# Patient Record
Sex: Female | Born: 1945 | Race: White | Hispanic: Refuse to answer | Marital: Married | Smoking: Never smoker
Health system: Southern US, Community
[De-identification: ages and names within clinical notes are randomized; demographics above are authoritative.]

## PROBLEM LIST (undated history)

## (undated) DIAGNOSIS — G809 Cerebral palsy, unspecified: Secondary | ICD-10-CM

## (undated) DIAGNOSIS — M1990 Unspecified osteoarthritis, unspecified site: Secondary | ICD-10-CM

## (undated) DIAGNOSIS — G43909 Migraine, unspecified, not intractable, without status migrainosus: Secondary | ICD-10-CM

## (undated) DIAGNOSIS — I4891 Unspecified atrial fibrillation: Secondary | ICD-10-CM

## (undated) HISTORY — PX: TEMPOROMANDIBULAR JOINT ARTHROPLASTY: SUR76

## (undated) HISTORY — DX: Migraine, unspecified, not intractable, without status migrainosus: G43.909

## (undated) HISTORY — DX: Cerebral palsy, unspecified: G80.9

## (undated) HISTORY — PX: OTHER SURGICAL HISTORY: SHX169

---

## 1980-03-03 HISTORY — PX: TOTAL ABDOMINAL HYSTERECTOMY W/ BILATERAL SALPINGOOPHORECTOMY: SHX83

## 1987-03-04 HISTORY — PX: CHOLECYSTECTOMY: SHX55

## 2003-03-04 HISTORY — PX: OTHER SURGICAL HISTORY: SHX169

## 2003-06-11 DIAGNOSIS — E7800 Pure hypercholesterolemia, unspecified: Secondary | ICD-10-CM | POA: Insufficient documentation

## 2003-12-12 ENCOUNTER — Ambulatory Visit (HOSPITAL_COMMUNITY): Admission: RE | Admit: 2003-12-12 | Discharge: 2003-12-13 | Payer: Self-pay | Admitting: Surgery

## 2003-12-12 ENCOUNTER — Encounter (INDEPENDENT_AMBULATORY_CARE_PROVIDER_SITE_OTHER): Payer: Self-pay | Admitting: *Deleted

## 2007-03-01 ENCOUNTER — Encounter: Admission: RE | Admit: 2007-03-01 | Discharge: 2007-04-20 | Payer: Self-pay | Admitting: Internal Medicine

## 2010-07-19 NOTE — Op Note (Signed)
NAMESHANTOYA, Holly Mcconnell             ACCOUNT NO.:  [id number]   MEDICAL RECORD NO.:  [id number]          PATIENT TYPE:  OIB   LOCATION:  NA                           FACILITY:  [facility]   PHYSICIAN:  Velora Heckler, M.D.   DATE OF BIRTH:  1946/01/19   DATE OF PROCEDURE:  12/12/2003  DATE OF DISCHARGE:                                 OPERATIVE REPORT   PREOPERATIVE DIAGNOSIS:  Thyroid nodule.   POSTOPERATIVE DIAGNOSIS:  Thyroid nodule.   OPERATION PERFORMED:  Thyroid isthmusectomy.   SURGEON:  Velora Heckler, M.D.   ASSISTANT:  Gita Kudo, M.D.   ANESTHESIA:  General.   ESTIMATED BLOOD LOSS:  Minimal.   PREPARATION:  Betadine.   COMPLICATIONS:  None.   INDICATIONS FOR PROCEDURE:  The patient is a 65 year old white female seen  at the request of Dr. Doran Clay for thyroid nodule.  This has been  present for at least one year.  It has gradually increased in size.  The  patient has been on thyroid hormone replacement.  Thyroid ultrasound  demonstrated a 2.7 cm solid lesion in the thyroid isthmus.  The patient has  developed mild compressive symptoms.  She desires excision.   DESCRIPTION OF PROCEDURE:  The procedure was done in operating room #1 at  the [facility]. [facility].  The patient was brought to the  operating room and placed in supine position on the operating room table.  Following administration of general anesthesia, the patient was positioned  and then prepped and draped in the usual strict aseptic fashion.  After  ascertaining that an adequate level of anesthesia had been obtained, a  Kocher incision was made with a #15 blade.  Dissection was carried down  through subcutaneous tissues and platysma.  Hemostasis was obtained with the  electrocautery.  Skin flaps were developed cephalad and caudad.  A Mahorner  self-retaining retractor was placed for exposure.  Strap muscles were  incised in the midline.  Nodule was located centrally in the middle  portion  of the isthmus.  Inferior venous tributaries to the isthmus were divided  between small Ligaclips.  The inferior edge of the isthmus was defined from  the left lobe to the right lobe.  Dissection was continued behind the  isthmus elevating it off of the underlying trachea.  Superior margin of the  isthmus was also defined by dissecting it out with the electrocautery.  There was no pyramidal lobe.  Dissection was carried behind the isthmus down  on the trachea providing for full mobilization of the nodule and surrounding  tissue.  Hemostats were placed two on the right, two on the left on either  side of the nodule so as to occlude the entire isthmus.  Using a 15 blade,  the isthmus was then excised along the hemostats.  A margin was obtained  around the nodule.  The entire isthmus was submitted with the large nodule  to pathology for frozen section.  Dr. Kieth Brightly confirmed a follicular  lesion which she favors being a hyperplastic nodule.  There is no sign of  malignancy.  Both the left and left and right lobes of the thyroid were  normal without further nodules or abnormality.  Therefore, the hemostats on  each lobe were suture ligated with 3-0 Vicryl suture ligatures.  Good  hemostasis was noted.  Surgicel was placed over the cut surface of the lobes  bilaterally.  Good hemostasis was achieved.  Strap muscles were  reapproximated in the midline with interrupted 3-0 Vicryl sutures.  Platysma  was closed with interrupted 3-0 Vicryl sutures.  Skin edges were  reapproximated with a running 4-0 Vicryl subcuticular suture.  Wound was  washed and dried and benzoin and Steri-Strips were applied.  Sterile  dressings were applied.  The patient was awakened from anesthesia and  brought to the recovery room in stable condition.  The patient tolerated the  procedure well.       TMG/MEDQ  D:  12/12/2003  T:  12/12/2003  Job:  [id number]   cc:   Al Decant. Janey Greaser, MD  [address]  [place]  [place] [address]  Fax: [phone]

## 2010-10-22 ENCOUNTER — Ambulatory Visit: Payer: BC Managed Care – PPO | Attending: Internal Medicine | Admitting: Physical Therapy

## 2010-10-22 DIAGNOSIS — R269 Unspecified abnormalities of gait and mobility: Secondary | ICD-10-CM | POA: Insufficient documentation

## 2010-10-22 DIAGNOSIS — R293 Abnormal posture: Secondary | ICD-10-CM | POA: Insufficient documentation

## 2010-10-22 DIAGNOSIS — IMO0001 Reserved for inherently not codable concepts without codable children: Secondary | ICD-10-CM | POA: Insufficient documentation

## 2010-10-28 ENCOUNTER — Ambulatory Visit: Payer: BC Managed Care – PPO | Admitting: Physical Therapy

## 2010-10-30 ENCOUNTER — Ambulatory Visit: Payer: BC Managed Care – PPO | Admitting: Physical Therapy

## 2010-11-06 ENCOUNTER — Ambulatory Visit
Payer: BC Managed Care – PPO | Attending: Internal Medicine | Admitting: Rehabilitative and Restorative Service Providers"

## 2010-11-06 DIAGNOSIS — R293 Abnormal posture: Secondary | ICD-10-CM | POA: Insufficient documentation

## 2010-11-06 DIAGNOSIS — R269 Unspecified abnormalities of gait and mobility: Secondary | ICD-10-CM | POA: Insufficient documentation

## 2010-11-06 DIAGNOSIS — IMO0001 Reserved for inherently not codable concepts without codable children: Secondary | ICD-10-CM | POA: Insufficient documentation

## 2010-11-08 ENCOUNTER — Ambulatory Visit: Payer: BC Managed Care – PPO | Admitting: Physical Therapy

## 2010-11-11 ENCOUNTER — Ambulatory Visit: Payer: BC Managed Care – PPO | Admitting: Physical Therapy

## 2010-11-13 ENCOUNTER — Ambulatory Visit: Payer: BC Managed Care – PPO | Admitting: Physical Therapy

## 2010-11-18 ENCOUNTER — Ambulatory Visit: Payer: BC Managed Care – PPO | Admitting: Physical Therapy

## 2010-11-20 ENCOUNTER — Ambulatory Visit: Payer: Self-pay | Admitting: Physical Therapy

## 2010-11-20 ENCOUNTER — Ambulatory Visit: Payer: BC Managed Care – PPO | Admitting: Physical Therapy

## 2010-11-25 ENCOUNTER — Ambulatory Visit: Payer: BC Managed Care – PPO | Admitting: Physical Therapy

## 2010-11-27 ENCOUNTER — Ambulatory Visit: Payer: BC Managed Care – PPO | Admitting: Physical Therapy

## 2010-12-04 ENCOUNTER — Ambulatory Visit: Payer: BC Managed Care – PPO | Attending: Internal Medicine | Admitting: Physical Therapy

## 2010-12-04 DIAGNOSIS — IMO0001 Reserved for inherently not codable concepts without codable children: Secondary | ICD-10-CM | POA: Insufficient documentation

## 2010-12-04 DIAGNOSIS — R269 Unspecified abnormalities of gait and mobility: Secondary | ICD-10-CM | POA: Insufficient documentation

## 2010-12-04 DIAGNOSIS — R293 Abnormal posture: Secondary | ICD-10-CM | POA: Insufficient documentation

## 2010-12-06 ENCOUNTER — Ambulatory Visit: Payer: BC Managed Care – PPO | Admitting: Physical Therapy

## 2010-12-09 ENCOUNTER — Ambulatory Visit: Payer: BC Managed Care – PPO | Admitting: Physical Therapy

## 2011-12-02 ENCOUNTER — Other Ambulatory Visit: Payer: Self-pay | Admitting: Dermatology

## 2014-04-28 ENCOUNTER — Other Ambulatory Visit: Payer: Self-pay

## 2014-04-28 DIAGNOSIS — Z1231 Encounter for screening mammogram for malignant neoplasm of breast: Secondary | ICD-10-CM

## 2014-05-03 ENCOUNTER — Ambulatory Visit
Admission: RE | Admit: 2014-05-03 | Discharge: 2014-05-03 | Disposition: A | Discharge status: Home / Self Care | Payer: PPO | Source: Ambulatory Visit

## 2014-05-03 DIAGNOSIS — Z1231 Encounter for screening mammogram for malignant neoplasm of breast: Secondary | ICD-10-CM

## 2015-03-22 DIAGNOSIS — M25561 Pain in right knee: Secondary | ICD-10-CM | POA: Diagnosis not present

## 2015-05-07 DIAGNOSIS — M25561 Pain in right knee: Secondary | ICD-10-CM | POA: Diagnosis not present

## 2015-05-24 DIAGNOSIS — D2262 Melanocytic nevi of left upper limb, including shoulder: Secondary | ICD-10-CM | POA: Diagnosis not present

## 2015-05-24 DIAGNOSIS — D2272 Melanocytic nevi of left lower limb, including hip: Secondary | ICD-10-CM | POA: Diagnosis not present

## 2015-05-24 DIAGNOSIS — D2271 Melanocytic nevi of right lower limb, including hip: Secondary | ICD-10-CM | POA: Diagnosis not present

## 2015-05-24 DIAGNOSIS — D1801 Hemangioma of skin and subcutaneous tissue: Secondary | ICD-10-CM | POA: Diagnosis not present

## 2015-05-24 DIAGNOSIS — D2261 Melanocytic nevi of right upper limb, including shoulder: Secondary | ICD-10-CM | POA: Diagnosis not present

## 2015-05-24 DIAGNOSIS — L309 Dermatitis, unspecified: Secondary | ICD-10-CM | POA: Diagnosis not present

## 2015-05-24 DIAGNOSIS — L818 Other specified disorders of pigmentation: Secondary | ICD-10-CM | POA: Diagnosis not present

## 2015-05-24 DIAGNOSIS — L821 Other seborrheic keratosis: Secondary | ICD-10-CM | POA: Diagnosis not present

## 2015-07-26 DIAGNOSIS — G800 Spastic quadriplegic cerebral palsy: Secondary | ICD-10-CM | POA: Diagnosis not present

## 2015-07-26 DIAGNOSIS — M4726 Other spondylosis with radiculopathy, lumbar region: Secondary | ICD-10-CM | POA: Diagnosis not present

## 2015-07-26 DIAGNOSIS — M4722 Other spondylosis with radiculopathy, cervical region: Secondary | ICD-10-CM | POA: Diagnosis not present

## 2015-08-07 DIAGNOSIS — H25813 Combined forms of age-related cataract, bilateral: Secondary | ICD-10-CM | POA: Diagnosis not present

## 2015-08-07 DIAGNOSIS — G43909 Migraine, unspecified, not intractable, without status migrainosus: Secondary | ICD-10-CM | POA: Diagnosis not present

## 2015-08-07 DIAGNOSIS — H04123 Dry eye syndrome of bilateral lacrimal glands: Secondary | ICD-10-CM | POA: Diagnosis not present

## 2015-08-07 DIAGNOSIS — H52223 Regular astigmatism, bilateral: Secondary | ICD-10-CM | POA: Diagnosis not present

## 2015-08-07 DIAGNOSIS — H5203 Hypermetropia, bilateral: Secondary | ICD-10-CM | POA: Diagnosis not present

## 2015-08-07 DIAGNOSIS — H524 Presbyopia: Secondary | ICD-10-CM | POA: Diagnosis not present

## 2015-08-07 DIAGNOSIS — H31093 Other chorioretinal scars, bilateral: Secondary | ICD-10-CM | POA: Diagnosis not present

## 2015-10-01 ENCOUNTER — Other Ambulatory Visit: Payer: Self-pay | Admitting: Family Medicine

## 2015-10-01 DIAGNOSIS — Z1231 Encounter for screening mammogram for malignant neoplasm of breast: Secondary | ICD-10-CM

## 2015-10-02 ENCOUNTER — Other Ambulatory Visit: Payer: Self-pay | Admitting: Family Medicine

## 2015-10-02 DIAGNOSIS — M8580 Other specified disorders of bone density and structure, unspecified site: Secondary | ICD-10-CM

## 2015-10-02 DIAGNOSIS — T380X5A Adverse effect of glucocorticoids and synthetic analogues, initial encounter: Principal | ICD-10-CM

## 2015-10-10 ENCOUNTER — Ambulatory Visit
Admission: RE | Admit: 2015-10-10 | Discharge: 2015-10-10 | Disposition: A | Discharge status: Home / Self Care | Payer: PPO | Source: Ambulatory Visit | Attending: Family Medicine | Admitting: Family Medicine

## 2015-10-10 DIAGNOSIS — T380X5A Adverse effect of glucocorticoids and synthetic analogues, initial encounter: Principal | ICD-10-CM

## 2015-10-10 DIAGNOSIS — Z780 Asymptomatic menopausal state: Secondary | ICD-10-CM | POA: Diagnosis not present

## 2015-10-10 DIAGNOSIS — Z1231 Encounter for screening mammogram for malignant neoplasm of breast: Secondary | ICD-10-CM | POA: Diagnosis not present

## 2015-10-10 DIAGNOSIS — M8580 Other specified disorders of bone density and structure, unspecified site: Secondary | ICD-10-CM

## 2015-10-10 DIAGNOSIS — M8589 Other specified disorders of bone density and structure, multiple sites: Secondary | ICD-10-CM | POA: Diagnosis not present

## 2015-12-17 DIAGNOSIS — Z1159 Encounter for screening for other viral diseases: Secondary | ICD-10-CM | POA: Diagnosis not present

## 2015-12-17 DIAGNOSIS — E039 Hypothyroidism, unspecified: Secondary | ICD-10-CM | POA: Diagnosis not present

## 2015-12-17 DIAGNOSIS — E7800 Pure hypercholesterolemia, unspecified: Secondary | ICD-10-CM | POA: Diagnosis not present

## 2016-01-02 DIAGNOSIS — M859 Disorder of bone density and structure, unspecified: Secondary | ICD-10-CM | POA: Diagnosis not present

## 2016-01-02 DIAGNOSIS — Z23 Encounter for immunization: Secondary | ICD-10-CM | POA: Diagnosis not present

## 2016-01-02 DIAGNOSIS — E039 Hypothyroidism, unspecified: Secondary | ICD-10-CM | POA: Diagnosis not present

## 2016-01-02 DIAGNOSIS — E7800 Pure hypercholesterolemia, unspecified: Secondary | ICD-10-CM | POA: Diagnosis not present

## 2016-01-02 DIAGNOSIS — G43909 Migraine, unspecified, not intractable, without status migrainosus: Secondary | ICD-10-CM | POA: Diagnosis not present

## 2016-01-02 DIAGNOSIS — Z0000 Encounter for general adult medical examination without abnormal findings: Secondary | ICD-10-CM | POA: Diagnosis not present

## 2016-01-02 DIAGNOSIS — G809 Cerebral palsy, unspecified: Secondary | ICD-10-CM | POA: Diagnosis not present

## 2016-06-30 DIAGNOSIS — D2272 Melanocytic nevi of left lower limb, including hip: Secondary | ICD-10-CM | POA: Diagnosis not present

## 2016-06-30 DIAGNOSIS — D225 Melanocytic nevi of trunk: Secondary | ICD-10-CM | POA: Diagnosis not present

## 2016-06-30 DIAGNOSIS — D1801 Hemangioma of skin and subcutaneous tissue: Secondary | ICD-10-CM | POA: Diagnosis not present

## 2016-06-30 DIAGNOSIS — L821 Other seborrheic keratosis: Secondary | ICD-10-CM | POA: Diagnosis not present

## 2016-06-30 DIAGNOSIS — L814 Other melanin hyperpigmentation: Secondary | ICD-10-CM | POA: Diagnosis not present

## 2016-06-30 DIAGNOSIS — L720 Epidermal cyst: Secondary | ICD-10-CM | POA: Diagnosis not present

## 2016-06-30 DIAGNOSIS — D2261 Melanocytic nevi of right upper limb, including shoulder: Secondary | ICD-10-CM | POA: Diagnosis not present

## 2016-06-30 DIAGNOSIS — D2262 Melanocytic nevi of left upper limb, including shoulder: Secondary | ICD-10-CM | POA: Diagnosis not present

## 2016-06-30 DIAGNOSIS — D2271 Melanocytic nevi of right lower limb, including hip: Secondary | ICD-10-CM | POA: Diagnosis not present

## 2016-09-02 DIAGNOSIS — H524 Presbyopia: Secondary | ICD-10-CM | POA: Diagnosis not present

## 2016-09-02 DIAGNOSIS — H31093 Other chorioretinal scars, bilateral: Secondary | ICD-10-CM | POA: Diagnosis not present

## 2016-09-02 DIAGNOSIS — H25813 Combined forms of age-related cataract, bilateral: Secondary | ICD-10-CM | POA: Diagnosis not present

## 2016-09-02 DIAGNOSIS — H52223 Regular astigmatism, bilateral: Secondary | ICD-10-CM | POA: Diagnosis not present

## 2016-09-02 DIAGNOSIS — H04123 Dry eye syndrome of bilateral lacrimal glands: Secondary | ICD-10-CM | POA: Diagnosis not present

## 2016-09-02 DIAGNOSIS — H5203 Hypermetropia, bilateral: Secondary | ICD-10-CM | POA: Diagnosis not present

## 2016-09-02 DIAGNOSIS — G43909 Migraine, unspecified, not intractable, without status migrainosus: Secondary | ICD-10-CM | POA: Diagnosis not present

## 2016-11-26 ENCOUNTER — Other Ambulatory Visit: Payer: Self-pay | Admitting: Family Medicine

## 2016-11-26 DIAGNOSIS — Z1231 Encounter for screening mammogram for malignant neoplasm of breast: Secondary | ICD-10-CM

## 2016-12-22 ENCOUNTER — Ambulatory Visit
Admission: RE | Admit: 2016-12-22 | Discharge: 2016-12-22 | Disposition: A | Discharge status: Home / Self Care | Payer: Self-pay | Source: Ambulatory Visit | Attending: Family Medicine | Admitting: Family Medicine

## 2016-12-22 DIAGNOSIS — Z1231 Encounter for screening mammogram for malignant neoplasm of breast: Secondary | ICD-10-CM | POA: Diagnosis not present

## 2017-01-05 DIAGNOSIS — G809 Cerebral palsy, unspecified: Secondary | ICD-10-CM | POA: Diagnosis not present

## 2017-01-05 DIAGNOSIS — E039 Hypothyroidism, unspecified: Secondary | ICD-10-CM | POA: Diagnosis not present

## 2017-01-05 DIAGNOSIS — Z0000 Encounter for general adult medical examination without abnormal findings: Secondary | ICD-10-CM | POA: Diagnosis not present

## 2017-01-05 DIAGNOSIS — G43909 Migraine, unspecified, not intractable, without status migrainosus: Secondary | ICD-10-CM | POA: Diagnosis not present

## 2017-01-05 DIAGNOSIS — Z1211 Encounter for screening for malignant neoplasm of colon: Secondary | ICD-10-CM | POA: Diagnosis not present

## 2017-01-05 DIAGNOSIS — Z79899 Other long term (current) drug therapy: Secondary | ICD-10-CM | POA: Diagnosis not present

## 2017-01-05 DIAGNOSIS — E7800 Pure hypercholesterolemia, unspecified: Secondary | ICD-10-CM | POA: Diagnosis not present

## 2017-01-05 DIAGNOSIS — M859 Disorder of bone density and structure, unspecified: Secondary | ICD-10-CM | POA: Diagnosis not present

## 2017-01-05 DIAGNOSIS — Z1389 Encounter for screening for other disorder: Secondary | ICD-10-CM | POA: Diagnosis not present

## 2017-01-19 DIAGNOSIS — Z1211 Encounter for screening for malignant neoplasm of colon: Secondary | ICD-10-CM | POA: Diagnosis not present

## 2017-04-15 DIAGNOSIS — E039 Hypothyroidism, unspecified: Secondary | ICD-10-CM | POA: Diagnosis not present

## 2017-04-15 DIAGNOSIS — Z1211 Encounter for screening for malignant neoplasm of colon: Secondary | ICD-10-CM | POA: Diagnosis not present

## 2017-04-21 ENCOUNTER — Encounter (INDEPENDENT_AMBULATORY_CARE_PROVIDER_SITE_OTHER): Payer: Self-pay | Admitting: Physical Medicine and Rehabilitation

## 2017-04-21 ENCOUNTER — Ambulatory Visit (INDEPENDENT_AMBULATORY_CARE_PROVIDER_SITE_OTHER): Payer: PPO

## 2017-04-21 ENCOUNTER — Ambulatory Visit (INDEPENDENT_AMBULATORY_CARE_PROVIDER_SITE_OTHER): Payer: PPO | Admitting: Physical Medicine and Rehabilitation

## 2017-04-21 VITALS — BP 106/64 | HR 67

## 2017-04-21 DIAGNOSIS — S39012A Strain of muscle, fascia and tendon of lower back, initial encounter: Secondary | ICD-10-CM | POA: Diagnosis not present

## 2017-04-21 DIAGNOSIS — G8929 Other chronic pain: Secondary | ICD-10-CM

## 2017-04-21 DIAGNOSIS — M545 Low back pain: Secondary | ICD-10-CM

## 2017-04-21 DIAGNOSIS — M47816 Spondylosis without myelopathy or radiculopathy, lumbar region: Secondary | ICD-10-CM | POA: Diagnosis not present

## 2017-04-21 NOTE — Progress Notes (Addendum)
Holly Mcconnell - 72 y.o. female MRN [id number]  Date of birth: 1945/06/18  Office Visit Note: Visit Date: 04/21/2017 PCP: Kathyrn Lass, MD Referred by: Kathyrn Lass, MD  Subjective: Chief Complaint  Patient presents with  . Lower Back - Pain  . Right Hip - Pain   HPI: Holly Mcconnell is a 72 year old female with cerebral palsy and history of chronic low back pain and at least in the past some radicular pain on the right.  I have not seen her since 2016.  She was last in this office in 2017 and saw Dr. Paulla Fore.  She comes in today stating she was doing fairly well up until about a month or so ago.  She reported increasing symptoms and she remembered that I told her to take Aleve twice a day for a couple of weeks when it flares up and she did try that and it first it felt better but then the symptoms sort of broke through that.  Reports having a fall about a month ago onto her backside on the right.  Centered pretty much at the right PSIS and lumbosacral junction.  He does not get any referral pattern down the legs.  She says is worse at night and worse with getting up and down from a seated position.  Denies any paresthesias or numbness or tingling.  She denies any left-sided complaints.  She reports some worsening of the symptoms prior to the fall but definitely exacerbation with the fall.  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  Other treatment for her pain or recent physical therapy.  She has not had any imaging recently.  Last MRI of the lumbar spine was in 2013.    Review of Systems  Constitutional: Negative for chills, fever, malaise/fatigue and weight loss.  HENT: Negative for hearing loss and sinus pain.   Eyes: Negative for blurred vision, double vision and photophobia.  Respiratory: Negative for cough and shortness of breath.   Cardiovascular: Negative for chest pain, palpitations and leg swelling.   Gastrointestinal: Negative for abdominal pain, nausea and vomiting.  Genitourinary: Negative for flank pain.  Musculoskeletal: Positive for back pain, falls and joint pain. Negative for myalgias.  Skin: Negative for itching and rash.  Neurological: Negative for tremors, focal weakness and weakness.  Endo/Heme/Allergies: Negative.   Psychiatric/Behavioral: Negative for depression.  All other systems reviewed and are negative.  Otherwise per HPI.  Assessment & Plan: Visit Diagnoses:  1. Chronic right-sided low back pain without sciatica   2. Strain of lumbar region, initial encounter   3. Spondylosis without myelopathy or radiculopathy, lumbar region     Plan: Findings:  Chronic history of low back pain and right radicular complaints now with significant pain in the right PSIS buttock region after fall back side about a month ago.  Will treat this more of a lumbar sprain strain and probably right sacroiliac joint pain.  He does have a positive Patrick sign on that side.  I think the best approach is to continue with the Aleve 2 tablets twice a day for the next 3 weeks as well as regrouping with physical therapy.  She has seen Chief [facility] in the past.  If that just did not seem to help with time and treatment then we would look at probably diagnostic sacroiliac joint injection declared itself to be more radicular  with referral pattern we could look at repeat epidural injection versus repeat MRI.  She does not have any red flag symptoms at this point to warrant an MRI.  X-ray imaging was really unrevealing except for scoliosis and arthritis particularly on the right at L4-5 and L5-S1.    Meds & Orders: No orders of the defined types were placed in this encounter.   Orders Placed This Encounter  Procedures  . XR Lumbar Spine Complete    Follow-up: Return in about 4 weeks (around 05/19/2017) for Recheck spine.   Procedures: No procedures performed  No notes on  file   Clinical History: No specialty comments available.  She reports that  has never smoked. she has never used smokeless tobacco. No results for input(s): HGBA1C, LABURIC in the last 8760 hours.  Objective:  VS:  HT:    WT:   BMI:     BP:106/64  HR:67bpm  TEMP: ( )  RESP:  Physical Exam  Constitutional: She is oriented to person, place, and time. She appears well-developed and well-nourished. No distress.  HENT:  Head: Normocephalic and atraumatic.  Nose: Nose normal.  Mouth/Throat: Oropharynx is clear and moist.  Eyes: Conjunctivae are normal. Pupils are equal, round, and reactive to light.  Neck: Normal range of motion. Neck supple.  Cardiovascular: Regular rhythm and intact distal pulses.  Pulmonary/Chest: Effort normal. No respiratory distress.  Abdominal: She exhibits no distension. There is no guarding.  Musculoskeletal:  Patient ambulates with the help of a rolling walker.  She has somewhat of a scissoring gait.  He does have pain arising from a seated position.  She has a positive Patrick sign on the right and on the left.  Good strength in the lower extremities bilaterally with some spasticity.  Has no pain over the greater trochanters.  She has no hip pain with internal rotation.  Neurological: She is alert and oriented to person, place, and time. She exhibits normal muscle tone. Coordination normal.  Skin: Skin is warm. No rash noted. No erythema.  Psychiatric: She has a normal mood and affect. Her behavior is normal.  Nursing note and vitals reviewed.   Ortho Exam Imaging: Xr Lumbar Spine Complete  Result Date: 04/22/2017 4 view lumbar spine shows right-sided concave scoliosis centered at the L4 vertebral body with some rotational component.  There is facet arthropathy at L4-5 and L5-S1 particularly on the right.  She has a little bit unequal pelvic height with the left little bit lower than the right.  She has mild to moderate degenerative changes of both hips.  She  has no fractures or dislocations.  She has no spondylolysis.  She has no spondylolisthesis.  She has some degenerative disc height loss at L5-S1.   Past Medical/Family/Surgical/Social History: Medications & Allergies reviewed per EMR There are no active problems to display for this patient.  History reviewed. No pertinent past medical history. Family History  Problem Relation Age of Onset  . Breast cancer Maternal Grandmother    History reviewed. No pertinent surgical history. Social History   Occupational History  . Not on file  Tobacco Use  . Smoking status: Never Smoker  . Smokeless tobacco: Never Used  Substance and Sexual Activity  . Alcohol use: Not on file  . Drug use: Not on file  . Sexual activity: Not on file

## 2017-04-21 NOTE — Progress Notes (Deleted)
Lower back pain started coming back about 4 months ago. Was taking Aleve, and that helped. Fell onto back about a month ago, and pain has been worse since then. Worse on the right. Pain does not radiate; no numbness or tingling.

## 2017-04-22 ENCOUNTER — Encounter (INDEPENDENT_AMBULATORY_CARE_PROVIDER_SITE_OTHER): Payer: Self-pay | Admitting: Physical Medicine and Rehabilitation

## 2017-04-27 DIAGNOSIS — M4140 Neuromuscular scoliosis, site unspecified: Secondary | ICD-10-CM | POA: Diagnosis not present

## 2017-04-27 DIAGNOSIS — M545 Low back pain: Secondary | ICD-10-CM | POA: Diagnosis not present

## 2017-04-27 DIAGNOSIS — G809 Cerebral palsy, unspecified: Secondary | ICD-10-CM | POA: Diagnosis not present

## 2017-04-27 DIAGNOSIS — M546 Pain in thoracic spine: Secondary | ICD-10-CM | POA: Diagnosis not present

## 2017-04-28 DIAGNOSIS — R110 Nausea: Secondary | ICD-10-CM | POA: Diagnosis not present

## 2017-04-28 DIAGNOSIS — J069 Acute upper respiratory infection, unspecified: Secondary | ICD-10-CM | POA: Diagnosis not present

## 2017-04-28 DIAGNOSIS — R05 Cough: Secondary | ICD-10-CM | POA: Diagnosis not present

## 2017-04-28 DIAGNOSIS — R42 Dizziness and giddiness: Secondary | ICD-10-CM | POA: Diagnosis not present

## 2017-05-04 DIAGNOSIS — M546 Pain in thoracic spine: Secondary | ICD-10-CM | POA: Diagnosis not present

## 2017-05-04 DIAGNOSIS — M545 Low back pain: Secondary | ICD-10-CM | POA: Diagnosis not present

## 2017-05-04 DIAGNOSIS — M4140 Neuromuscular scoliosis, site unspecified: Secondary | ICD-10-CM | POA: Diagnosis not present

## 2017-05-04 DIAGNOSIS — G809 Cerebral palsy, unspecified: Secondary | ICD-10-CM | POA: Diagnosis not present

## 2017-05-06 DIAGNOSIS — M4140 Neuromuscular scoliosis, site unspecified: Secondary | ICD-10-CM | POA: Diagnosis not present

## 2017-05-06 DIAGNOSIS — M546 Pain in thoracic spine: Secondary | ICD-10-CM | POA: Diagnosis not present

## 2017-05-06 DIAGNOSIS — G809 Cerebral palsy, unspecified: Secondary | ICD-10-CM | POA: Diagnosis not present

## 2017-05-06 DIAGNOSIS — M545 Low back pain: Secondary | ICD-10-CM | POA: Diagnosis not present

## 2017-05-11 DIAGNOSIS — M546 Pain in thoracic spine: Secondary | ICD-10-CM | POA: Diagnosis not present

## 2017-05-11 DIAGNOSIS — G809 Cerebral palsy, unspecified: Secondary | ICD-10-CM | POA: Diagnosis not present

## 2017-05-11 DIAGNOSIS — M545 Low back pain: Secondary | ICD-10-CM | POA: Diagnosis not present

## 2017-05-11 DIAGNOSIS — M4140 Neuromuscular scoliosis, site unspecified: Secondary | ICD-10-CM | POA: Diagnosis not present

## 2017-05-13 DIAGNOSIS — M545 Low back pain: Secondary | ICD-10-CM | POA: Diagnosis not present

## 2017-05-13 DIAGNOSIS — M4140 Neuromuscular scoliosis, site unspecified: Secondary | ICD-10-CM | POA: Diagnosis not present

## 2017-05-13 DIAGNOSIS — M546 Pain in thoracic spine: Secondary | ICD-10-CM | POA: Diagnosis not present

## 2017-05-13 DIAGNOSIS — G809 Cerebral palsy, unspecified: Secondary | ICD-10-CM | POA: Diagnosis not present

## 2017-05-19 DIAGNOSIS — M545 Low back pain: Secondary | ICD-10-CM | POA: Diagnosis not present

## 2017-05-19 DIAGNOSIS — G809 Cerebral palsy, unspecified: Secondary | ICD-10-CM | POA: Diagnosis not present

## 2017-05-19 DIAGNOSIS — M546 Pain in thoracic spine: Secondary | ICD-10-CM | POA: Diagnosis not present

## 2017-05-19 DIAGNOSIS — M4140 Neuromuscular scoliosis, site unspecified: Secondary | ICD-10-CM | POA: Diagnosis not present

## 2017-05-21 DIAGNOSIS — M4140 Neuromuscular scoliosis, site unspecified: Secondary | ICD-10-CM | POA: Diagnosis not present

## 2017-05-21 DIAGNOSIS — G809 Cerebral palsy, unspecified: Secondary | ICD-10-CM | POA: Diagnosis not present

## 2017-05-21 DIAGNOSIS — M545 Low back pain: Secondary | ICD-10-CM | POA: Diagnosis not present

## 2017-05-21 DIAGNOSIS — M546 Pain in thoracic spine: Secondary | ICD-10-CM | POA: Diagnosis not present

## 2017-05-27 ENCOUNTER — Ambulatory Visit (INDEPENDENT_AMBULATORY_CARE_PROVIDER_SITE_OTHER): Payer: Self-pay | Admitting: Physical Medicine and Rehabilitation

## 2017-06-29 DIAGNOSIS — L821 Other seborrheic keratosis: Secondary | ICD-10-CM | POA: Diagnosis not present

## 2017-06-29 DIAGNOSIS — D2272 Melanocytic nevi of left lower limb, including hip: Secondary | ICD-10-CM | POA: Diagnosis not present

## 2017-06-29 DIAGNOSIS — D2261 Melanocytic nevi of right upper limb, including shoulder: Secondary | ICD-10-CM | POA: Diagnosis not present

## 2017-06-29 DIAGNOSIS — D2271 Melanocytic nevi of right lower limb, including hip: Secondary | ICD-10-CM | POA: Diagnosis not present

## 2017-06-29 DIAGNOSIS — D1801 Hemangioma of skin and subcutaneous tissue: Secondary | ICD-10-CM | POA: Diagnosis not present

## 2017-06-29 DIAGNOSIS — D2262 Melanocytic nevi of left upper limb, including shoulder: Secondary | ICD-10-CM | POA: Diagnosis not present

## 2017-07-08 DIAGNOSIS — R05 Cough: Secondary | ICD-10-CM | POA: Diagnosis not present

## 2017-07-08 DIAGNOSIS — E039 Hypothyroidism, unspecified: Secondary | ICD-10-CM | POA: Diagnosis not present

## 2017-09-16 DIAGNOSIS — H52223 Regular astigmatism, bilateral: Secondary | ICD-10-CM | POA: Diagnosis not present

## 2017-09-16 DIAGNOSIS — H524 Presbyopia: Secondary | ICD-10-CM | POA: Diagnosis not present

## 2017-09-16 DIAGNOSIS — H5203 Hypermetropia, bilateral: Secondary | ICD-10-CM | POA: Diagnosis not present

## 2017-09-18 DIAGNOSIS — M7989 Other specified soft tissue disorders: Secondary | ICD-10-CM | POA: Diagnosis not present

## 2017-12-18 ENCOUNTER — Other Ambulatory Visit: Payer: Self-pay | Admitting: Family Medicine

## 2017-12-18 DIAGNOSIS — M8580 Other specified disorders of bone density and structure, unspecified site: Secondary | ICD-10-CM

## 2017-12-18 DIAGNOSIS — R5381 Other malaise: Secondary | ICD-10-CM

## 2017-12-28 ENCOUNTER — Other Ambulatory Visit: Payer: Self-pay | Admitting: Family Medicine

## 2017-12-28 DIAGNOSIS — Z1231 Encounter for screening mammogram for malignant neoplasm of breast: Secondary | ICD-10-CM

## 2017-12-31 ENCOUNTER — Other Ambulatory Visit: Payer: Self-pay | Admitting: Family Medicine

## 2017-12-31 DIAGNOSIS — R5381 Other malaise: Secondary | ICD-10-CM

## 2018-01-01 ENCOUNTER — Other Ambulatory Visit: Payer: Self-pay | Admitting: Family Medicine

## 2018-01-01 DIAGNOSIS — M8580 Other specified disorders of bone density and structure, unspecified site: Secondary | ICD-10-CM

## 2018-01-08 ENCOUNTER — Encounter (INDEPENDENT_AMBULATORY_CARE_PROVIDER_SITE_OTHER): Payer: Self-pay | Admitting: Physical Medicine and Rehabilitation

## 2018-01-08 ENCOUNTER — Ambulatory Visit (INDEPENDENT_AMBULATORY_CARE_PROVIDER_SITE_OTHER): Payer: PPO | Admitting: Physical Medicine and Rehabilitation

## 2018-01-08 VITALS — BP 107/63 | HR 63 | Ht 60.0 in | Wt 115.0 lb

## 2018-01-08 DIAGNOSIS — M5441 Lumbago with sciatica, right side: Secondary | ICD-10-CM

## 2018-01-08 DIAGNOSIS — M7918 Myalgia, other site: Secondary | ICD-10-CM

## 2018-01-08 DIAGNOSIS — G43709 Chronic migraine without aura, not intractable, without status migrainosus: Secondary | ICD-10-CM

## 2018-01-08 DIAGNOSIS — G8929 Other chronic pain: Secondary | ICD-10-CM | POA: Diagnosis not present

## 2018-01-08 DIAGNOSIS — G809 Cerebral palsy, unspecified: Secondary | ICD-10-CM

## 2018-01-08 DIAGNOSIS — M5416 Radiculopathy, lumbar region: Secondary | ICD-10-CM | POA: Diagnosis not present

## 2018-01-08 NOTE — Progress Notes (Signed)
.  Numeric Pain Rating Scale and Functional Assessment Average Pain 7 Pain Right Now 6 My pain is intermittent, dull and aching Pain is worse with: sitting and standing Pain improves with: rest and medication   In the last MONTH (on 0-10 scale) has pain interfered with the following?  1. General activity like being  able to carry out your everyday physical activities such as walking, climbing stairs, carrying groceries, or moving a chair?  Rating(4)  2. Relation with others like being able to carry out your usual social activities and roles such as  activities at home, at work and in your community. Rating(4)  3. Enjoyment of life such that you have  been bothered by emotional problems such as feeling anxious, depressed or irritable?  Rating(3)

## 2018-01-19 ENCOUNTER — Ambulatory Visit
Admission: RE | Admit: 2018-01-19 | Discharge: 2018-01-19 | Disposition: A | Discharge status: Home / Self Care | Payer: PPO | Source: Ambulatory Visit | Attending: Physical Medicine and Rehabilitation | Admitting: Physical Medicine and Rehabilitation

## 2018-01-19 DIAGNOSIS — M5416 Radiculopathy, lumbar region: Secondary | ICD-10-CM

## 2018-01-19 DIAGNOSIS — M48061 Spinal stenosis, lumbar region without neurogenic claudication: Secondary | ICD-10-CM | POA: Diagnosis not present

## 2018-01-20 DIAGNOSIS — Z1211 Encounter for screening for malignant neoplasm of colon: Secondary | ICD-10-CM | POA: Diagnosis not present

## 2018-02-04 ENCOUNTER — Ambulatory Visit (INDEPENDENT_AMBULATORY_CARE_PROVIDER_SITE_OTHER): Payer: Self-pay

## 2018-02-04 ENCOUNTER — Ambulatory Visit (INDEPENDENT_AMBULATORY_CARE_PROVIDER_SITE_OTHER): Payer: PPO | Admitting: Physical Medicine and Rehabilitation

## 2018-02-04 ENCOUNTER — Encounter (INDEPENDENT_AMBULATORY_CARE_PROVIDER_SITE_OTHER): Payer: Self-pay | Admitting: Physical Medicine and Rehabilitation

## 2018-02-04 VITALS — BP 111/65 | HR 61 | Temp 97.4°F

## 2018-02-04 DIAGNOSIS — M5116 Intervertebral disc disorders with radiculopathy, lumbar region: Secondary | ICD-10-CM | POA: Diagnosis not present

## 2018-02-04 DIAGNOSIS — M5416 Radiculopathy, lumbar region: Secondary | ICD-10-CM | POA: Diagnosis not present

## 2018-02-04 DIAGNOSIS — M48062 Spinal stenosis, lumbar region with neurogenic claudication: Secondary | ICD-10-CM

## 2018-02-04 MED ORDER — METHYLPREDNISOLONE ACETATE 80 MG/ML IJ SUSP
80.0000 mg | Freq: Once | INTRAMUSCULAR | Status: AC
  Administered 2018-02-04: 80 mg

## 2018-02-04 NOTE — Patient Instructions (Signed)

## 2018-02-04 NOTE — Progress Notes (Signed)
 .  Numeric Pain Rating Scale and Functional Assessment Average Pain 8   In the last MONTH (on 0-10 scale) has pain interfered with the following?  1. General activity like being  able to carry out your everyday physical activities such as walking, climbing stairs, carrying groceries, or moving a chair?  Rating(5)   +Driver, -BT, -Dye Allergies.  

## 2018-02-10 ENCOUNTER — Ambulatory Visit: Payer: Self-pay

## 2018-02-10 ENCOUNTER — Encounter: Payer: Self-pay | Admitting: Cardiology

## 2018-02-10 DIAGNOSIS — R002 Palpitations: Secondary | ICD-10-CM | POA: Diagnosis not present

## 2018-02-10 DIAGNOSIS — Z0000 Encounter for general adult medical examination without abnormal findings: Secondary | ICD-10-CM | POA: Diagnosis not present

## 2018-02-10 DIAGNOSIS — E7800 Pure hypercholesterolemia, unspecified: Secondary | ICD-10-CM | POA: Diagnosis not present

## 2018-02-10 DIAGNOSIS — E039 Hypothyroidism, unspecified: Secondary | ICD-10-CM | POA: Diagnosis not present

## 2018-02-10 DIAGNOSIS — Z23 Encounter for immunization: Secondary | ICD-10-CM | POA: Diagnosis not present

## 2018-02-10 DIAGNOSIS — G809 Cerebral palsy, unspecified: Secondary | ICD-10-CM | POA: Diagnosis not present

## 2018-02-12 ENCOUNTER — Telehealth: Payer: Self-pay | Admitting: *Deleted

## 2018-02-12 NOTE — Telephone Encounter (Signed)
Referral sent to scheduling and notes on file.

## 2018-02-15 ENCOUNTER — Ambulatory Visit
Admission: RE | Admit: 2018-02-15 | Discharge: 2018-02-15 | Disposition: A | Discharge status: Home / Self Care | Payer: PPO | Source: Ambulatory Visit | Attending: Family Medicine | Admitting: Family Medicine

## 2018-02-15 ENCOUNTER — Other Ambulatory Visit: Payer: Self-pay | Admitting: Family Medicine

## 2018-02-15 DIAGNOSIS — Z1231 Encounter for screening mammogram for malignant neoplasm of breast: Secondary | ICD-10-CM

## 2018-02-22 ENCOUNTER — Telehealth (INDEPENDENT_AMBULATORY_CARE_PROVIDER_SITE_OTHER): Payer: Self-pay | Admitting: *Deleted

## 2018-03-01 ENCOUNTER — Encounter (INDEPENDENT_AMBULATORY_CARE_PROVIDER_SITE_OTHER): Payer: Self-pay | Admitting: Physical Medicine and Rehabilitation

## 2018-03-01 DIAGNOSIS — G43909 Migraine, unspecified, not intractable, without status migrainosus: Secondary | ICD-10-CM | POA: Insufficient documentation

## 2018-03-01 DIAGNOSIS — M5416 Radiculopathy, lumbar region: Secondary | ICD-10-CM | POA: Insufficient documentation

## 2018-03-01 DIAGNOSIS — M48062 Spinal stenosis, lumbar region with neurogenic claudication: Secondary | ICD-10-CM | POA: Insufficient documentation

## 2018-03-01 DIAGNOSIS — G809 Cerebral palsy, unspecified: Secondary | ICD-10-CM | POA: Insufficient documentation

## 2018-03-01 DIAGNOSIS — M5116 Intervertebral disc disorders with radiculopathy, lumbar region: Secondary | ICD-10-CM | POA: Insufficient documentation

## 2018-03-01 NOTE — Progress Notes (Signed)
Holly Mcconnell - 72 y.o. female MRN [id number]  Date of birth: 05/03/1945  Office Visit Note: Visit Date: 02/04/2018 PCP: Cari Caraway, MD Referred by: Dineen Kid, MD  Subjective: Chief Complaint  Patient presents with  . Lower Back - Pain   HPI:  [place] Holly Mcconnell is a 72 y.o. female who comes in today For review of lumbar spine MRI and right L4-5 interlaminar epidural steroid injection.  Patient continues to have right radicular leg pain from prior evaluation.  Nothing really has changed.  Reports pain in the right hip and thigh.  8 out of 10 pain.  New MRI shows worsening L4-5 spondylosis and stenosis now with moderate stenosis at this level with lateral recess narrowing and broad increased disc bulging.  MRI was shown to the patient and gone over with spine models.  ROS Otherwise per HPI.  Assessment & Plan: Visit Diagnoses:  1. Lumbar radiculopathy   2. Radiculopathy due to lumbar intervertebral disc disorder   3. Spinal stenosis of lumbar region with neurogenic claudication     Plan: No additional findings.   Meds & Orders:  Meds ordered this encounter  Medications  . methylPREDNISolone acetate (DEPO-MEDROL) injection 80 mg    Orders Placed This Encounter  Procedures  . XR C-ARM NO REPORT  . Epidural Steroid injection    Follow-up: Return if symptoms worsen or fail to improve.   Procedures: No procedures performed  Lumbar Epidural Steroid Injection - Interlaminar Approach with Fluoroscopic Guidance  Patient: Holly Mcconnell      Date of Birth: [birth year] MRN: [id number] PCP: Cari Caraway, MD      Visit Date: 02/04/2018   Universal Protocol:     Consent Given By: the patient  Position: PRONE  Additional Comments: Vital signs were monitored before and after the procedure. Patient was prepped and draped in the usual sterile fashion. The correct patient, procedure, and site was verified.   Injection Procedure Details:  Procedure  Site One Meds Administered:  Meds ordered this encounter  Medications  . methylPREDNISolone acetate (DEPO-MEDROL) injection 80 mg     Laterality: Right  Location/Site:  L4-L5  Needle size: 20 G  Needle type: Tuohy  Needle Placement: Paramedian epidural  Findings:   -Comments: Excellent flow of contrast into the epidural space.  Procedure Details: Using a paramedian approach from the side mentioned above, the region overlying the inferior lamina was localized under fluoroscopic visualization and the soft tissues overlying this structure were infiltrated with 4 ml. of 1% Lidocaine without Epinephrine. The Tuohy needle was inserted into the epidural space using a paramedian approach.   The epidural space was localized using loss of resistance along with lateral and bi-planar fluoroscopic views.  After negative aspirate for air, blood, and CSF, a 2 ml. volume of Isovue-250 was injected into the epidural space and the flow of contrast was observed. Radiographs were obtained for documentation purposes.    The injectate was administered into the level noted above.   Additional Comments:  The patient tolerated the procedure well Dressing: Band-Aid    Post-procedure details: Patient was observed during the procedure. Post-procedure instructions were reviewed.  Patient left the clinic in stable condition.   Clinical History: MRI LUMBAR SPINE WITHOUT CONTRAST  TECHNIQUE: Multiplanar, multisequence MR imaging of the lumbar spine was performed. No intravenous contrast was administered.  COMPARISON:  12/15/2011  FINDINGS: Segmentation:  Standard.  Alignment:  Mild thoracolumbar dextroscoliosis.  No listhesis.  Vertebrae: No fracture or suspicious osseous lesion.  Small T11 superior endplate Schmorl's node. Decreased right-sided posterior element edema at L3. Minimal degenerative endplate edema on the right at L4-5.  Conus medullaris and cauda equina: Conus extends to  the L2 level. Conus and cauda equina appear normal.  Paraspinal and other soft tissues: Unremarkable.  Disc levels:  Disc desiccation throughout the lumbar spine. Progressive moderate disc space narrowing at L4-5 and L5-S1 and mild narrowing at L3-4.  T12-L1: Negative.  L1-2: Minimal ligamentum flavum hypertrophy without disc herniation or stenosis.  L2-3: New minimal disc bulging and ligamentum flavum hypertrophy without stenosis.  L3-4: Mild disc bulging slightly greater to the left and mild facet and ligamentum flavum hypertrophy result in mild spinal stenosis and mild right lateral recess stenosis, slightly progressed from prior. No significant neural foraminal stenosis.  L4-5: Disc bulging greater to the right and moderate facet and ligamentum flavum hypertrophy result in moderate spinal stenosis, moderate right and mild left lateral recess stenosis, and mild right neural foraminal stenosis, mildly progressed from prior.  L5-S1: Disc bulging, small superimposed central disc extrusion, and moderate facet and ligamentum flavum hypertrophy result in mild spinal stenosis, moderate bilateral lateral recess stenosis, and mild bilateral neural foraminal stenosis. Stenosis has progressed, particularly in the lateral recesses with potential impingement of both S1 nerve roots.  IMPRESSION: 1. Progressive lower lumbar disc and facet degeneration with moderate spinal and right lateral stenosis at L4-5. 2. Moderate bilateral lateral recess stenosis at L5-S1. 3. Mild spinal stenosis at L3-4.   Electronically Signed   By: Logan Bores M.D.   On: 01/19/2018 09:33     Objective:  VS:  HT:    WT:   BMI:     BP:111/65  HR:61bpm  TEMP:(!) 97.4 F (36.3 C)(Oral)  RESP:  Physical Exam  Ortho Exam Imaging: No results found.

## 2018-03-01 NOTE — Telephone Encounter (Signed)
Pt scheduled 03/15/18 with driver.

## 2018-03-01 NOTE — Progress Notes (Signed)
Holly Mcconnell - 72 y.o. female MRN [id number]  Date of birth: 01-26-1946  Office Visit Note: Visit Date: 01/08/2018 PCP: Holly Caraway, MD Referred by: Holly Lass, MD  Subjective: Chief Complaint  Patient presents with  . Lower Back - Pain  . Right Leg - Pain   HPI: Holly Mcconnell is a 72 y.o. female who comes in today For evaluation management of worsening low back pain and right radicular leg pain.  Patient is someone that I am seen on rare occasions but over the last several years and know her story quite well.  She has cerebral palsy and history of chronic low back pain and at least in the past some radicular pain on the right.  The last time I saw her was in February of this past year where it was more buttock pain we did send her to physical therapy with [facility] physical therapy.  Prior to that she saw Dr. Teresa Mcconnell for evaluation and management.  She comes in today stating she was doing fairly well up until about a month or so ago.   She reports onset of right hip and leg pain without any specific injury.  She does have falls from time to time to balance difficulties with her cerebral palsy.  She has had no major trauma.  Pain is centered pretty much at the right PSIS and lumbosacral junction.  He does not get any referral pattern down the legs.  She says is worse at night and worse with getting up and down from a seated position.  She reports it can also be worse with prolonged sitting.  Her average pain is 7 out of 10.  This is intermittent dull and aching pain.  Denies any paresthesias or numbness or tingling.  She denies any left-sided complaints.  She does continue to try to work out and has had a trainer working with her as well as regrouping with a physical therapist in the past.  She has not had any imaging recently.  Last MRI of the lumbar spine was in 2013.  Appropriate Use Addendum Patient was evaluated for low back pain and according to appropriate  use criteria (did not) receive imaging tests.   Review of Systems  Constitutional: Negative for chills, fever, malaise/fatigue and weight loss.  HENT: Negative for hearing loss and sinus pain.   Eyes: Negative for blurred vision, double vision and photophobia.  Respiratory: Negative for cough and shortness of breath.   Cardiovascular: Negative for chest pain, palpitations and leg swelling.  Gastrointestinal: Negative for abdominal pain, nausea and vomiting.  Genitourinary: Negative for flank pain.  Musculoskeletal: Positive for back pain and joint pain. Negative for myalgias.  Skin: Negative for itching and rash.  Neurological: Negative for tremors, focal weakness and weakness.  Endo/Heme/Allergies: Negative.   Psychiatric/Behavioral: Negative for depression.  All other systems reviewed and are negative.  Otherwise per HPI.  Assessment & Plan: Visit Diagnoses:  1. Lumbar radiculopathy   2. Chronic bilateral low back pain with right-sided sciatica   3. Myofascial pain syndrome   4. Cerebral palsy, unspecified type ([place])   5. Chronic migraine without aura without status migrainosus, not intractable     Plan: Findings:  Chronic worsening severe at times right radicular leg pain which is clearly radicular at this point.  In February it was more centered over the PSIS and there was some question about whether this was piriformis syndrome versus sacroiliac joint versus nerve root irritation.  I think  the best approach at this point is to repeat MRI of the lumbar spine as this had not been done since 2013.  She appears to have may be clinical symptoms that could be consistent with a disc herniation at this point.  She will continue with the Aleve twice a day temporarily.  We will see her back after the MRI.    Meds & Orders: No orders of the defined types were placed in this encounter.   Orders Placed This Encounter  Procedures  . MR LUMBAR SPINE WO CONTRAST    Follow-up: Return for  MRI review after completion.   Procedures: No procedures performed  No notes on file   Clinical History: MRI LUMBAR SPINE WITHOUT CONTRAST  TECHNIQUE: Multiplanar, multisequence MR imaging of the lumbar spine was performed. No intravenous contrast was administered.  COMPARISON:  12/15/2011  FINDINGS: Segmentation:  Standard.  Alignment:  Mild thoracolumbar dextroscoliosis.  No listhesis.  Vertebrae: No fracture or suspicious osseous lesion. Small T11 superior endplate Schmorl's node. Decreased right-sided posterior element edema at L3. Minimal degenerative endplate edema on the right at L4-5.  Conus medullaris and cauda equina: Conus extends to the L2 level. Conus and cauda equina appear normal.  Paraspinal and other soft tissues: Unremarkable.  Disc levels:  Disc desiccation throughout the lumbar spine. Progressive moderate disc space narrowing at L4-5 and L5-S1 and mild narrowing at L3-4.  T12-L1: Negative.  L1-2: Minimal ligamentum flavum hypertrophy without disc herniation or stenosis.  L2-3: New minimal disc bulging and ligamentum flavum hypertrophy without stenosis.  L3-4: Mild disc bulging slightly greater to the left and mild facet and ligamentum flavum hypertrophy result in mild spinal stenosis and mild right lateral recess stenosis, slightly progressed from prior. No significant neural foraminal stenosis.  L4-5: Disc bulging greater to the right and moderate facet and ligamentum flavum hypertrophy result in moderate spinal stenosis, moderate right and mild left lateral recess stenosis, and mild right neural foraminal stenosis, mildly progressed from prior.  L5-S1: Disc bulging, small superimposed central disc extrusion, and moderate facet and ligamentum flavum hypertrophy result in mild spinal stenosis, moderate bilateral lateral recess stenosis, and mild bilateral neural foraminal stenosis. Stenosis has progressed, particularly in the  lateral recesses with potential impingement of both S1 nerve roots.  IMPRESSION: 1. Progressive lower lumbar disc and facet degeneration with moderate spinal and right lateral stenosis at L4-5. 2. Moderate bilateral lateral recess stenosis at L5-S1. 3. Mild spinal stenosis at L3-4.   Electronically Signed   By: Logan Bores M.D.   On: 01/19/2018 09:33   She reports that she has never smoked. She has never used smokeless tobacco. No results for input(s): HGBA1C, LABURIC in the last 8760 hours.  Objective:  VS:  HT:5' (152.4 cm)   WT:115 lb (52.2 kg)  BMI:22.46    BP:107/63  HR:63bpm  TEMP: ( )  RESP:100 % Physical Exam Vitals signs and nursing note reviewed.  Constitutional:      General: She is not in acute distress.    Appearance: Normal appearance. She is well-developed and normal weight. She is not ill-appearing.  HENT:     Head: Normocephalic and atraumatic.     Nose: Nose normal.     Mouth/Throat:     Mouth: Mucous membranes are moist.     Pharynx: Oropharynx is clear.  Eyes:     Conjunctiva/sclera: Conjunctivae normal.     Pupils: Pupils are equal, round, and reactive to light.  Neck:     Musculoskeletal: Normal  range of motion and neck supple.  Cardiovascular:     Rate and Rhythm: Regular rhythm.  Pulmonary:     Effort: Pulmonary effort is normal. No respiratory distress.  Abdominal:     General: There is no distension.     Palpations: Abdomen is soft.     Tenderness: There is no guarding.  Musculoskeletal:     Right lower leg: No edema.     Left lower leg: No edema.     Comments: Patient is slow to rise from a seated position and does have pain of the low back with extension and facet loading.  She does have pain of the right PSIS.  No pain over the right greater trochanter no pain with hip rotation.  She does have a positive slump test on the right.  She has good distal strength.  Skin:    General: Skin is warm and dry.     Findings: No erythema or  rash.  Neurological:     General: No focal deficit present.     Mental Status: She is alert and oriented to person, place, and time.     Motor: No abnormal muscle tone.     Coordination: Coordination abnormal.     Gait: Gait abnormal.     Comments: Scissoring gait Duda cerebral palsy  Psychiatric:        Mood and Affect: Mood normal.        Behavior: Behavior normal.        Thought Content: Thought content normal.     Ortho Exam Imaging: No results found.  Past Medical/Family/Surgical/Social History: Medications & Allergies reviewed per EMR, new medications updated. Patient Active Problem List   Diagnosis Date Noted  . Cerebral palsy ([place]) 03/01/2018  . Lumbar radiculopathy 03/01/2018  . Migraine headache    Past Medical History:  Diagnosis Date  . Cerebral palsy ([place])   . Migraine headache    Family History  Problem Relation Age of Onset  . Breast cancer Maternal Grandmother    History reviewed. No pertinent surgical history. Social History   Occupational History  . Not on file  Tobacco Use  . Smoking status: Never Smoker  . Smokeless tobacco: Never Used  Substance and Sexual Activity  . Alcohol use: Not on file  . Drug use: Not on file  . Sexual activity: Not on file

## 2018-03-01 NOTE — Telephone Encounter (Signed)
I would try a transforaminal approach either at L4 or L5 on the right if her symptoms are still mainly right-sided and unchanged in location other than just worsening

## 2018-03-01 NOTE — Procedures (Signed)
Lumbar Epidural Steroid Injection - Interlaminar Approach with Fluoroscopic Guidance  Patient: Holly Mcconnell      Date of Birth: 1945/04/27 MRN: [id number] PCP: Cari Caraway, MD      Visit Date: 02/04/2018   Universal Protocol:     Consent Given By: the patient  Position: PRONE  Additional Comments: Vital signs were monitored before and after the procedure. Patient was prepped and draped in the usual sterile fashion. The correct patient, procedure, and site was verified.   Injection Procedure Details:  Procedure Site One Meds Administered:  Meds ordered this encounter  Medications  . methylPREDNISolone acetate (DEPO-MEDROL) injection 80 mg     Laterality: Right  Location/Site:  L4-L5  Needle size: 20 G  Needle type: Tuohy  Needle Placement: Paramedian epidural  Findings:   -Comments: Excellent flow of contrast into the epidural space.  Procedure Details: Using a paramedian approach from the side mentioned above, the region overlying the inferior lamina was localized under fluoroscopic visualization and the soft tissues overlying this structure were infiltrated with 4 ml. of 1% Lidocaine without Epinephrine. The Tuohy needle was inserted into the epidural space using a paramedian approach.   The epidural space was localized using loss of resistance along with lateral and bi-planar fluoroscopic views.  After negative aspirate for air, blood, and CSF, a 2 ml. volume of Isovue-250 was injected into the epidural space and the flow of contrast was observed. Radiographs were obtained for documentation purposes.    The injectate was administered into the level noted above.   Additional Comments:  The patient tolerated the procedure well Dressing: Band-Aid    Post-procedure details: Patient was observed during the procedure. Post-procedure instructions were reviewed.  Patient left the clinic in stable condition.

## 2018-03-15 ENCOUNTER — Ambulatory Visit (INDEPENDENT_AMBULATORY_CARE_PROVIDER_SITE_OTHER): Payer: PPO | Admitting: Physical Medicine and Rehabilitation

## 2018-03-15 ENCOUNTER — Encounter (INDEPENDENT_AMBULATORY_CARE_PROVIDER_SITE_OTHER): Payer: Self-pay | Admitting: Physical Medicine and Rehabilitation

## 2018-03-15 ENCOUNTER — Ambulatory Visit (INDEPENDENT_AMBULATORY_CARE_PROVIDER_SITE_OTHER): Payer: Self-pay

## 2018-03-15 VITALS — BP 107/61 | HR 65 | Temp 97.5°F

## 2018-03-15 DIAGNOSIS — M48062 Spinal stenosis, lumbar region with neurogenic claudication: Secondary | ICD-10-CM

## 2018-03-15 DIAGNOSIS — M5416 Radiculopathy, lumbar region: Secondary | ICD-10-CM | POA: Diagnosis not present

## 2018-03-15 DIAGNOSIS — M5116 Intervertebral disc disorders with radiculopathy, lumbar region: Secondary | ICD-10-CM | POA: Diagnosis not present

## 2018-03-15 MED ORDER — BETAMETHASONE SOD PHOS & ACET 6 (3-3) MG/ML IJ SUSP
12.0000 mg | Freq: Once | INTRAMUSCULAR | Status: AC
  Administered 2018-03-15: 12 mg

## 2018-03-15 NOTE — Progress Notes (Signed)
Numeric Pain Rating Scale and Functional Assessment Average Pain 9   In the last MONTH (on 0-10 scale) has pain interfered with the following?  1. General activity like being  able to carry out your everyday physical activities such as walking, climbing stairs, carrying groceries, or moving a chair?  Rating(7)   +Driver, -BT, -Dye Allergies. 

## 2018-03-15 NOTE — Patient Instructions (Signed)

## 2018-03-15 NOTE — Progress Notes (Signed)
Holly Mcconnell - 73 y.o. female MRN [id number]  Date of birth: 23-Jun-1945  Office Visit Note: Visit Date: 03/15/2018 PCP: Cari Caraway, MD Referred by: Cari Caraway, MD  Subjective: Chief Complaint  Patient presents with  . Lower Back - Pain  . Right Thigh - Pain   HPI:  Holly Mcconnell is a 73 y.o. female who comes in today For likely reinjection for moderate multifactorial stenosis at L4-5 with right foraminal stenosis and arthropathy at L4-5.  She continues to have right hip and leg pain despite L4-5 interlaminar epidural steroid injection few weeks ago.  That injection seem to help some pain she was having in the knee but did not really help her back and hip and leg.  She reports about 48 hours of relief of some low back pain but then it started right back immediately.  She reports worsening going from sit to stand and with bending and walking.  She does report Aleve does help some.  This does radiate into the right side.  Pain is 9 out of 10.  Her history is complicated by adult cerebral palsy.  I think the best approach is to try right L4 transforaminal injection into this area where it is a tight spot do to some scoliosis and arthropathy.  Depending on relief would look at repeat transforaminal injection versus facet block.  Medication changes can be looked at as well.  ROS Otherwise per HPI.  Assessment & Plan: Visit Diagnoses:  1. 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 Radiculopathy due to lumbar intervertebral disc disorder     Plan: No additional findings.   Meds & Orders:  Meds ordered this encounter  Medications  . betamethasone acetate-betamethasone sodium phosphate (CELESTONE) injection 12 mg    Orders Placed This Encounter  Procedures  . XR C-ARM NO REPORT  . Epidural Steroid injection    Follow-up: Return if symptoms worsen or fail to improve.   Procedures: No procedures performed    Lumbosacral Transforaminal Epidural Steroid Injection - Sub-Pedicular Approach with Fluoroscopic Guidance  Patient: Holly Mcconnell      Date of Birth: 1945/09/30 MRN: [id number] PCP: Cari Caraway, MD      Visit Date: 03/15/2018   Universal Protocol:    Date/Time: 03/15/2018  Consent Given By: the patient  Position: PRONE  Additional Comments: Vital signs were monitored before and after the procedure. Patient was prepped and draped in the usual sterile fashion. The correct patient, procedure, and site was verified.   Injection Procedure Details:  Procedure Site One Meds Administered:  Meds ordered this encounter  Medications  . betamethasone acetate-betamethasone sodium phosphate (CELESTONE) injection 12 mg    Laterality: Right  Location/Site:  L4-L5  Needle size: 22 G  Needle type: Spinal  Needle Placement: Transforaminal  Findings:    -Comments: Excellent flow of contrast along the nerve and into the epidural space.  Procedure Details: After squaring off the end-plates to get a true AP view, the C-arm was positioned so that an oblique view of the foramen as noted above was visualized. The target area is just inferior to the "nose of the scotty dog" or sub pedicular. The soft tissues overlying this structure were infiltrated with 2-3 ml. of 1% Lidocaine without Epinephrine.  The spinal needle was inserted toward the target using a "trajectory" view along the fluoroscope beam.  Under AP and lateral visualization, the needle was advanced so it did not  puncture dura and was located close the 6 O'Clock position of the pedical in AP tracterory. Biplanar projections were used to confirm position. Aspiration was confirmed to be negative for CSF and/or blood. A 1-2 ml. volume of Isovue-250 was injected and flow of contrast was noted at each level. Radiographs were obtained for documentation purposes.   After attaining the desired flow of contrast documented above,  a 0.5 to 1.0 ml test dose of 0.25% Marcaine was injected into each respective transforaminal space.  The patient was observed for 90 seconds post injection.  After no sensory deficits were reported, and normal lower extremity motor function was noted,   the above injectate was administered so that equal amounts of the injectate were placed at each foramen (level) into the transforaminal epidural space.   Additional Comments:  The patient tolerated the procedure well Dressing: Band-Aid    Post-procedure details: Patient was observed during the procedure. Post-procedure instructions were reviewed.  Patient left the clinic in stable condition.     Clinical History: MRI LUMBAR SPINE WITHOUT CONTRAST  TECHNIQUE: Multiplanar, multisequence MR imaging of the lumbar spine was performed. No intravenous contrast was administered.  COMPARISON:  12/15/2011  FINDINGS: Segmentation:  Standard.  Alignment:  Mild thoracolumbar dextroscoliosis.  No listhesis.  Vertebrae: No fracture or suspicious osseous lesion. Small T11 superior endplate Schmorl's node. Decreased right-sided posterior element edema at L3. Minimal degenerative endplate edema on the right at L4-5.  Conus medullaris and cauda equina: Conus extends to the L2 level. Conus and cauda equina appear normal.  Paraspinal and other soft tissues: Unremarkable.  Disc levels:  Disc desiccation throughout the lumbar spine. Progressive moderate disc space narrowing at L4-5 and L5-S1 and mild narrowing at L3-4.  T12-L1: Negative.  L1-2: Minimal ligamentum flavum hypertrophy without disc herniation or stenosis.  L2-3: New minimal disc bulging and ligamentum flavum hypertrophy without stenosis.  L3-4: Mild disc bulging slightly greater to the left and mild facet and ligamentum flavum hypertrophy result in mild spinal stenosis and mild right lateral recess stenosis, slightly progressed from prior. No significant  neural foraminal stenosis.  L4-5: Disc bulging greater to the right and moderate facet and ligamentum flavum hypertrophy result in moderate spinal stenosis, moderate right and mild left lateral recess stenosis, and mild right neural foraminal stenosis, mildly progressed from prior.  L5-S1: Disc bulging, small superimposed central disc extrusion, and moderate facet and ligamentum flavum hypertrophy result in mild spinal stenosis, moderate bilateral lateral recess stenosis, and mild bilateral neural foraminal stenosis. Stenosis has progressed, particularly in the lateral recesses with potential impingement of both S1 nerve roots.  IMPRESSION: 1. Progressive lower lumbar disc and facet degeneration with moderate spinal and right lateral stenosis at L4-5. 2. Moderate bilateral lateral recess stenosis at L5-S1. 3. Mild spinal stenosis at L3-4.   Electronically Signed   By: Logan Bores M.D.   On: 01/19/2018 09:33     Objective:  VS:  HT:    WT:   BMI:     BP:107/61  HR:65bpm  TEMP:(!) 97.5 F (36.4 C)(Oral)  RESP:  Physical Exam  Ortho Exam Imaging: Xr C-arm No Report  Result Date: 03/15/2018 Please see Notes tab for imaging impression.

## 2018-03-15 NOTE — Procedures (Signed)
Lumbosacral Transforaminal Epidural Steroid Injection - Sub-Pedicular Approach with Fluoroscopic Guidance  Patient: Holly Mcconnell      Date of Birth: 23-Jul-1945 MRN: [id number] PCP: Cari Caraway, MD      Visit Date: 03/15/2018   Universal Protocol:    Date/Time: 03/15/2018  Consent Given By: the patient  Position: PRONE  Additional Comments: Vital signs were monitored before and after the procedure. Patient was prepped and draped in the usual sterile fashion. The correct patient, procedure, and site was verified.   Injection Procedure Details:  Procedure Site One Meds Administered:  Meds ordered this encounter  Medications  . betamethasone acetate-betamethasone sodium phosphate (CELESTONE) injection 12 mg    Laterality: Right  Location/Site:  L4-L5  Needle size: 22 G  Needle type: Spinal  Needle Placement: Transforaminal  Findings:    -Comments: Excellent flow of contrast along the nerve and into the epidural space.  Procedure Details: After squaring off the end-plates to get a true AP view, the C-arm was positioned so that an oblique view of the foramen as noted above was visualized. The target area is just inferior to the "nose of the scotty dog" or sub pedicular. The soft tissues overlying this structure were infiltrated with 2-3 ml. of 1% Lidocaine without Epinephrine.  The spinal needle was inserted toward the target using a "trajectory" view along the fluoroscope beam.  Under AP and lateral visualization, the needle was advanced so it did not puncture dura and was located close the 6 O'Clock position of the pedical in AP tracterory. Biplanar projections were used to confirm position. Aspiration was confirmed to be negative for CSF and/or blood. A 1-2 ml. volume of Isovue-250 was injected and flow of contrast was noted at each level. Radiographs were obtained for documentation purposes.   After attaining the desired flow of contrast documented above, a  0.5 to 1.0 ml test dose of 0.25% Marcaine was injected into each respective transforaminal space.  The patient was observed for 90 seconds post injection.  After no sensory deficits were reported, and normal lower extremity motor function was noted,   the above injectate was administered so that equal amounts of the injectate were placed at each foramen (level) into the transforaminal epidural space.   Additional Comments:  The patient tolerated the procedure well Dressing: Band-Aid    Post-procedure details: Patient was observed during the procedure. Post-procedure instructions were reviewed.  Patient left the clinic in stable condition.

## 2018-03-17 ENCOUNTER — Ambulatory Visit (INDEPENDENT_AMBULATORY_CARE_PROVIDER_SITE_OTHER): Payer: PPO | Admitting: Cardiology

## 2018-03-17 ENCOUNTER — Encounter: Payer: Self-pay | Admitting: Cardiology

## 2018-03-17 VITALS — BP 110/62 | HR 87 | Ht 59.5 in | Wt 119.4 lb

## 2018-03-17 DIAGNOSIS — R002 Palpitations: Secondary | ICD-10-CM | POA: Diagnosis not present

## 2018-03-17 DIAGNOSIS — I479 Paroxysmal tachycardia, unspecified: Secondary | ICD-10-CM | POA: Insufficient documentation

## 2018-03-17 NOTE — Progress Notes (Signed)
PCP: Cari Caraway, MD  Clinic Note: Chief Complaint  Patient presents with  . New Patient (Initial Visit)  . Palpitations    Since January 2019, several episodes lasting 4-8 hours.    HPI: Holly Mcconnell is a 73 y.o. female with past medical history notable for hyperlipidemia and hypothyroidism as well as mild cerebral palsy.  She is being seen today for the evaluation of prolonged episodes of palpitations at the request of Via, Lennette Bihari, MD.  [name] was seen by Dr. Lynelle Doctor on February 10, 2018 noted having symptoms of recurrent palpitations that can last up to 6-8 hours maybe once or twice a month.  No associated shortness of breath.  Concerned because her mother had atrial fib.Marland Kitchen  Recent Hospitalizations: None  Studies Personally Reviewed - (if available, images/films reviewed: From Epic Chart or Care Everywhere)  None  Interval History: Holly Mcconnell presents here today with a timetable of dates that she has had episodes lasting anywhere from 4 to 8 hours of somewhat fast irregular heart beats.  These date back to January 2019 and happened just about once or twice a month.  (January 9 and 24, February 15, March 4 and 28, April 28.  Relatively stable during the summer and then July 7 and 28, September 9, October 8, November 17 and 29 as well as February 12.  Most recent episode was January 2 of this year) She says that these episodes feel like an irregular sensation and pressure in her chest.  She can feel that the usually fast and regular more so than irregular that is unusual.  They come on all of a sudden without any clear cut reason, but happen more when she is "busy during the day.  Symptoms are not ever noticed during her exercise routine or with routine walking around.  Usually when she is busy doing something.  Only with these episodes that she noticed chest tightness and shortness of breath.  Otherwise no exertional chest tightness or pressure.  No exertional  dyspnea.  She has not necessarily felt near syncopal or and has not had a syncopal episode.  However she does feel somewhat dizzy and lightheaded about halfway into these episodes.  Usually she is able to sit down and rest and they go away on their own after several hours. She is not had any TIA or amaurosis fugax symptoms. No PND, orthopnea or edema.  No claudication.  A comprehensive was performed. Review of Systems  Constitutional: Negative for chills and fever.  HENT: Negative for congestion and nosebleeds.   Respiratory: Negative for cough, shortness of breath and wheezing.   Gastrointestinal: Negative for blood in stool, heartburn and melena.  Musculoskeletal: Positive for back pain (A little bit).       Uses a walker  Neurological: Positive for dizziness (Only with palpitation episodes).       She does not seem to be at all troubled by her limitations from cerebral palsy.  Endo/Heme/Allergies: Negative for environmental allergies.  Psychiatric/Behavioral: Negative for depression and memory loss. The patient is not nervous/anxious and does not have insomnia.   All other systems reviewed and are negative.  I have reviewed and (if needed) personally updated the patient's problem list, medications, allergies, past medical and surgical history, social and family history.   Past Medical History:  Diagnosis Date  . Cerebral palsy (HCC)    Mild symptoms.  . Migraine headache     Past Surgical History:  Procedure Laterality Date  .  CHOLECYSTECTOMY  1989  . TEMPOROMANDIBULAR JOINT ARTHROPLASTY    . Thyroid nodule resection  2005  . TOTAL ABDOMINAL HYSTERECTOMY W/ BILATERAL SALPINGOOPHORECTOMY  1982    Current Meds  Medication Sig  . calcium-vitamin D (OSCAL WITH D) 500-200 MG-UNIT tablet Take 1 tablet by mouth.  Javier Docker Oil 500 MG CAPS Take by mouth.  . levothyroxine (SYNTHROID, LEVOTHROID) 75 MCG tablet TK 1 T PO QD IN THE MORNING OES  . lovastatin (MEVACOR) 20 MG tablet TK 1  T PO QD WAC  . Multiple Vitamins-Minerals (MULTIVITAMIN ADULT PO) Take by mouth.  . SUMAtriptan (IMITREX) 100 MG tablet Take 100 mg by mouth every 2 (two) hours as needed for migraine. May repeat in 2 hours if headache persists or recurs.  . triamcinolone (KENALOG) 0.025 % cream Apply 1 application topically 2 (two) times daily.  . Turmeric 1053 MG TABS Take by mouth.    Allergies  Allergen Reactions  . Amoxicillin Diarrhea  . Codeine Diarrhea    Social History   Tobacco Use  . Smoking status: Never Smoker  . Smokeless tobacco: Never Used  Substance Use Topics  . Alcohol use: Never    Frequency: Never  . Drug use: Never   Social History   Social History Narrative   She is retired.  Formerly worked in Insurance underwriter.  She is married (husband's name is Abbe Amsterdam -married in January 03, 1967).   They have 1 child and 8 biologic grandkids and one adopted grandkid.  She lives with her husband and is very active.     She does cardio-strength exercises with a trainer roughly 5 days a week for 30+ minutes at a time.      She walks with a walker and has some mild speech deficits and musculoskeletal deficits from history of cerebral palsy syndrome. -- >  Does have a handicap placard       family history includes Atrial fibrillation in her maternal grandfather and maternal uncle; Atrial fibrillation (age of onset: 82) in her mother; Breast cancer in her maternal grandmother; CAD in her mother; COPD in her mother; Colon cancer in her maternal grandmother; Hodgkin's lymphoma in her father; Hypercholesterolemia in her mother; Hyperlipidemia in her sister; Hypertension in her mother and sister; Hypothyroidism in her mother; Lung cancer in her father; Pancreatic cancer in her sister; Stroke in her mother.  Wt Readings from Last 3 Encounters:  03/17/18 119 lb 6.4 oz (54.2 kg)  01/08/18 115 lb (52.2 kg)    PHYSICAL EXAM BP 110/62 (BP Location: Right Arm, Patient Position: Sitting, Cuff Size: Normal)    Pulse 87   Ht 4' 11.5" (1.511 m)   Wt 119 lb 6.4 oz (54.2 kg)   BMI 23.71 kg/m  Physical Exam  Constitutional: She is oriented to person, place, and time. She appears well-developed and well-nourished. No distress.  Healthy-appearing  HENT:  Head: Normocephalic and atraumatic.  Eyes: Pupils are equal, round, and reactive to light. Conjunctivae and EOM are normal.  Neck: Normal range of motion. Neck supple. No hepatojugular reflux and no JVD present. Carotid bruit is not present.  Cardiovascular: Normal rate, regular rhythm, normal heart sounds and intact distal pulses. Exam reveals no gallop and no friction rub.  No murmur heard. Pulmonary/Chest: Effort normal and breath sounds normal. No respiratory distress. She has no wheezes. She has no rales.  Abdominal: Soft. Bowel sounds are normal. She exhibits no distension. There is no abdominal tenderness. There is no rebound.  Musculoskeletal: Normal range  of motion.        General: No edema.     Comments: Mild kyphoscoliosis.  Mild palsies of the hands, but seems quite functional.  Neurological: She is alert and oriented to person, place, and time.  Skin: Skin is warm and dry.  Psychiatric: She has a normal mood and affect. Her behavior is normal. Judgment and thought content normal.  Vitals reviewed.   Adult ECG Report  Rate: 87 ;  Rhythm: normal sinus rhythm and Normal axis, intervals and durations.;   Narrative Interpretation: Normal EKG   Other studies Reviewed: Additional studies/ records that were reviewed today include:  Recent Labs:  No results found for: CREATININE, BUN, NA, K, CL, CO2 No results found for: CHOL, HDL, LDLCALC, LDLDIRECT, TRIG, CHOLHDL Labs from [facility] 02/10/2018: TC 243, TG 99, HDL 123, LDL 101.  Creatinine 0.72.  Potassium 4.4.  Hemoglobin 12.9.  TSH 2.0.   ASSESSMENT / PLAN: Problem List Items Addressed This Visit    Heart palpitations - Primary    She has episodes may happen only once a month.  But they  do seem to be prolonged. We will need to know what these episodes are.  It sounds like it may be potentially atrial fibrillation or SVT.  Would be very long for SVT and not having symptoms though.  Plan: 30-day event monitor.  If this is not successful, we need to discuss other portable home monitors.      Relevant Orders   EKG 12-Lead (Completed)   CARDIAC EVENT MONITOR       I spent a total of 25 minutes with the patient and chart review. >  50% of the time was spent in direct patient consultation.   Current medicines are reviewed at length with the patient today.  (+/- concerns) none The following changes have been made:  none  Patient Instructions  Medication Instructions:  NOT NEEDED If you need a refill on your cardiac medications before your next appointment, please call your pharmacy.   Lab work: NOT NEEDED If you have labs (blood work) drawn today and your tests are completely normal, you will receive your results only by: Marland Kitchen MyChart Message (if you have MyChart) OR . A paper copy in the mail If you have any lab test that is abnormal or we need to change your treatment, we will call you to review the results.  Testing/Procedures: SCHEDULE AT [facility] has recommended that you wear an event monitor 30 DAY. Event monitors are medical devices that record the heart's electrical activity. Doctors most often [place] these monitors to diagnose arrhythmias. Arrhythmias are problems with the speed or rhythm of the heartbeat. The monitor is a small, portable device. You can wear one while you do your normal daily activities. This is usually used to diagnose what is causing palpitations/syncope (passing out).   Follow-Up: At [facility], you and your health needs are our priority.  As part of our continuing mission to provide you with exceptional heart care, we have created designated Provider Care Teams.  These Care Teams include your primary  Cardiologist (physician) and Advanced Practice Providers (APPs -  Physician Assistants and Nurse Practitioners) who all work together to provide you with the care you need, when you need it. . Your physician recommends that you schedule a follow-up appointment in 2 [place]. .   Any Other Special Instructions Will Be Listed Below (If Applicable).  Studies Ordered:   Orders Placed This Encounter  Procedures  . CARDIAC EVENT MONITOR  . EKG 12-Lead      Glenetta Hew, M.D., M.S. Interventional Cardiologist   Pager # [phone] Phone # [phone] [address] [place], [place] [address]   Thank you for choosing Heartcare at [facility]!!

## 2018-03-17 NOTE — Patient Instructions (Signed)
Medication Instructions:  NOT NEEDED If you need a refill on your cardiac medications before your next appointment, please call your pharmacy.   Lab work: NOT NEEDED If you have labs (blood work) drawn today and your tests are completely normal, you will receive your results only by: Marland Kitchen MyChart Message (if you have MyChart) OR . A paper copy in the mail If you have any lab test that is abnormal or we need to change your treatment, we will call you to review the results.  Testing/Procedures: SCHEDULE AT [facility] has recommended that you wear an event monitor 30 DAY. Event monitors are medical devices that record the heart's electrical activity. Doctors most often [place] these monitors to diagnose arrhythmias. Arrhythmias are problems with the speed or rhythm of the heartbeat. The monitor is a small, portable device. You can wear one while you do your normal daily activities. This is usually used to diagnose what is causing palpitations/syncope (passing out).   Follow-Up: At [facility], you and your health needs are our priority.  As part of our continuing mission to provide you with exceptional heart care, we have created designated Provider Care Teams.  These Care Teams include your primary Cardiologist (physician) and Advanced Practice Providers (APPs -  Physician Assistants and Nurse Practitioners) who all work together to provide you with the care you need, when you need it. . Your physician recommends that you schedule a follow-up appointment in 2 [place]. .   Any Other Special Instructions Will Be Listed Below (If Applicable).

## 2018-03-20 ENCOUNTER — Encounter: Payer: Self-pay | Admitting: Cardiology

## 2018-03-21 ENCOUNTER — Encounter: Payer: Self-pay | Admitting: Cardiology

## 2018-03-21 NOTE — Assessment & Plan Note (Signed)
She has episodes may happen only once a month.  But they do seem to be prolonged. We will need to know what these episodes are.  It sounds like it may be potentially atrial fibrillation or SVT.  Would be very long for SVT and not having symptoms though.  Plan: 30-day event monitor.  If this is not successful, we need to discuss other portable home monitors.

## 2018-03-31 ENCOUNTER — Ambulatory Visit (INDEPENDENT_AMBULATORY_CARE_PROVIDER_SITE_OTHER): Payer: PPO

## 2018-03-31 DIAGNOSIS — R002 Palpitations: Secondary | ICD-10-CM | POA: Diagnosis not present

## 2018-04-06 DIAGNOSIS — R002 Palpitations: Secondary | ICD-10-CM | POA: Diagnosis not present

## 2018-04-30 DIAGNOSIS — E042 Nontoxic multinodular goiter: Secondary | ICD-10-CM | POA: Diagnosis not present

## 2018-04-30 DIAGNOSIS — Z9889 Other specified postprocedural states: Secondary | ICD-10-CM | POA: Diagnosis not present

## 2018-04-30 DIAGNOSIS — Z6822 Body mass index (BMI) 22.0-22.9, adult: Secondary | ICD-10-CM | POA: Diagnosis not present

## 2018-04-30 DIAGNOSIS — E039 Hypothyroidism, unspecified: Secondary | ICD-10-CM | POA: Diagnosis not present

## 2018-05-20 ENCOUNTER — Telehealth: Payer: Self-pay | Admitting: *Deleted

## 2018-05-20 NOTE — Telephone Encounter (Signed)
   Primary Cardiologist:  No primary care provider on file.   Patient contacted.  History reviewed.  No symptoms to suggest any unstable cardiac conditions.  Based on discussion, with current pandemic situation, we will be postponing this appointment for @PATIENTNAME @.  If symptoms change, she has been instructed to contact our office.   Routing to C19 CANCEL pool for tracking (P CV DIV CV19 CANCEL) and assigning priority (1 = 4-6 wks, 2 = 6-12 wks, 3 = >12 wks).  Raiford Simmonds, RN  05/20/2018 4:12 PM         .

## 2018-05-26 ENCOUNTER — Ambulatory Visit: Payer: PPO | Admitting: Cardiology

## 2018-06-24 ENCOUNTER — Telehealth: Payer: Self-pay | Admitting: Cardiology

## 2018-06-24 NOTE — Telephone Encounter (Signed)
Mychart, smartphone, pre reg complete 06/24/18 AF

## 2018-06-25 ENCOUNTER — Encounter: Payer: Self-pay | Admitting: Cardiology

## 2018-06-25 ENCOUNTER — Telehealth: Payer: Self-pay | Admitting: *Deleted

## 2018-06-25 ENCOUNTER — Telehealth (INDEPENDENT_AMBULATORY_CARE_PROVIDER_SITE_OTHER): Payer: PPO | Admitting: Cardiology

## 2018-06-25 DIAGNOSIS — I479 Paroxysmal tachycardia, unspecified: Secondary | ICD-10-CM | POA: Diagnosis not present

## 2018-06-25 MED ORDER — ACEBUTOLOL HCL 200 MG PO CAPS: 200.0000 mg | ORAL_CAPSULE | Freq: Two times a day (BID) | ORAL | 3 refills | Status: DC

## 2018-06-25 NOTE — Telephone Encounter (Signed)
CALLED PATIENT TO DISCUSS INSTRUCTIONS FROM TELE-VISIT - NOT EXCEPTING CALLS -  SUMMARY WILL BE SENT VIA [place].

## 2018-06-25 NOTE — Progress Notes (Signed)
Virtual Visit via Video Note   This visit type was conducted due to national recommendations for restrictions regarding the COVID-19 Pandemic (e.g. social distancing) in an effort to limit this patient's exposure and mitigate transmission in our community.  Due to her co-morbid illnesses, this patient is at least at moderate risk for complications without adequate follow up.  This format is felt to be most appropriate for this patient at this time.  All issues noted in this document were discussed and addressed.  A limited physical exam was performed with this format.  Please refer to the patient's chart for her consent to telehealth for [facility]   Patient has given verbal permission to conduct this visit via virtual appointment and to bill insurance 06/24/2018 9:52 AM     Evaluation Performed:  Follow-up visit  Date:  06/25/2018   ID:  Holly Mcconnell, DOB 1945/10/07, MRN [id number]  Patient Location: Home Provider Location: Home  PCP:  Cari Caraway, MD  Cardiologist:  Glenetta Hew, MD  Electrophysiologist:  None   Chief Complaint: Follow-up evaluation for palpitations  History of Present Illness:    Holly Mcconnell is a 73 y.o. female with PMH notable for hypertension and hypothyroidism who presents via audio/video conferencing for a telehealth visit today after wearing a 30-day event monitor to evaluate palpitations.  Holly Mcconnell was seen in January 2020 in consultation at the request of Dr. Via for evaluation of fast irregular heartbeats.  Episodes are happening with increased frequency, but usually is once twice a month.  Symptoms associated with some discomfort in the chest.  Interval History:  Holly Mcconnell is doing relatively well.  She has been having some musculoskeletal type chest discomfort over the last couple days more related to some of the exercises she has been doing.  It hurts with certain movements, but fine when she sits still or lies  down.  Not necessarily exacerbated by just walking.  She is maybe had 2 episodes of fast heart rate spells since I saw her and since starting the monitor.  Lasting maybe 45 minutes to an hour.  When she checks it on her monitor, the rates are in the 120s.  She did not notice the episode on the monitor where the heart rate was in the 160s and 170s.  Of course that this did happen when she was sleeping.  Otherwise relatively stable from a cardiac standpoint.  Cardiovascular ROS: positive for - chest pain, palpitations, rapid heart rate and this is worse with certain movements - more MSK pain negative for - dyspnea on exertion, edema, loss of consciousness, orthopnea, paroxysmal nocturnal dyspnea, shortness of breath or TIA/ AMAUROSIS FUGAX    ROS:  Please see the history of present illness. The patient does not have symptoms concerning for COVID-19 infection (fever, chills, cough, or new shortness of breath).   The patient is practicing social distancing. On-line food delivery.   Past Medical History:  Diagnosis Date  . Cerebral palsy (HCC)    Mild symptoms.  . Migraine headache    Past Surgical History:  Procedure Laterality Date  . CHOLECYSTECTOMY  1989  . TEMPOROMANDIBULAR JOINT ARTHROPLASTY    . Thyroid nodule resection  2005  . TOTAL ABDOMINAL HYSTERECTOMY W/ BILATERAL SALPINGOOPHORECTOMY  1982     Current Meds  Medication Sig  . calcium-vitamin D (OSCAL WITH D) 500-200 MG-UNIT tablet Take 1 tablet by mouth.  Javier Docker Oil 500 MG CAPS Take by mouth.  . levothyroxine (SYNTHROID) 50  MCG tablet TAKE 1 TABLET BY MOUTH 3 DAYS A WEEK ALTERNATING WITH 75 MCG  . levothyroxine (SYNTHROID, LEVOTHROID) 75 MCG tablet TK 1 T PO QD IN THE MORNING OES  . lovastatin (MEVACOR) 20 MG tablet TK 1 T PO QD WAC  . MAGNESIUM PO Take by mouth daily.  . Multiple Vitamins-Minerals (MULTIVITAMIN ADULT PO) Take by mouth.  . naproxen sodium (ALEVE) 220 MG tablet Take 220 mg by mouth 2 (two) times daily as  needed (for back pain).  . SUMAtriptan (IMITREX) 100 MG tablet Take 100 mg by mouth every 2 (two) hours as needed for migraine. May repeat in 2 hours if headache persists or recurs.  . Turmeric 1053 MG TABS Take by mouth.  . [DISCONTINUED] triamcinolone (KENALOG) 0.025 % cream Apply 1 application topically 2 (two) times daily.     Allergies:   Amoxicillin and Codeine   Social History   Tobacco Use  . Smoking status: Never Smoker  . Smokeless tobacco: Never Used  Substance Use Topics  . Alcohol use: Never    Frequency: Never  . Drug use: Never     Family Hx: The patient's family history includes Atrial fibrillation in her maternal grandfather and maternal uncle; Atrial fibrillation (age of onset: 41) in her mother; Breast cancer in her maternal grandmother; CAD in her mother; COPD in her mother; Colon cancer in her maternal grandmother; Hodgkin's lymphoma in her father; Hypercholesterolemia in her mother; Hyperlipidemia in her sister; Hypertension in her mother and sister; Hypothyroidism in her mother; Lung cancer in her father; Pancreatic cancer in her sister; Stroke in her mother.   Prior CV studies:   The following studies were reviewed today: . 30 D Event Monitor Feb 202:    Mostly Sinus Rhythm: rates from 57-162.  Relatively rare PACs & minimal PVCs  1 spell lasting ~10 min on 2/27 (~1 AM) that looks like Sinus Tachycardia with PACs, but the computer reads as Atrial Flutter with variable block - rate ~160 (this is not a usual rate for A Flutter) -- suspect that this is just Sinus Tachycardia   Reviewed with the EP -he also believes that this is probably not atrial flutter, could be a very short run of A. Fib; the second episode was more regular, and could potentially be flutter. Diary symptoms mostly noted with sinus rhythm and sinus tachycardia.  Labs/Other Tests and Data Reviewed:    EKG:  No ECG reviewed.  Recent Labs: No results found for requested labs within last  8760 hours.   Recent Lipid Panel No results found for: CHOL, TRIG, HDL, CHOLHDL, LDLCALC, LDLDIRECT  Wt Readings from Last 3 Encounters:  06/25/18 115 lb (52.2 kg)  03/17/18 119 lb 6.4 oz (54.2 kg)  01/08/18 115 lb (52.2 kg)     Objective:    Vital Signs:  BP 135/84   Pulse 88   Ht 4\' 11"  (1.499 m)   Wt 115 lb (52.2 kg)   BMI 23.23 kg/m   VITAL SIGNS:  reviewed Well nourished, well developed female in noacute distress. A&O x 3.  Normal Mood & Affect Non-labored respirations Appears comfortable.  Baseline speech impediment noted.  ASSESSMENT & PLAN:    Problem List Items Addressed This Visit    Paroxysmal tachycardia, unspecified (HCC) (Chronic)    Interestingly, she did not have symptoms when the monitor showed heart rates in the 160s and 170s, but when she feels them she notes on her watch the heart rates being in the 120s.  Reviewing her event monitor, cannot be certain that this is not A. fib, but not sure that it is.  Given the concern for there being a possibility of being A. fib, I think is not unreasonable to actually have her restart the aspirin.  Not yet convinced that full anticoagulation is warranted.  We will also start low-dose acebutolol 200 mg twice daily to prevent the rapid bursts.      Relevant Medications   acebutolol (SECTRAL) 200 MG capsule      COVID-19 Education: The signs and symptoms of COVID-19 were discussed with the patient and how to seek care for testing (follow up with PCP or arrange E-visit).   The importance of social distancing was discussed today.  Time:   Today, I have spent 25 minutes with the patient with telehealth technology discussing the above problems.     Medication Adjustments/Labs and Tests Ordered: Current medicines are reviewed at length with the patient today.  Concerns regarding medicines are outlined above.  Medication Instructions:   Restart taking baby aspirin 81 mg daily.  New prescription: Acebutolol 200 mg  twice daily  Tests Ordered: No orders of the defined types were placed in this encounter.   Medication Changes: Meds ordered this encounter  Medications  . acebutolol (SECTRAL) 200 MG capsule    Sig: Take 1 capsule (200 mg total) by mouth 2 (two) times daily.    Dispense:  180 capsule    Refill:  3  See above  Disposition:  Follow up In November    Signed, Scheryl Sanborn, MD  06/25/2018 5:18 PM    [facility]

## 2018-06-25 NOTE — Patient Instructions (Addendum)
Medication Instructions:   Restart taking baby aspirin 81 mg daily.  New prescription: Acebutolol 200 mg twice daily-- PRESCRIPTION WAS SENT TO PHARMACY  If you need a refill on your cardiac medications before your next appointment, please call your pharmacy.   Lab work: NOT NEEDED   Testing/Procedures: NOT NEEDED  Follow-Up: At [facility], you and your health needs are our priority.  As part of our continuing mission to provide you with exceptional heart care, we have created designated Provider Care Teams.  These Care Teams include your primary Cardiologist (physician) and Advanced Practice Providers (APPs -  Physician Assistants and Nurse Practitioners) who all work together to provide you with the care you need, when you need it. You will need a follow up appointment in 7 months NOV 2020.  Please call our office 2 months in advance to schedule this appointment.  You may see Glenetta Hew, MD  or one of the following Advanced Practice Providers on your designated Care Team:   Rosaria Ferries, PA-C . Jory Sims, DNP, ANP  Any Other Special Instructions Will Be Listed Below (If Applicable).

## 2018-06-25 NOTE — Assessment & Plan Note (Addendum)
Interestingly, she did not have symptoms when the monitor showed heart rates in the 160s and 170s, but when she feels them she notes on her watch the heart rates being in the 120s. Reviewing her event monitor, cannot be certain that this is not A. fib, but not sure that it is.  Given the concern for there being a possibility of being A. fib, I think is not unreasonable to actually have her restart the aspirin.  Not yet convinced that full anticoagulation is warranted.  We will also start low-dose acebutolol 200 mg twice daily to prevent the rapid bursts.

## 2018-06-30 DIAGNOSIS — E039 Hypothyroidism, unspecified: Secondary | ICD-10-CM | POA: Diagnosis not present

## 2018-07-06 DIAGNOSIS — E039 Hypothyroidism, unspecified: Secondary | ICD-10-CM | POA: Diagnosis not present

## 2018-07-06 DIAGNOSIS — Z7189 Other specified counseling: Secondary | ICD-10-CM | POA: Diagnosis not present

## 2018-07-06 DIAGNOSIS — Z6822 Body mass index (BMI) 22.0-22.9, adult: Secondary | ICD-10-CM | POA: Diagnosis not present

## 2018-07-06 DIAGNOSIS — Z9889 Other specified postprocedural states: Secondary | ICD-10-CM | POA: Diagnosis not present

## 2018-07-06 DIAGNOSIS — E042 Nontoxic multinodular goiter: Secondary | ICD-10-CM | POA: Diagnosis not present

## 2018-07-13 NOTE — Telephone Encounter (Signed)
Pt email ? :  I think you are probably OK for short term run of Aleve -- not for long term.   Will review with our clinical pharmacists to see if they know something I am not aware of.  Will let you know what I hear back.  Glenetta Hew, MD   ===View-only below this line===   ----- Message -----    From: [name]    Sent: 07/13/2018 12:52 PM EDT      To: Glenetta Hew, MD Subject: Visit Follow-Up Question  When we had my last video visit I forgot to ask a question. I've been taking 2 Aleve twice a day up to 10 days for "occasional" back pain flare ups, prescribed by Dr Laurence Spates. The info insert with my Acebutolol warns not to take nsaids with this new drug. Is this your advice, too and if so is there another OTC that I could safely try? Thanks!   Glenetta Hew, MD

## 2018-08-16 ENCOUNTER — Telehealth: Payer: Self-pay | Admitting: *Deleted

## 2018-08-16 NOTE — Telephone Encounter (Signed)
Ok

## 2018-08-17 NOTE — Telephone Encounter (Signed)
Pt is scheduled 09/02/2018 with driver.

## 2018-08-29 ENCOUNTER — Encounter: Payer: Self-pay | Admitting: Physical Medicine and Rehabilitation

## 2018-09-01 DIAGNOSIS — E039 Hypothyroidism, unspecified: Secondary | ICD-10-CM | POA: Diagnosis not present

## 2018-09-02 ENCOUNTER — Encounter: Payer: Self-pay | Admitting: Physical Medicine and Rehabilitation

## 2018-09-02 ENCOUNTER — Other Ambulatory Visit: Payer: Self-pay

## 2018-09-02 ENCOUNTER — Ambulatory Visit (INDEPENDENT_AMBULATORY_CARE_PROVIDER_SITE_OTHER): Payer: PPO | Admitting: Physical Medicine and Rehabilitation

## 2018-09-02 ENCOUNTER — Ambulatory Visit: Payer: Self-pay

## 2018-09-02 VITALS — BP 124/66 | HR 60

## 2018-09-02 DIAGNOSIS — M5416 Radiculopathy, lumbar region: Secondary | ICD-10-CM | POA: Diagnosis not present

## 2018-09-02 DIAGNOSIS — M48062 Spinal stenosis, lumbar region with neurogenic claudication: Secondary | ICD-10-CM | POA: Diagnosis not present

## 2018-09-02 DIAGNOSIS — M5116 Intervertebral disc disorders with radiculopathy, lumbar region: Secondary | ICD-10-CM

## 2018-09-02 MED ORDER — BETAMETHASONE SOD PHOS & ACET 6 (3-3) MG/ML IJ SUSP
12.0000 mg | Freq: Once | INTRAMUSCULAR | Status: AC
  Administered 2018-09-02: 12 mg

## 2018-09-02 NOTE — Progress Notes (Signed)
 .  Numeric Pain Rating Scale and Functional Assessment Average Pain 8   In the last MONTH (on 0-10 scale) has pain interfered with the following?  1. General activity like being  able to carry out your everyday physical activities such as walking, climbing stairs, carrying groceries, or moving a chair?  Rating(6)   +Driver, -BT, -Dye Allergies.  

## 2018-09-07 DIAGNOSIS — Z9889 Other specified postprocedural states: Secondary | ICD-10-CM | POA: Diagnosis not present

## 2018-09-07 DIAGNOSIS — E039 Hypothyroidism, unspecified: Secondary | ICD-10-CM | POA: Diagnosis not present

## 2018-09-07 DIAGNOSIS — Z6822 Body mass index (BMI) 22.0-22.9, adult: Secondary | ICD-10-CM | POA: Diagnosis not present

## 2018-09-07 DIAGNOSIS — Z7189 Other specified counseling: Secondary | ICD-10-CM | POA: Diagnosis not present

## 2018-09-07 DIAGNOSIS — E042 Nontoxic multinodular goiter: Secondary | ICD-10-CM | POA: Diagnosis not present

## 2018-09-08 ENCOUNTER — Encounter: Payer: Self-pay | Admitting: Physical Medicine and Rehabilitation

## 2018-09-15 ENCOUNTER — Encounter: Payer: Self-pay | Admitting: Physical Medicine and Rehabilitation

## 2018-09-22 ENCOUNTER — Encounter: Payer: Self-pay | Admitting: Physical Medicine and Rehabilitation

## 2018-09-30 ENCOUNTER — Other Ambulatory Visit: Payer: Self-pay

## 2018-09-30 ENCOUNTER — Ambulatory Visit: Payer: Self-pay

## 2018-09-30 ENCOUNTER — Ambulatory Visit (INDEPENDENT_AMBULATORY_CARE_PROVIDER_SITE_OTHER): Payer: PPO | Admitting: Physical Medicine and Rehabilitation

## 2018-09-30 ENCOUNTER — Encounter: Payer: Self-pay | Admitting: Physical Medicine and Rehabilitation

## 2018-09-30 VITALS — BP 115/65 | HR 65

## 2018-09-30 DIAGNOSIS — M5416 Radiculopathy, lumbar region: Secondary | ICD-10-CM

## 2018-09-30 MED ORDER — BETAMETHASONE SOD PHOS & ACET 6 (3-3) MG/ML IJ SUSP
12.0000 mg | Freq: Once | INTRAMUSCULAR | Status: AC
  Administered 2018-09-30: 12 mg

## 2018-09-30 NOTE — Progress Notes (Signed)
Numeric Pain Rating Scale and Functional Assessment Average Pain 9   In the last MONTH (on 0-10 scale) has pain interfered with the following?  1. General activity like being  able to carry out your everyday physical activities such as walking, climbing stairs, carrying groceries, or moving a chair?  Rating(7)   +Driver, -BT, -Dye Allergies. 

## 2018-10-01 NOTE — Progress Notes (Signed)
   To whom it may concern:  Mrs. Holly Mcconnell has been a patient of mine for many years.  Despite her neuromuscular condition as well as ongoing progressive spine problems, that I treat, she has done extremely well from a pain control, spasticity management and physical functioning  standpoint with fairly aggressive exercise including almost daily strengthening and flexibility with in particular skilled personal training.  She has had physical therapy and some chiropractic care but really does well with a combination of daily exercise as it helps with her spasticity as a result of her neuromuscular condition.  We see her for intermittent spine procedures and she does well with those but it really seems to be a combination of what we do along with the exercise.  My medical opinion is that if there is some way that she could be allowed to have a personal trainer and access to her gym facility, obviously with appropriate sanitation and social distancing, it would be of great benefit to her medically and her quality of life.  She reports to me today that the current gym facility she uses is the best that she has had thus far and that she feels it has been a significant detriment to her condition not being able to exercise fully at this point.   Sincerely,  Laurence Spates, MD [place]

## 2018-10-04 ENCOUNTER — Encounter: Payer: Self-pay | Admitting: Physical Medicine and Rehabilitation

## 2018-10-05 DIAGNOSIS — E039 Hypothyroidism, unspecified: Secondary | ICD-10-CM | POA: Diagnosis not present

## 2018-10-05 DIAGNOSIS — G809 Cerebral palsy, unspecified: Secondary | ICD-10-CM | POA: Diagnosis not present

## 2018-10-05 DIAGNOSIS — E7800 Pure hypercholesterolemia, unspecified: Secondary | ICD-10-CM | POA: Diagnosis not present

## 2018-10-05 DIAGNOSIS — M47816 Spondylosis without myelopathy or radiculopathy, lumbar region: Secondary | ICD-10-CM | POA: Diagnosis not present

## 2018-10-05 DIAGNOSIS — M859 Disorder of bone density and structure, unspecified: Secondary | ICD-10-CM | POA: Diagnosis not present

## 2018-10-11 ENCOUNTER — Encounter: Payer: Self-pay | Admitting: Physical Medicine and Rehabilitation

## 2018-10-14 MED ORDER — NADOLOL 20 MG PO TABS: 20.0000 mg | ORAL_TABLET | Freq: Every day | ORAL | 3 refills | Status: DC

## 2018-10-14 NOTE — Telephone Encounter (Signed)
My Chart Note f/u:  Patient: Dr Ellyn Hack, as you suggested I weaned myself off Acebutolol & took the last pill on 7/30 morning. My nausea has mostly gone, the diarrhea is less often, my vision is some better & my leg pain is no better. My HR has been bouncing around from the 50's to 110 daily with no apparent cause. I've only noticed the fluttering twice. You said you'd reduce the dosage or change to another medication when I let you know if the symptoms improved. What do I need to do? Thanks!  My response. Lets give about 1 full week off of Acebutalol.  We can then start low dose Nadolol 20 mg.   Will call in Rx for Nadolol 20 mg PO Daily (at bedtime).  Disp # 90 tab.  3 refills. Start 10/21/2018  Glenetta Hew, MD

## 2018-10-14 NOTE — Procedures (Signed)
S1 Lumbosacral Transforaminal Epidural Steroid Injection - Sub-Pedicular Approach with Fluoroscopic Guidance   Patient: Holly Mcconnell      Date of Birth: 02-Jan-1946 MRN: [id number] PCP: Cari Caraway, MD      Visit Date: 09/02/2018   Universal Protocol:    Date/Time: 08/13/201:10 PM  Consent Given By: the patient  Position:  PRONE  Additional Comments: Vital signs were monitored before and after the procedure. Patient was prepped and draped in the usual sterile fashion. The correct patient, procedure, and site was verified.   Injection Procedure Details:  Procedure Site One Meds Administered:  Meds ordered this encounter  Medications  . betamethasone acetate-betamethasone sodium phosphate (CELESTONE) injection 12 mg    Laterality: Left  Location/Site:  S1 Foramen   Needle size: 22 ga.  Needle type: Spinal  Needle Placement: Transforaminal  Findings:   -Comments: Excellent flow of contrast along the nerve and into the epidural space.  Procedure Details: After squaring off the sacral end-plate to get a true AP view, the C-arm was positioned so that the best possible view of the S1 foramen was visualized. The soft tissues overlying this structure were infiltrated with 2-3 ml. of 1% Lidocaine without Epinephrine.    The spinal needle was inserted toward the target using a "trajectory" view along the fluoroscope beam.  Under AP and lateral visualization, the needle was advanced so it did not puncture dura. Biplanar projections were used to confirm position. Aspiration was confirmed to be negative for CSF and/or blood. A 1-2 ml. volume of Isovue-250 was injected and flow of contrast was noted at each level. Radiographs were obtained for documentation purposes.   After attaining the desired flow of contrast documented above, a 0.5 to 1.0 ml test dose of 0.25% Marcaine was injected into each respective transforaminal space.  The patient was observed for 90 seconds  post injection.  After no sensory deficits were reported, and normal lower extremity motor function was noted,   the above injectate was administered so that equal amounts of the injectate were placed at each foramen (level) into the transforaminal epidural space.   Additional Comments:  The patient tolerated the procedure well Dressing: Band-Aid with 2 x 2 sterile gauze    Post-procedure details: Patient was observed during the procedure. Post-procedure instructions were reviewed.  Patient left the clinic in stable condition.

## 2018-10-14 NOTE — Progress Notes (Signed)
Holly Mcconnell - 73 y.o. female MRN [id number]  Date of birth: 02-11-1946  Office Visit Note: Visit Date: 09/02/2018 PCP: Cari Caraway, MD Referred by: Cari Caraway, MD  Subjective: Chief Complaint  Patient presents with  . Lower Back - Pain  . Left Thigh - Pain   HPI:  Holly Mcconnell is a 73 y.o. female who comes in today For reevaluation management of now left lower back pain and left thigh pain.  Typically she has had more left-sided symptoms last time we saw her we completed a right L4 transforaminal injection in January with good relief for at least a couple of months of relief but the symptoms returned.  She thinks a lot of her problems are the fact that she cannot work out now in the gym do to the coronavirus epidemic.  She is someone who has worked out every day almost 6 days a week with a trainer at times and this does seem to help her.  Her case is very complicated by lumbar scoliosis and lumbar foraminal and lateral recess stenosis without high-grade central stenosis to go along with adult cerebral palsy and spasticity.  She reports a lot of pain getting up and down and does use Aleve for some relief.  Symptoms seem to be posterior leg on the left.  We have been to complete a diagnostic left S1 transforaminal epidural steroid injection.  Depending on relief of these not lasting very long we may update MRI of the lumbar spine.  She has no focal weakness or new issues neurologically.  ROS Otherwise per HPI.  Assessment & Plan: Visit Diagnoses:  1. 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 Radiculopathy due to lumbar intervertebral disc disorder     Plan: No additional findings.   Meds & Orders:  Meds ordered this encounter  Medications  . betamethasone acetate-betamethasone sodium phosphate (CELESTONE) injection 12 mg    Orders Placed This Encounter  Procedures  . XR C-ARM NO REPORT  . Epidural  Steroid injection    Follow-up: No follow-ups on file.   Procedures: No procedures performed  S1 Lumbosacral Transforaminal Epidural Steroid Injection - Sub-Pedicular Approach with Fluoroscopic Guidance   Patient: Holly Mcconnell      Date of Birth: [birth year] MRN: [id number] PCP: Cari Caraway, MD      Visit Date: 09/02/2018   Universal Protocol:    Date/Time: 08/13/201:10 PM  Consent Given By: the patient  Position:  PRONE  Additional Comments: Vital signs were monitored before and after the procedure. Patient was prepped and draped in the usual sterile fashion. The correct patient, procedure, and site was verified.   Injection Procedure Details:  Procedure Site One Meds Administered:  Meds ordered this encounter  Medications  . betamethasone acetate-betamethasone sodium phosphate (CELESTONE) injection 12 mg    Laterality: Left  Location/Site:  S1 Foramen   Needle size: 22 ga.  Needle type: Spinal  Needle Placement: Transforaminal  Findings:   -Comments: Excellent flow of contrast along the nerve and into the epidural space.  Procedure Details: After squaring off the sacral end-plate to get a true AP view, the C-arm was positioned so that the best possible view of the S1 foramen was visualized. The soft tissues overlying this structure were infiltrated with 2-3 ml. of 1% Lidocaine without Epinephrine.    The spinal needle was inserted toward the target using a "trajectory" view along the fluoroscope beam.  Under AP  and lateral visualization, the needle was advanced so it did not puncture dura. Biplanar projections were used to confirm position. Aspiration was confirmed to be negative for CSF and/or blood. A 1-2 ml. volume of Isovue-250 was injected and flow of contrast was noted at each level. Radiographs were obtained for documentation purposes.   After attaining the desired flow of contrast documented above, a 0.5 to 1.0 ml test dose of 0.25%  Marcaine was injected into each respective transforaminal space.  The patient was observed for 90 seconds post injection.  After no sensory deficits were reported, and normal lower extremity motor function was noted,   the above injectate was administered so that equal amounts of the injectate were placed at each foramen (level) into the transforaminal epidural space.   Additional Comments:  The patient tolerated the procedure well Dressing: Band-Aid with 2 x 2 sterile gauze    Post-procedure details: Patient was observed during the procedure. Post-procedure instructions were reviewed.  Patient left the clinic in stable condition.    Clinical History: MRI LUMBAR SPINE WITHOUT CONTRAST  TECHNIQUE: Multiplanar, multisequence MR imaging of the lumbar spine was performed. No intravenous contrast was administered.  COMPARISON:  12/15/2011  FINDINGS: Segmentation:  Standard.  Alignment:  Mild thoracolumbar dextroscoliosis.  No listhesis.  Vertebrae: No fracture or suspicious osseous lesion. Small T11 superior endplate Schmorl's node. Decreased right-sided posterior element edema at L3. Minimal degenerative endplate edema on the right at L4-5.  Conus medullaris and cauda equina: Conus extends to the L2 level. Conus and cauda equina appear normal.  Paraspinal and other soft tissues: Unremarkable.  Disc levels:  Disc desiccation throughout the lumbar spine. Progressive moderate disc space narrowing at L4-5 and L5-S1 and mild narrowing at L3-4.  T12-L1: Negative.  L1-2: Minimal ligamentum flavum hypertrophy without disc herniation or stenosis.  L2-3: New minimal disc bulging and ligamentum flavum hypertrophy without stenosis.  L3-4: Mild disc bulging slightly greater to the left and mild facet and ligamentum flavum hypertrophy result in mild spinal stenosis and mild right lateral recess stenosis, slightly progressed from prior. No significant neural foraminal  stenosis.  L4-5: Disc bulging greater to the right and moderate facet and ligamentum flavum hypertrophy result in moderate spinal stenosis, moderate right and mild left lateral recess stenosis, and mild right neural foraminal stenosis, mildly progressed from prior.  L5-S1: Disc bulging, small superimposed central disc extrusion, and moderate facet and ligamentum flavum hypertrophy result in mild spinal stenosis, moderate bilateral lateral recess stenosis, and mild bilateral neural foraminal stenosis. Stenosis has progressed, particularly in the lateral recesses with potential impingement of both S1 nerve roots.  IMPRESSION: 1. Progressive lower lumbar disc and facet degeneration with moderate spinal and right lateral stenosis at L4-5. 2. Moderate bilateral lateral recess stenosis at L5-S1. 3. Mild spinal stenosis at L3-4.   Electronically Signed   By: Logan Bores M.D.   On: 01/19/2018 09:33     Objective:  VS:  HT:    WT:   BMI:     BP:124/66  HR:60bpm  TEMP: ( )  RESP:  Physical Exam  Ortho Exam Imaging: No results found.

## 2018-10-14 NOTE — Progress Notes (Signed)
Holly Mcconnell - 73 y.o. female MRN [id number]  Date of birth: January 17, 1946  Office Visit Note: Visit Date: 09/30/2018 PCP: Cari Caraway, MD Referred by: Cari Caraway, MD  Subjective: Chief Complaint  Patient presents with  . Lower Back - Pain  . Right Leg - Pain  . Left Leg - Pain   HPI:  Holly Mcconnell is a 73 y.o. female who comes in today For reevaluation management of low back pain and now bilateral hip and thigh pain.  Her case is complicated by adult cerebral palsy.  She has moderate multifactorial stenosis at L4-5 with lateral recess stenosis at L5-S1.  Mild stenosis at L3-4.  She does have some level of scoliosis with convex curvature.  Her case is further complicated by the fact that she is not been able to exercise and strength training at the gym.  She has been going to the gym now for many years and check she has a new gym that she is attending and she has had personal training.  This seems to help her a great deal is lately just not been able to get her much relief with injection.  S1 transforaminal injection did not help at all.  Prior L4 injection had helped but it was short-lived.  MRI is in fact fairly new this was completed last year.  I still think the best approach is epidural injection we will do bilateral L4 transforaminal injections and see how this does.  We have written a letter to her gym to see if there is any way they could do some personal training with appropriate distance measuring and masking do to the coronavirus.  She has had no focal deficits that are new no other red flag complaints.  ROS Otherwise per HPI.  Assessment & Plan: Visit Diagnoses:  1. Lumbar radiculopathy     Plan: No additional findings.   Meds & Orders:  Meds ordered this encounter  Medications  . betamethasone acetate-betamethasone sodium phosphate (CELESTONE) injection 12 mg    Orders Placed This Encounter  Procedures  . XR C-ARM NO REPORT  . Epidural Steroid  injection    Follow-up: No follow-ups on file.   Procedures: No procedures performed  Lumbosacral Transforaminal Epidural Steroid Injection - Sub-Pedicular Approach with Fluoroscopic Guidance  Patient: Holly Mcconnell      Date of Birth: 07/07/1945 MRN: [id number] PCP: Cari Caraway, MD      Visit Date: 09/30/2018   Universal Protocol:    Date/Time: 09/30/2018  Consent Given By: the patient  Position: PRONE  Additional Comments: Vital signs were monitored before and after the procedure. Patient was prepped and draped in the usual sterile fashion. The correct patient, procedure, and site was verified.   Injection Procedure Details:  Procedure Site One Meds Administered:  Meds ordered this encounter  Medications  . betamethasone acetate-betamethasone sodium phosphate (CELESTONE) injection 12 mg    Laterality: Bilateral  Location/Site:  L5-S1  Needle size: 22 G  Needle type: Spinal  Needle Placement: Transforaminal  Findings:    -Comments: Excellent flow of contrast along the nerve and into the epidural space.  Procedure Details: After squaring off the end-plates to get a true AP view, the C-arm was positioned so that an oblique view of the foramen as noted above was visualized. The target area is just inferior to the "nose of the scotty dog" or sub pedicular. The soft tissues overlying this structure were infiltrated with 2-3 ml. of 1% Lidocaine without Epinephrine.  The spinal needle was inserted toward the target using a "trajectory" view along the fluoroscope beam.  Under AP and lateral visualization, the needle was advanced so it did not puncture dura and was located close the 6 O'Clock position of the pedical in AP tracterory. Biplanar projections were used to confirm position. Aspiration was confirmed to be negative for CSF and/or blood. A 1-2 ml. volume of Isovue-250 was injected and flow of contrast was noted at each level. Radiographs were obtained  for documentation purposes.   After attaining the desired flow of contrast documented above, a 0.5 to 1.0 ml test dose of 0.25% Marcaine was injected into each respective transforaminal space.  The patient was observed for 90 seconds post injection.  After no sensory deficits were reported, and normal lower extremity motor function was noted,   the above injectate was administered so that equal amounts of the injectate were placed at each foramen (level) into the transforaminal epidural space.   Additional Comments:  The patient tolerated the procedure well Dressing: 2 x 2 sterile gauze and Band-Aid    Post-procedure details: Patient was observed during the procedure. Post-procedure instructions were reviewed.  Patient left the clinic in stable condition.    Clinical History: MRI LUMBAR SPINE WITHOUT CONTRAST  TECHNIQUE: Multiplanar, multisequence MR imaging of the lumbar spine was performed. No intravenous contrast was administered.  COMPARISON:  12/15/2011  FINDINGS: Segmentation:  Standard.  Alignment:  Mild thoracolumbar dextroscoliosis.  No listhesis.  Vertebrae: No fracture or suspicious osseous lesion. Small T11 superior endplate Schmorl's node. Decreased right-sided posterior element edema at L3. Minimal degenerative endplate edema on the right at L4-5.  Conus medullaris and cauda equina: Conus extends to the L2 level. Conus and cauda equina appear normal.  Paraspinal and other soft tissues: Unremarkable.  Disc levels:  Disc desiccation throughout the lumbar spine. Progressive moderate disc space narrowing at L4-5 and L5-S1 and mild narrowing at L3-4.  T12-L1: Negative.  L1-2: Minimal ligamentum flavum hypertrophy without disc herniation or stenosis.  L2-3: New minimal disc bulging and ligamentum flavum hypertrophy without stenosis.  L3-4: Mild disc bulging slightly greater to the left and mild facet and ligamentum flavum hypertrophy result  in mild spinal stenosis and mild right lateral recess stenosis, slightly progressed from prior. No significant neural foraminal stenosis.  L4-5: Disc bulging greater to the right and moderate facet and ligamentum flavum hypertrophy result in moderate spinal stenosis, moderate right and mild left lateral recess stenosis, and mild right neural foraminal stenosis, mildly progressed from prior.  L5-S1: Disc bulging, small superimposed central disc extrusion, and moderate facet and ligamentum flavum hypertrophy result in mild spinal stenosis, moderate bilateral lateral recess stenosis, and mild bilateral neural foraminal stenosis. Stenosis has progressed, particularly in the lateral recesses with potential impingement of both S1 nerve roots.  IMPRESSION: 1. Progressive lower lumbar disc and facet degeneration with moderate spinal and right lateral stenosis at L4-5. 2. Moderate bilateral lateral recess stenosis at L5-S1. 3. Mild spinal stenosis at L3-4.   Electronically Signed   By: Logan Bores M.D.   On: 01/19/2018 09:33     Objective:  VS:  HT:    WT:   BMI:     BP:115/65  HR:65bpm  TEMP: ( )  RESP:  Physical Exam  Ortho Exam Imaging: No results found.

## 2018-10-14 NOTE — Procedures (Signed)
Lumbosacral Transforaminal Epidural Steroid Injection - Sub-Pedicular Approach with Fluoroscopic Guidance  Patient: Holly Mcconnell      Date of Birth: November 24, 1945 MRN: [id number] PCP: Cari Caraway, MD      Visit Date: 09/30/2018   Universal Protocol:    Date/Time: 09/30/2018  Consent Given By: the patient  Position: PRONE  Additional Comments: Vital signs were monitored before and after the procedure. Patient was prepped and draped in the usual sterile fashion. The correct patient, procedure, and site was verified.   Injection Procedure Details:  Procedure Site One Meds Administered:  Meds ordered this encounter  Medications  . betamethasone acetate-betamethasone sodium phosphate (CELESTONE) injection 12 mg    Laterality: Bilateral  Location/Site:  L5-S1  Needle size: 22 G  Needle type: Spinal  Needle Placement: Transforaminal  Findings:    -Comments: Excellent flow of contrast along the nerve and into the epidural space.  Procedure Details: After squaring off the end-plates to get a true AP view, the C-arm was positioned so that an oblique view of the foramen as noted above was visualized. The target area is just inferior to the "nose of the scotty dog" or sub pedicular. The soft tissues overlying this structure were infiltrated with 2-3 ml. of 1% Lidocaine without Epinephrine.  The spinal needle was inserted toward the target using a "trajectory" view along the fluoroscope beam.  Under AP and lateral visualization, the needle was advanced so it did not puncture dura and was located close the 6 O'Clock position of the pedical in AP tracterory. Biplanar projections were used to confirm position. Aspiration was confirmed to be negative for CSF and/or blood. A 1-2 ml. volume of Isovue-250 was injected and flow of contrast was noted at each level. Radiographs were obtained for documentation purposes.   After attaining the desired flow of contrast documented  above, a 0.5 to 1.0 ml test dose of 0.25% Marcaine was injected into each respective transforaminal space.  The patient was observed for 90 seconds post injection.  After no sensory deficits were reported, and normal lower extremity motor function was noted,   the above injectate was administered so that equal amounts of the injectate were placed at each foramen (level) into the transforaminal epidural space.   Additional Comments:  The patient tolerated the procedure well Dressing: 2 x 2 sterile gauze and Band-Aid    Post-procedure details: Patient was observed during the procedure. Post-procedure instructions were reviewed.  Patient left the clinic in stable condition.

## 2018-10-20 DIAGNOSIS — D2272 Melanocytic nevi of left lower limb, including hip: Secondary | ICD-10-CM | POA: Diagnosis not present

## 2018-10-20 DIAGNOSIS — D2261 Melanocytic nevi of right upper limb, including shoulder: Secondary | ICD-10-CM | POA: Diagnosis not present

## 2018-10-20 DIAGNOSIS — L814 Other melanin hyperpigmentation: Secondary | ICD-10-CM | POA: Diagnosis not present

## 2018-10-20 DIAGNOSIS — L821 Other seborrheic keratosis: Secondary | ICD-10-CM | POA: Diagnosis not present

## 2018-10-20 DIAGNOSIS — L718 Other rosacea: Secondary | ICD-10-CM | POA: Diagnosis not present

## 2018-10-20 DIAGNOSIS — D2271 Melanocytic nevi of right lower limb, including hip: Secondary | ICD-10-CM | POA: Diagnosis not present

## 2018-10-20 DIAGNOSIS — D2262 Melanocytic nevi of left upper limb, including shoulder: Secondary | ICD-10-CM | POA: Diagnosis not present

## 2018-10-21 ENCOUNTER — Ambulatory Visit (INDEPENDENT_AMBULATORY_CARE_PROVIDER_SITE_OTHER): Payer: PPO | Admitting: Physical Medicine and Rehabilitation

## 2018-10-21 ENCOUNTER — Encounter: Payer: Self-pay | Admitting: Physical Medicine and Rehabilitation

## 2018-10-21 ENCOUNTER — Ambulatory Visit: Payer: Self-pay

## 2018-10-21 ENCOUNTER — Other Ambulatory Visit: Payer: Self-pay

## 2018-10-21 VITALS — BP 123/70 | HR 86

## 2018-10-21 DIAGNOSIS — M4125 Other idiopathic scoliosis, thoracolumbar region: Secondary | ICD-10-CM

## 2018-10-21 DIAGNOSIS — M48062 Spinal stenosis, lumbar region with neurogenic claudication: Secondary | ICD-10-CM | POA: Diagnosis not present

## 2018-10-21 DIAGNOSIS — M5416 Radiculopathy, lumbar region: Secondary | ICD-10-CM | POA: Diagnosis not present

## 2018-10-21 MED ORDER — BETAMETHASONE SOD PHOS & ACET 6 (3-3) MG/ML IJ SUSP
12.0000 mg | Freq: Once | INTRAMUSCULAR | Status: AC
  Administered 2018-10-21: 12 mg

## 2018-10-21 NOTE — Progress Notes (Signed)
Holly Mcconnell - 73 y.o. female MRN [id number]  Date of birth: 1945-05-22  Office Visit Note: Visit Date: 10/21/2018 PCP: Cari Caraway, MD Referred by: Cari Caraway, MD  Subjective: Chief Complaint  Patient presents with  . Lower Back - Pain  . Right Leg - Pain  . Left Leg - Pain   HPI:  Holly Mcconnell is a 73 y.o. female who comes in today For planned bilateral L4 transforaminal epidural steroid injection.  Patient's history well known and documented in the chart.  She has been having low back and bilateral radicular type leg pain now for several months and has really not gotten much relief with S1 or L5 transforaminal injections.  She has pretty significant scoliosis with degenerative changes more right than left with moderate multifactorial stenosis at L4-5.  MRI from 2019.  No focal weakness.  She has been able to start working out at the gym.  We did write a letter for her to have a personal trainer and be able to do this even with the coronavirus restrictions and she has been happy to get back in the gym and get stronger.  If she does not get relief with this injection I think we are at a point where injections just are not going to be very beneficial and we would look at medication type management with things like gabapentin or Lyrica etc.  Would also look at TENS unit and regrouping with physical therapy.  ROS Otherwise per HPI.  Assessment & Plan: Visit Diagnoses:  1. 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 Other idiopathic scoliosis, thoracolumbar region     Plan: No additional findings.   Meds & Orders:  Meds ordered this encounter  Medications  . betamethasone acetate-betamethasone sodium phosphate (CELESTONE) injection 12 mg    Orders Placed This Encounter  Procedures  . XR C-ARM NO REPORT  . Epidural Steroid injection    Follow-up: Return if symptoms worsen or fail to improve.    Procedures: No procedures performed  Lumbosacral Transforaminal Epidural Steroid Injection - Sub-Pedicular Approach with Fluoroscopic Guidance  Patient: Holly Mcconnell      Date of Birth: [birth year] MRN: [id number] PCP: Cari Caraway, MD      Visit Date: 10/21/2018   Universal Protocol:    Date/Time: 10/21/2018  Consent Given By: the patient  Position: PRONE  Additional Comments: Vital signs were monitored before and after the procedure. Patient was prepped and draped in the usual sterile fashion. The correct patient, procedure, and site was verified.   Injection Procedure Details:  Procedure Site One Meds Administered:  Meds ordered this encounter  Medications  . betamethasone acetate-betamethasone sodium phosphate (CELESTONE) injection 12 mg    Laterality: Bilateral  Location/Site:  L4-L5  Needle size: 22 G  Needle type: Spinal  Needle Placement: Transforaminal  Findings:    -Comments: Excellent flow of contrast along the nerve and into the epidural space.  Procedure Details: After squaring off the end-plates to get a true AP view, the C-arm was positioned so that an oblique view of the foramen as noted above was visualized. The target area is just inferior to the "nose of the scotty dog" or sub pedicular. The soft tissues overlying this structure were infiltrated with 2-3 ml. of 1% Lidocaine without Epinephrine.  The spinal needle was inserted toward the target using a "trajectory" view along the fluoroscope beam.  Under AP and lateral visualization, the needle was  advanced so it did not puncture dura and was located close the 6 O'Clock position of the pedical in AP tracterory. Biplanar projections were used to confirm position. Aspiration was confirmed to be negative for CSF and/or blood. A 1-2 ml. volume of Isovue-250 was injected and flow of contrast was noted at each level. Radiographs were obtained for documentation purposes.   After attaining the  desired flow of contrast documented above, a 0.5 to 1.0 ml test dose of 0.25% Marcaine was injected into each respective transforaminal space.  The patient was observed for 90 seconds post injection.  After no sensory deficits were reported, and normal lower extremity motor function was noted,   the above injectate was administered so that equal amounts of the injectate were placed at each foramen (level) into the transforaminal epidural space.   Additional Comments:  The patient tolerated the procedure well Dressing: 2 x 2 sterile gauze and Band-Aid    Post-procedure details: Patient was observed during the procedure. Post-procedure instructions were reviewed.  Patient left the clinic in stable condition.    Clinical History: MRI LUMBAR SPINE WITHOUT CONTRAST  TECHNIQUE: Multiplanar, multisequence MR imaging of the lumbar spine was performed. No intravenous contrast was administered.  COMPARISON:  12/15/2011  FINDINGS: Segmentation:  Standard.  Alignment:  Mild thoracolumbar dextroscoliosis.  No listhesis.  Vertebrae: No fracture or suspicious osseous lesion. Small T11 superior endplate Schmorl's node. Decreased right-sided posterior element edema at L3. Minimal degenerative endplate edema on the right at L4-5.  Conus medullaris and cauda equina: Conus extends to the L2 level. Conus and cauda equina appear normal.  Paraspinal and other soft tissues: Unremarkable.  Disc levels:  Disc desiccation throughout the lumbar spine. Progressive moderate disc space narrowing at L4-5 and L5-S1 and mild narrowing at L3-4.  T12-L1: Negative.  L1-2: Minimal ligamentum flavum hypertrophy without disc herniation or stenosis.  L2-3: New minimal disc bulging and ligamentum flavum hypertrophy without stenosis.  L3-4: Mild disc bulging slightly greater to the left and mild facet and ligamentum flavum hypertrophy result in mild spinal stenosis and mild right lateral  recess stenosis, slightly progressed from prior. No significant neural foraminal stenosis.  L4-5: Disc bulging greater to the right and moderate facet and ligamentum flavum hypertrophy result in moderate spinal stenosis, moderate right and mild left lateral recess stenosis, and mild right neural foraminal stenosis, mildly progressed from prior.  L5-S1: Disc bulging, small superimposed central disc extrusion, and moderate facet and ligamentum flavum hypertrophy result in mild spinal stenosis, moderate bilateral lateral recess stenosis, and mild bilateral neural foraminal stenosis. Stenosis has progressed, particularly in the lateral recesses with potential impingement of both S1 nerve roots.  IMPRESSION: 1. Progressive lower lumbar disc and facet degeneration with moderate spinal and right lateral stenosis at L4-5. 2. Moderate bilateral lateral recess stenosis at L5-S1. 3. Mild spinal stenosis at L3-4.   Electronically Signed   By: Logan Bores M.D.   On: 01/19/2018 09:33     Objective:  VS:  HT:    WT:   BMI:     BP:123/70  HR:86bpm  TEMP: ( )  RESP:  Physical Exam  Ortho Exam Imaging: Xr C-arm No Report  Result Date: 10/21/2018 Please see Notes tab for imaging impression.

## 2018-10-21 NOTE — Procedures (Signed)
Lumbosacral Transforaminal Epidural Steroid Injection - Sub-Pedicular Approach with Fluoroscopic Guidance  Patient: Holly Mcconnell      Date of Birth: 1945-06-06 MRN: [id number] PCP: Cari Caraway, MD      Visit Date: 10/21/2018   Universal Protocol:    Date/Time: 10/21/2018  Consent Given By: the patient  Position: PRONE  Additional Comments: Vital signs were monitored before and after the procedure. Patient was prepped and draped in the usual sterile fashion. The correct patient, procedure, and site was verified.   Injection Procedure Details:  Procedure Site One Meds Administered:  Meds ordered this encounter  Medications  . betamethasone acetate-betamethasone sodium phosphate (CELESTONE) injection 12 mg    Laterality: Bilateral  Location/Site:  L4-L5  Needle size: 22 G  Needle type: Spinal  Needle Placement: Transforaminal  Findings:    -Comments: Excellent flow of contrast along the nerve and into the epidural space.  Procedure Details: After squaring off the end-plates to get a true AP view, the C-arm was positioned so that an oblique view of the foramen as noted above was visualized. The target area is just inferior to the "nose of the scotty dog" or sub pedicular. The soft tissues overlying this structure were infiltrated with 2-3 ml. of 1% Lidocaine without Epinephrine.  The spinal needle was inserted toward the target using a "trajectory" view along the fluoroscope beam.  Under AP and lateral visualization, the needle was advanced so it did not puncture dura and was located close the 6 O'Clock position of the pedical in AP tracterory. Biplanar projections were used to confirm position. Aspiration was confirmed to be negative for CSF and/or blood. A 1-2 ml. volume of Isovue-250 was injected and flow of contrast was noted at each level. Radiographs were obtained for documentation purposes.   After attaining the desired flow of contrast documented  above, a 0.5 to 1.0 ml test dose of 0.25% Marcaine was injected into each respective transforaminal space.  The patient was observed for 90 seconds post injection.  After no sensory deficits were reported, and normal lower extremity motor function was noted,   the above injectate was administered so that equal amounts of the injectate were placed at each foramen (level) into the transforaminal epidural space.   Additional Comments:  The patient tolerated the procedure well Dressing: 2 x 2 sterile gauze and Band-Aid    Post-procedure details: Patient was observed during the procedure. Post-procedure instructions were reviewed.  Patient left the clinic in stable condition.

## 2018-10-21 NOTE — Progress Notes (Signed)
P .Numeric Pain Rating Scale and Functional Assessment Average Pain 9   In the last MONTH (on 0-10 scale) has pain interfered with the following?  1. General activity like being  able to carry out your everyday physical activities such as walking, climbing stairs, carrying groceries, or moving a chair?  Rating(9)   +Driver, -BT, -Dye Allergies.

## 2018-10-31 ENCOUNTER — Encounter: Payer: Self-pay | Admitting: Physical Medicine and Rehabilitation

## 2018-11-03 ENCOUNTER — Other Ambulatory Visit: Payer: Self-pay | Admitting: Physical Medicine and Rehabilitation

## 2018-11-03 MED ORDER — DULOXETINE HCL 20 MG PO CPEP: ORAL_CAPSULE | ORAL | 1 refills | Status: DC

## 2018-11-04 ENCOUNTER — Other Ambulatory Visit: Payer: Self-pay | Admitting: Physical Medicine and Rehabilitation

## 2018-11-04 DIAGNOSIS — M48062 Spinal stenosis, lumbar region with neurogenic claudication: Secondary | ICD-10-CM

## 2018-11-04 DIAGNOSIS — M4125 Other idiopathic scoliosis, thoracolumbar region: Secondary | ICD-10-CM

## 2018-11-04 DIAGNOSIS — M5416 Radiculopathy, lumbar region: Secondary | ICD-10-CM

## 2018-11-11 ENCOUNTER — Other Ambulatory Visit: Payer: Self-pay | Admitting: Family Medicine

## 2018-11-11 DIAGNOSIS — M8580 Other specified disorders of bone density and structure, unspecified site: Secondary | ICD-10-CM

## 2018-11-12 DIAGNOSIS — M545 Low back pain: Secondary | ICD-10-CM | POA: Diagnosis not present

## 2018-11-12 DIAGNOSIS — Z6825 Body mass index (BMI) 25.0-25.9, adult: Secondary | ICD-10-CM | POA: Diagnosis not present

## 2018-11-16 ENCOUNTER — Encounter: Payer: Self-pay | Admitting: Physical Medicine and Rehabilitation

## 2018-11-17 ENCOUNTER — Encounter: Payer: Self-pay | Admitting: Physical Medicine and Rehabilitation

## 2018-11-18 ENCOUNTER — Other Ambulatory Visit: Payer: Self-pay | Admitting: Family Medicine

## 2018-11-18 DIAGNOSIS — Z1231 Encounter for screening mammogram for malignant neoplasm of breast: Secondary | ICD-10-CM

## 2018-11-23 DIAGNOSIS — M48061 Spinal stenosis, lumbar region without neurogenic claudication: Secondary | ICD-10-CM | POA: Diagnosis not present

## 2018-11-23 DIAGNOSIS — M47816 Spondylosis without myelopathy or radiculopathy, lumbar region: Secondary | ICD-10-CM | POA: Diagnosis not present

## 2018-11-23 DIAGNOSIS — M48062 Spinal stenosis, lumbar region with neurogenic claudication: Secondary | ICD-10-CM | POA: Diagnosis not present

## 2018-11-23 DIAGNOSIS — M5127 Other intervertebral disc displacement, lumbosacral region: Secondary | ICD-10-CM | POA: Diagnosis not present

## 2018-12-13 DIAGNOSIS — E039 Hypothyroidism, unspecified: Secondary | ICD-10-CM | POA: Diagnosis not present

## 2018-12-14 DIAGNOSIS — M545 Low back pain: Secondary | ICD-10-CM | POA: Diagnosis not present

## 2018-12-14 DIAGNOSIS — Z6825 Body mass index (BMI) 25.0-25.9, adult: Secondary | ICD-10-CM | POA: Diagnosis not present

## 2018-12-14 DIAGNOSIS — M4180 Other forms of scoliosis, site unspecified: Secondary | ICD-10-CM | POA: Diagnosis not present

## 2018-12-15 ENCOUNTER — Encounter: Payer: Self-pay | Admitting: Physical Medicine and Rehabilitation

## 2018-12-16 ENCOUNTER — Other Ambulatory Visit: Payer: Self-pay | Admitting: Physical Medicine and Rehabilitation

## 2018-12-16 DIAGNOSIS — Z8639 Personal history of other endocrine, nutritional and metabolic disease: Secondary | ICD-10-CM | POA: Diagnosis not present

## 2018-12-16 DIAGNOSIS — E039 Hypothyroidism, unspecified: Secondary | ICD-10-CM | POA: Diagnosis not present

## 2018-12-16 DIAGNOSIS — Z9889 Other specified postprocedural states: Secondary | ICD-10-CM | POA: Diagnosis not present

## 2018-12-16 DIAGNOSIS — M5416 Radiculopathy, lumbar region: Secondary | ICD-10-CM

## 2018-12-16 DIAGNOSIS — Z7189 Other specified counseling: Secondary | ICD-10-CM | POA: Diagnosis not present

## 2018-12-16 DIAGNOSIS — M4125 Other idiopathic scoliosis, thoracolumbar region: Secondary | ICD-10-CM

## 2018-12-16 DIAGNOSIS — G809 Cerebral palsy, unspecified: Secondary | ICD-10-CM

## 2018-12-24 ENCOUNTER — Encounter: Payer: Self-pay | Admitting: Physical Medicine and Rehabilitation

## 2018-12-24 DIAGNOSIS — M47897 Other spondylosis, lumbosacral region: Secondary | ICD-10-CM | POA: Diagnosis not present

## 2018-12-24 DIAGNOSIS — R197 Diarrhea, unspecified: Secondary | ICD-10-CM | POA: Diagnosis not present

## 2018-12-27 ENCOUNTER — Encounter: Payer: Self-pay | Admitting: Physical Medicine and Rehabilitation

## 2018-12-27 DIAGNOSIS — M47816 Spondylosis without myelopathy or radiculopathy, lumbar region: Secondary | ICD-10-CM | POA: Diagnosis not present

## 2018-12-27 DIAGNOSIS — M48062 Spinal stenosis, lumbar region with neurogenic claudication: Secondary | ICD-10-CM | POA: Diagnosis not present

## 2018-12-31 DIAGNOSIS — E7800 Pure hypercholesterolemia, unspecified: Secondary | ICD-10-CM | POA: Diagnosis not present

## 2018-12-31 DIAGNOSIS — E039 Hypothyroidism, unspecified: Secondary | ICD-10-CM | POA: Diagnosis not present

## 2018-12-31 DIAGNOSIS — M47897 Other spondylosis, lumbosacral region: Secondary | ICD-10-CM | POA: Diagnosis not present

## 2018-12-31 DIAGNOSIS — M47816 Spondylosis without myelopathy or radiculopathy, lumbar region: Secondary | ICD-10-CM | POA: Diagnosis not present

## 2019-01-03 ENCOUNTER — Other Ambulatory Visit: Payer: Self-pay | Admitting: Cardiology

## 2019-01-03 MED ORDER — NADOLOL 20 MG PO TABS: 20.0000 mg | ORAL_TABLET | Freq: Every day | ORAL | 0 refills | Status: DC

## 2019-01-03 NOTE — Telephone Encounter (Signed)
° ° ° °*  STAT* If patient is at the pharmacy, call can be transferred to refill team.   1. Which medications need to be refilled? (please list name of each medication and dose if known) NADOLOL  2. Which pharmacy/location (including street and city if local pharmacy) is medication to be sent to? [facility]  3. Do they need a 30 day or 90 day supply? [place]

## 2019-01-05 ENCOUNTER — Other Ambulatory Visit: Payer: Self-pay

## 2019-01-05 ENCOUNTER — Encounter: Payer: Self-pay | Admitting: Physical Medicine and Rehabilitation

## 2019-01-05 ENCOUNTER — Ambulatory Visit (INDEPENDENT_AMBULATORY_CARE_PROVIDER_SITE_OTHER): Payer: PPO | Admitting: Physical Medicine and Rehabilitation

## 2019-01-05 VITALS — BP 111/60 | HR 60 | Ht 59.0 in | Wt 120.0 lb

## 2019-01-05 DIAGNOSIS — M48062 Spinal stenosis, lumbar region with neurogenic claudication: Secondary | ICD-10-CM

## 2019-01-05 DIAGNOSIS — G809 Cerebral palsy, unspecified: Secondary | ICD-10-CM

## 2019-01-05 DIAGNOSIS — M5416 Radiculopathy, lumbar region: Secondary | ICD-10-CM | POA: Diagnosis not present

## 2019-01-05 MED ORDER — PREGABALIN 75 MG PO CAPS: 75.0000 mg | ORAL_CAPSULE | Freq: Two times a day (BID) | ORAL | 1 refills | Status: DC

## 2019-01-05 NOTE — Progress Notes (Signed)
.  Numeric Pain Rating Scale and Functional Assessment Average Pain 10 Pain Right Now 10 My pain is constant, sharp and dull Pain is worse with: bending and standing Pain improves with: nothing   In the last MONTH (on 0-10 scale) has pain interfered with the following?  1. General activity like being  able to carry out your everyday physical activities such as walking, climbing stairs, carrying groceries, or moving a chair?  Rating(9)  2. Relation with others like being able to carry out your usual social activities and roles such as  activities at home, at work and in your community. Rating(9)  3. Enjoyment of life such that you have  been bothered by emotional problems such as feeling anxious, depressed or irritable?  Rating(5)

## 2019-01-10 ENCOUNTER — Ambulatory Visit: Payer: PPO | Admitting: Cardiology

## 2019-01-10 ENCOUNTER — Other Ambulatory Visit: Payer: Self-pay

## 2019-01-10 ENCOUNTER — Encounter: Payer: Self-pay | Admitting: Cardiology

## 2019-01-10 VITALS — BP 112/67 | HR 50 | Ht 59.0 in | Wt 131.2 lb

## 2019-01-10 DIAGNOSIS — M9905 Segmental and somatic dysfunction of pelvic region: Secondary | ICD-10-CM | POA: Diagnosis not present

## 2019-01-10 DIAGNOSIS — I479 Paroxysmal tachycardia, unspecified: Secondary | ICD-10-CM | POA: Diagnosis not present

## 2019-01-10 DIAGNOSIS — M5116 Intervertebral disc disorders with radiculopathy, lumbar region: Secondary | ICD-10-CM

## 2019-01-10 DIAGNOSIS — M25552 Pain in left hip: Secondary | ICD-10-CM | POA: Diagnosis not present

## 2019-01-10 DIAGNOSIS — M5137 Other intervertebral disc degeneration, lumbosacral region: Secondary | ICD-10-CM | POA: Diagnosis not present

## 2019-01-10 DIAGNOSIS — M9903 Segmental and somatic dysfunction of lumbar region: Secondary | ICD-10-CM | POA: Diagnosis not present

## 2019-01-10 NOTE — Progress Notes (Signed)
Primary Care Provider: Cari Caraway, MD Cardiologist: Glenetta Hew, MD Electrophysiologist:   Clinic Note: Chief Complaint  Patient presents with  . Follow-up    In person visit follow-up  . Palpitations    PAT well-controlled  . Hypertension    HPI:    Holly Mcconnell is a 73 y.o. female with a PMH notalble for HTN, Hypothyroidism who presents today for f/u evaluation of Tachypalpitations / short runs of PAT   [place] was last seen via Telemedicine in April to f/u 30d Event Monitor - not sure if Afib v/s PAT - suggested ASA. Added Acebutalol 200 mg BID.--> converted to Nadolol 20 mg  Recent Hospitalizations: none  Reviewed  CV studies:    The following studies were reviewed today: (if available, images/films reviewed: From Epic Chart or Care Everywhere) . none:   Interval History:   Holly Mcconnell returns today overall feeling well.  She says that the palpitations have really improved.  Maybe about 10 or 11 episodes lasting less than a minute since last visit.  I think now that she knows what they are, she is less concerned about them.  But they definitely have slowed in rate and reduced in frequency dramatically since getting on stable dose of nadolol.  She denies any signs or symptoms of chronotropic incompetence because she is still doing her routine exercise as best as possible.  Unfortunately she is not able to go to the gym though.  She says that she may need to go through back surgery and had some questions about if there are any concerns regarding standpoint.  She has had intermittent spells of off-and-on chest discomfort mostly in the upper chest that none occur occur at all with her exercise.  They are just there occasionally at rest described as a sharp pinching pain not associated with palpitations.   CV Review of Symptoms (Summary)  positive for - chest pain and Sharp pain as noted above, palpitations/rapid heartbeats (not  irregular) as described above. negative for - edema, orthopnea, paroxysmal nocturnal dyspnea, shortness of breath or Syncope/near syncope, TIA/amaurosis fugax, claudication  The patient does not have symptoms concerning for COVID-19 infection (fever, chills, cough, or new shortness of breath).  The patient is practicing social distancing. ++ Masking.  Rarely goes out groceries/shopping.    REVIEWED OF SYSTEMS   A comprehensive ROS was performed. Review of Systems  Constitutional: Negative for malaise/fatigue and weight loss.  HENT: Negative for congestion.   Respiratory: Negative for shortness of breath.   Gastrointestinal: Negative for blood in stool, heartburn and melena.  Musculoskeletal: Positive for back pain. Negative for falls.       As long as she is careful with her walker  Neurological: Negative for dizziness, focal weakness and headaches.  Psychiatric/Behavioral: Negative for memory loss. The patient is not nervous/anxious and does not have insomnia.    I have reviewed and (if needed) personally updated the patient's problem list, medications, allergies, past medical and surgical history, social and family history.   PAST MEDICAL HISTORY   Past Medical History:  Diagnosis Date  . Cerebral palsy (HCC)    Mild symptoms.  . Migraine headache      PAST SURGICAL HISTORY   Past Surgical History:  Procedure Laterality Date  . CHOLECYSTECTOMY  1989  . TEMPOROMANDIBULAR JOINT ARTHROPLASTY    . Thyroid nodule resection  2005  . TOTAL ABDOMINAL HYSTERECTOMY W/ BILATERAL SALPINGOOPHORECTOMY  1982    30 D Event Monitor Feb  202:    Mostly Sinus Rhythm: rates from 57-162.  Relatively rare PACs & minimal PVCs  1 spell lasting ~10 min on 2/27 (~1 AM) that looks like Sinus Tachycardia with PACs, but the computer reads as Atrial Flutter with variable block - rate ~160 (this is not a usual rate for A Flutter) -- suspect that this is just Sinus Tachycardia  ? Reviewed with the  EP -he also believes that this is probably not atrial flutter, could be a very short run of A. Fib; the second episode was more regular, and could potentially be flutter.  MEDICATIONS/ALLERGIES   Current Meds  Medication Sig  . aspirin 81 MG chewable tablet Chew by mouth daily.  . calcium-vitamin D (OSCAL WITH D) 500-200 MG-UNIT tablet Take 1 tablet by mouth.  Javier Docker Oil 500 MG CAPS Take by mouth.  . levothyroxine (SYNTHROID) 50 MCG tablet TAKE 1 TABLET BY MOUTH 3 DAYS A WEEK ALTERNATING WITH 75 MCG  . levothyroxine (SYNTHROID) 50 MCG tablet Take 50 mcg by mouth once a week.  . levothyroxine (SYNTHROID, LEVOTHROID) 75 MCG tablet TK 1 T PO QD IN THE MORNING OES  . lovastatin (MEVACOR) 20 MG tablet TK 1 T PO QD WAC  . MAGNESIUM PO Take by mouth daily.  . Multiple Vitamins-Minerals (MULTIVITAMIN ADULT PO) Take by mouth.  . nadolol (CORGARD) 20 MG tablet Take 1 tablet (20 mg total) by mouth at bedtime. Must keep appt for future refills  . pregabalin (LYRICA) 75 MG capsule Take 1 capsule (75 mg total) by mouth 2 (two) times daily. Start with 1 cap at night for 7 nights then 2 x per day.  . SUMAtriptan (IMITREX) 100 MG tablet Take 100 mg by mouth every 2 (two) hours as needed for migraine. May repeat in 2 hours if headache persists or recurs.    Allergies  Allergen Reactions  . Amoxicillin Diarrhea  . Nabumetone Diarrhea and Nausea And Vomiting  . Sulfa Antibiotics Diarrhea and Nausea And Vomiting  . Codeine Diarrhea     SOCIAL HISTORY/FAMILY HISTORY   Social History   Tobacco Use  . Smoking status: Never Smoker  . Smokeless tobacco: Never Used  Substance Use Topics  . Alcohol use: Never    Frequency: Never  . Drug use: Never   Social History   Social History Narrative   She is retired.  Formerly worked in Insurance underwriter.  She is married (husband's name is Abbe Amsterdam -married in January 03, 1967).   They have 1 child and 8 biologic grandkids and one adopted grandkid.  She lives with her  husband and is very active.     She does cardio-strength exercises with a trainer roughly 5 days a week for 30+ minutes at a time.      She walks with a walker and has some mild speech deficits and musculoskeletal deficits from history of cerebral palsy syndrome. -- >  Does have a handicap placard       Family History family history includes Atrial fibrillation in her maternal grandfather and maternal uncle; Atrial fibrillation (age of onset: 92) in her mother; Breast cancer in her maternal grandmother; CAD in her mother; COPD in her mother; Colon cancer in her maternal grandmother; Hodgkin's lymphoma in her father; Hypercholesterolemia in her mother; Hyperlipidemia in her sister; Hypertension in her mother and sister; Hypothyroidism in her mother; Lung cancer in her father; Pancreatic cancer in her sister; Stroke in her mother.   OBJCTIVE -PE, EKG, labs   Wt  Readings from Last 3 Encounters:  01/10/19 131 lb 3.2 oz (59.5 kg)  01/05/19 120 lb (54.4 kg)  06/25/18 115 lb (52.2 kg)    Physical Exam: BP 112/67   Pulse (!) 50   Ht 4\' 11"  (1.499 m)   Wt 131 lb 3.2 oz (59.5 kg)   SpO2 97%   BMI 26.50 kg/m  Physical Exam  Constitutional: She is oriented to person, place, and time. She appears well-developed and well-nourished. No distress.  Healthy-appearing.  Well-groomed  HENT:  Head: Normocephalic and atraumatic.  Neck: Normal range of motion. Neck supple. No hepatojugular reflux and no JVD present. Carotid bruit is not present.  Cardiovascular: Normal rate, regular rhythm, normal heart sounds and intact distal pulses.  Occasional extrasystoles are present. PMI is not displaced. Exam reveals no gallop and no friction rub.  No murmur heard. Pulmonary/Chest: Effort normal and breath sounds normal. No respiratory distress. She has no wheezes. She has no rales.  Abdominal: Soft. Bowel sounds are normal. She exhibits no distension. There is no abdominal tenderness. There is no rebound.   Musculoskeletal:        General: Deformity (Significant kyphoscoliosis) present. No edema.  Neurological: She is alert and oriented to person, place, and time.  Somewhat slowed speech but very intelligible.  Psychiatric: She has a normal mood and affect. Her behavior is normal. Judgment and thought content normal.  Vitals reviewed.   Adult ECG Report  Rate: 52 ;  Rhythm: sinus bradycardia and Normal axis, intervals and durations.;   Narrative Interpretation: Stable EKG  Recent Labs: No new labs since December 2019.  Reviewed previously  No results found for: CREATININE, BUN, NA, K, CL, CO2  ASSESSMENT/PLAN    Problem List Items Addressed This Visit    Paroxysmal tachycardia, unspecified (HCC) - Primary (Chronic)    Much less frequent episodes that are still somewhat short-lived of tachycardia.  She is on stable dose of nadolol.  1 caveat for any potential surgeries would be that I would not stop nadolol.  No prolonged arrhythmia symptoms.  Nothing to suspect that this is truly A. fib or flutter.  Episodes are too short for atrial flutter or fib  Discussed how to avoid triggers such as caffeine, sugar, chocolate or dehydration.  Also discussed vagal maneuvers.      Relevant Orders   EKG 12-Lead (Completed)   Radiculopathy due to lumbar intervertebral disc disorder    May need upcoming back surgery.  Would be okay to hold aspirin for surgery.  At the same token would not stop beta-blocker.  She would be a low risk patient for low risk surgery.          COVID-19 Education: The signs and symptoms of COVID-19 were discussed with the patient and how to seek care for testing (follow up with PCP or arrange E-visit).   The importance of social distancing was discussed today.  I spent a total of 96minutes with the patient and chart review. >  50% of the time was spent in direct patient consultation.  Additional time spent with chart review (studies, outside notes, etc): 5 Total  Time: 15 min   Current medicines are reviewed at length with the patient today.  (+/- concerns) none   Patient Instructions / Medication Changes & Studies & Tests Ordered   Patient Instructions  Medication Instructions:  NO CHANGES  *If you need a refill on your cardiac medications before your next appointment, please call your pharmacy*  Lab Work: NOT  NEEDED  Testing/Procedures: NOT NEEDED  Follow-Up: At [facility], you and your health needs are our priority.  As part of our continuing mission to provide you with exceptional heart care, we have created designated Provider Care Teams.  These Care Teams include your primary Cardiologist (physician) and Advanced Practice Providers (APPs -  Physician Assistants and Nurse Practitioners) who all work together to provide you with the care you need, when you need it.  Your next appointment:   12 months  The format for your next appointment:   In Person  Provider:   Glenetta Hew, MD   Other Instructions   IT WILL BE OKAY TO HAVE SURGERY - YOU CAN HOLD  ASPIRIN    Studies Ordered:   Orders Placed This Encounter  Procedures  . EKG 12-Lead     Glenetta Hew, M.D., M.S. Interventional Cardiologist   Pager # [phone] Phone # [phone] [address] [place], [place] [address]   Thank you for choosing Heart[facility] at [facility]!!

## 2019-01-10 NOTE — Patient Instructions (Signed)
Medication Instructions:  NO CHANGES  *If you need a refill on your cardiac medications before your next appointment, please call your pharmacy*  Lab Work: NOT NEEDED  Testing/Procedures: NOT NEEDED  Follow-Up: At [facility], you and your health needs are our priority.  As part of our continuing mission to provide you with exceptional heart care, we have created designated Provider Care Teams.  These Care Teams include your primary Cardiologist (physician) and Advanced Practice Providers (APPs -  Physician Assistants and Nurse Practitioners) who all work together to provide you with the care you need, when you need it.  Your next appointment:   12 months  The format for your next appointment:   In Person  Provider:   Glenetta Hew, MD   Other Instructions   IT WILL BE OKAY TO [place]

## 2019-01-11 ENCOUNTER — Encounter: Payer: Self-pay | Admitting: Cardiology

## 2019-01-11 NOTE — Assessment & Plan Note (Signed)
May need upcoming back surgery.  Would be okay to hold aspirin for surgery.  At the same token would not stop beta-blocker.  She would be a low risk patient for low risk surgery.

## 2019-01-11 NOTE — Assessment & Plan Note (Addendum)
Much less frequent episodes that are still somewhat short-lived of tachycardia.  She is on stable dose of nadolol.  1 caveat for any potential surgeries would be that I would not stop nadolol.  No prolonged arrhythmia symptoms.  Nothing to suspect that this is truly A. fib or flutter.  Episodes are too short for atrial flutter or fib  Discussed how to avoid triggers such as caffeine, sugar, chocolate or dehydration.  Also discussed vagal maneuvers.

## 2019-01-13 ENCOUNTER — Other Ambulatory Visit: Payer: Self-pay

## 2019-01-13 ENCOUNTER — Ambulatory Visit (INDEPENDENT_AMBULATORY_CARE_PROVIDER_SITE_OTHER): Payer: PPO | Admitting: Physical Medicine and Rehabilitation

## 2019-01-13 ENCOUNTER — Encounter: Payer: Self-pay | Admitting: Physical Medicine and Rehabilitation

## 2019-01-13 ENCOUNTER — Ambulatory Visit: Payer: PPO

## 2019-01-13 VITALS — BP 102/63 | HR 54

## 2019-01-13 DIAGNOSIS — M5416 Radiculopathy, lumbar region: Secondary | ICD-10-CM | POA: Diagnosis not present

## 2019-01-13 MED ORDER — METHYLPREDNISOLONE ACETATE 80 MG/ML IJ SUSP
80.0000 mg | Freq: Once | INTRAMUSCULAR | Status: AC
  Administered 2019-01-13: 80 mg

## 2019-01-13 NOTE — Progress Notes (Signed)
Numeric Pain Rating Scale and Functional Assessment Average Pain 10   In the last MONTH (on 0-10 scale) has pain interfered with the following?  1. General activity like being  able to carry out your everyday physical activities such as walking, climbing stairs, carrying groceries, or moving a chair?  Rating(10)   +Driver, -BT, -Dye Allergies. 

## 2019-01-17 DIAGNOSIS — M9905 Segmental and somatic dysfunction of pelvic region: Secondary | ICD-10-CM | POA: Diagnosis not present

## 2019-01-17 DIAGNOSIS — M25552 Pain in left hip: Secondary | ICD-10-CM | POA: Diagnosis not present

## 2019-01-17 DIAGNOSIS — M9903 Segmental and somatic dysfunction of lumbar region: Secondary | ICD-10-CM | POA: Diagnosis not present

## 2019-01-17 DIAGNOSIS — M5137 Other intervertebral disc degeneration, lumbosacral region: Secondary | ICD-10-CM | POA: Diagnosis not present

## 2019-01-18 DIAGNOSIS — M5137 Other intervertebral disc degeneration, lumbosacral region: Secondary | ICD-10-CM | POA: Diagnosis not present

## 2019-01-18 DIAGNOSIS — M9903 Segmental and somatic dysfunction of lumbar region: Secondary | ICD-10-CM | POA: Diagnosis not present

## 2019-01-18 DIAGNOSIS — M25552 Pain in left hip: Secondary | ICD-10-CM | POA: Diagnosis not present

## 2019-01-18 DIAGNOSIS — M9905 Segmental and somatic dysfunction of pelvic region: Secondary | ICD-10-CM | POA: Diagnosis not present

## 2019-01-20 ENCOUNTER — Encounter: Payer: Self-pay | Admitting: Physical Medicine and Rehabilitation

## 2019-01-20 DIAGNOSIS — M4125 Other idiopathic scoliosis, thoracolumbar region: Secondary | ICD-10-CM

## 2019-01-20 DIAGNOSIS — M5416 Radiculopathy, lumbar region: Secondary | ICD-10-CM

## 2019-01-20 DIAGNOSIS — M5137 Other intervertebral disc degeneration, lumbosacral region: Secondary | ICD-10-CM | POA: Diagnosis not present

## 2019-01-20 DIAGNOSIS — M25552 Pain in left hip: Secondary | ICD-10-CM | POA: Diagnosis not present

## 2019-01-20 DIAGNOSIS — M48062 Spinal stenosis, lumbar region with neurogenic claudication: Secondary | ICD-10-CM

## 2019-01-20 DIAGNOSIS — M9903 Segmental and somatic dysfunction of lumbar region: Secondary | ICD-10-CM | POA: Diagnosis not present

## 2019-01-20 DIAGNOSIS — M9905 Segmental and somatic dysfunction of pelvic region: Secondary | ICD-10-CM | POA: Diagnosis not present

## 2019-01-24 DIAGNOSIS — M5137 Other intervertebral disc degeneration, lumbosacral region: Secondary | ICD-10-CM | POA: Diagnosis not present

## 2019-01-24 DIAGNOSIS — M25552 Pain in left hip: Secondary | ICD-10-CM | POA: Diagnosis not present

## 2019-01-24 DIAGNOSIS — M9903 Segmental and somatic dysfunction of lumbar region: Secondary | ICD-10-CM | POA: Diagnosis not present

## 2019-01-24 DIAGNOSIS — M9905 Segmental and somatic dysfunction of pelvic region: Secondary | ICD-10-CM | POA: Diagnosis not present

## 2019-01-25 DIAGNOSIS — M5137 Other intervertebral disc degeneration, lumbosacral region: Secondary | ICD-10-CM | POA: Diagnosis not present

## 2019-01-25 DIAGNOSIS — M9903 Segmental and somatic dysfunction of lumbar region: Secondary | ICD-10-CM | POA: Diagnosis not present

## 2019-01-25 DIAGNOSIS — M25552 Pain in left hip: Secondary | ICD-10-CM | POA: Diagnosis not present

## 2019-01-25 DIAGNOSIS — M9905 Segmental and somatic dysfunction of pelvic region: Secondary | ICD-10-CM | POA: Diagnosis not present

## 2019-01-31 ENCOUNTER — Encounter: Payer: Self-pay | Admitting: Physical Medicine and Rehabilitation

## 2019-01-31 DIAGNOSIS — M9903 Segmental and somatic dysfunction of lumbar region: Secondary | ICD-10-CM | POA: Diagnosis not present

## 2019-01-31 DIAGNOSIS — M25552 Pain in left hip: Secondary | ICD-10-CM | POA: Diagnosis not present

## 2019-01-31 DIAGNOSIS — M5137 Other intervertebral disc degeneration, lumbosacral region: Secondary | ICD-10-CM | POA: Diagnosis not present

## 2019-01-31 DIAGNOSIS — M9905 Segmental and somatic dysfunction of pelvic region: Secondary | ICD-10-CM | POA: Diagnosis not present

## 2019-01-31 NOTE — Progress Notes (Signed)
[name] - 73 y.o. female MRN [id number]  Date of birth: [birth year]  Office Visit Note: Visit Date: 01/05/2019 PCP: Cari Caraway, MD Referred by: Cari Caraway, MD  Subjective: Chief Complaint  Patient presents with   Lower Back - Pain   Right Leg - Pain   Left Leg - Pain   HPI: Holly Mcconnell is a 73 y.o. female who comes in today For reevaluation and management of worsening low back pain with bilateral radicular type leg pain.  Last time we completed injection was in August and the injection really did not help at all.  We have completed injections from a transforaminal approach Hetal for her as well as an L5 and S1.  She is just not gotten much relief at all this time.  This is been very frustrating with her especially with the coronavirus restrictions as she used to go to the gym every day just about.  She has been able to go recently and this has started up but has not helped her back pain.  She continues to describe 10 out of 10 back pain which is constant sharp sometimes a dull aching pain.  Is worse with bending and standing she does really can get relief at this point.  Medications have not been helpful.  She has not done well in the past with anti-inflammatories or codeine derivative medications.  She has seen Dr. Newman Pies at [facility] since I have last seen her.  I have not seen his report on his consultation but according to the patient she was told it was really nothing he could do it would not be a fairly large surgery.  He did obtain a new MRI and I reviewed that with her today using spine models and imaging.  She has moderate multifactorial stenosis at L4-5 with lateral recess narrowing as well.  No high-grade focal nerve compression.  She does have some mild scoliosis centered at the L2-3 junction.  Her case is complicated by dull cerebral palsy.  She continues with home exercise and working at the gym with a  trainer.  She has not noted any focal weakness or any other changes.  MRI did not show any other issues.  We have made a referral to a neurologist Dr. Posey Pronto but based on the diagnosis codes they refused to see her for what was released on the diagnostic code even though I was looking at potentially just having them do an electrodiagnostic study.  Review of Systems  Constitutional: Negative for chills, fever, malaise/fatigue and weight loss.  HENT: Negative for hearing loss and sinus pain.   Eyes: Negative for blurred vision, double vision and photophobia.  Respiratory: Negative for cough and shortness of breath.   Cardiovascular: Negative for chest pain, palpitations and leg swelling.  Gastrointestinal: Negative for abdominal pain, nausea and vomiting.  Genitourinary: Negative for flank pain.  Musculoskeletal: Positive for back pain. Negative for myalgias.       Bilateral leg  Skin: Negative for itching and rash.  Neurological: Negative for tremors, focal weakness and weakness.  Endo/Heme/Allergies: Negative.   Psychiatric/Behavioral: Negative for depression.  All other systems reviewed and are negative.  Otherwise per HPI.  Assessment & Plan: Visit Diagnoses:  1. 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 Cerebral palsy, unspecified type ([place])     Plan: Findings:  Chronic worsening low back and bilateral radicular leg pain in the setting  of moderate multifactorial stenosis with mild scoliosis with underlying adult CP.  In the past patient did well with epidural injection intermittently.  Since the last year we have gotten very minimal relief with epidural injection.  She clearly has radicular complaints I do not think this is just facet mediated low back pain.  We thought it was possibly due the fact that she is not exercising as much as she used to during the coronavirus shutdown.  She has started that back now over the last several months  and has been doing much better in terms of exercise but still no pain relief.  Last injection was not very beneficial.  New MRI does show problems at L4-5 with disc protrusion moderate stenosis.  I think at least one time we will have her come back for interlaminar epidural injection just to see if doing this a different way may help her.  We explained the risk benefits of that.  We also are going to start her on a trial of Lyrica and I did talk about the issues with that in terms of what to look out for and she is eager to try that.  Alternatively depending on how this does with the injection we will have her see Dr. Louanne Skye in our office for second opinion on her spine.    Meds & Orders:  Meds ordered this encounter  Medications   pregabalin (LYRICA) 75 MG capsule    Sig: Take 1 capsule (75 mg total) by mouth 2 (two) times daily. Start with 1 cap at night for 7 nights then 2 x per day.    Dispense:  60 capsule    Refill:  1   No orders of the defined types were placed in this encounter.   Follow-up: Return for L5-S1 interlaminar epidural steroid injection..   Procedures: No procedures performed  No notes on file   Clinical History: MRI LUMBAR SPINE WITHOUT CONTRAST  TECHNIQUE: Multiplanar, multisequence MR imaging of the lumbar spine was performed. No intravenous contrast was administered.  COMPARISON: 01/19/2018  FINDINGS: Segmentation: Standard.  Alignment: Minimal retrolisthesis of L2 on L3.  Vertebrae: No fracture, evidence of discitis, or bone lesion.  Conus medullaris and cauda equina: Conus extends to the L1-2 level. Conus and cauda equina appear normal.  Paraspinal and other soft tissues: No acute paraspinal abnormality.  Disc levels:  Disc spaces: Degenerative disease with disc height loss at L4-5 and L5-S1 and to lesser extent L3-4.  T12-L1: No significant disc bulge. No evidence of neural foraminal stenosis. No central canal stenosis.  L1-L2: No significant  disc bulge. No evidence of neural foraminal stenosis. No central canal stenosis.  L2-L3: Mild broad-based disc bulge. Mild bilateral facet arthropathy. No evidence of neural foraminal stenosis. No central canal stenosis.  L3-L4: Mild broad-based disc bulge. Mild bilateral facet arthropathy. Bilateral lateral recess narrowing. No evidence of neural foraminal stenosis. Mild spinal stenosis.  L4-L5: Broad-based disc bulge flattening the ventral thecal sac. Moderate bilateral facet arthropathy. Bilateral lateral recess narrowing. Moderate spinal stenosis. Mild right foraminal stenosis. No left foraminal stenosis.  L5-S1: Broad-based disc bulge with a small central disc protrusion. Moderate bilateral facet arthropathy. No central canal stenosis.  IMPRESSION: 1. Lumbar spine spondylosis as described above. No significant interval change compared with the prior exam. 2. At L4-5 there is a broad-based disc bulge flattening the ventral thecal sac. Moderate bilateral facet arthropathy. Bilateral lateral recess narrowing. Moderate spinal stenosis. Mild right foraminal stenosis.   Electronically Signed  By:  Kathreen Devoid  On: 11/24/2018 12:23   She reports that she has never smoked. She has never used smokeless tobacco. No results for input(s): HGBA1C, LABURIC in the last 8760 hours.  Objective:  VS:  HT:4\' 11"  (149.9 cm)    WT:120 lb (54.4 kg)   BMI:24.22     BP:111/60   HR:60bpm   TEMP: ( )   RESP:  Physical Exam Vitals signs and nursing note reviewed.  Constitutional:      General: She is not in acute distress.    Appearance: Normal appearance. She is well-developed and normal weight.  HENT:     Head: Normocephalic and atraumatic.     Nose: Nose normal.     Mouth/Throat:     Mouth: Mucous membranes are moist.     Pharynx: Oropharynx is clear.  Eyes:     Conjunctiva/sclera: Conjunctivae normal.     Pupils: Pupils are equal, round, and reactive to light.  Neck:      Musculoskeletal: Normal range of motion and neck supple.  Cardiovascular:     Rate and Rhythm: Regular rhythm.  Pulmonary:     Effort: Pulmonary effort is normal. No respiratory distress.  Abdominal:     General: There is no distension.     Palpations: Abdomen is soft.     Tenderness: There is no guarding.  Musculoskeletal:     Right lower leg: No edema.     Left lower leg: No edema.     Comments: Patient has increased pain in the lower back and going from sit to stand and with facet loading.  She has no focal trigger points noted in the lower lumbar spine some tenderness over the PSIS and greater trochanters.  No pain with hip rotation good distal strength.  Skin:    General: Skin is warm and dry.     Findings: No erythema or rash.  Neurological:     General: No focal deficit present.     Mental Status: She is alert and oriented to person, place, and time.     Cranial Nerves: No cranial nerve deficit.     Sensory: No sensory deficit.     Motor: No weakness or abnormal muscle tone.     Coordination: Coordination abnormal.     Gait: Gait abnormal.  Psychiatric:        Mood and Affect: Mood normal.        Behavior: Behavior normal.        Thought Content: Thought content normal.     Ortho Exam Imaging: No results found.  Past Medical/Family/Surgical/Social History: Medications & Allergies reviewed per EMR, new medications updated. Patient Active Problem List   Diagnosis Date Noted   Paroxysmal tachycardia, unspecified ([place]) 03/17/2018   Cerebral palsy ([place]) 03/01/2018   Lumbar radiculopathy 03/01/2018   Spinal stenosis of lumbar region with neurogenic claudication 03/01/2018   Radiculopathy due to lumbar intervertebral disc disorder 03/01/2018   Migraine headache    Past Medical History:  Diagnosis Date   Cerebral palsy (HCC)    Mild symptoms.   Migraine headache    Family History  Problem Relation Age of Onset   Breast cancer Maternal Grandmother     Colon cancer Maternal Grandmother    Atrial fibrillation Mother 81       Status post pacemaker   Hypertension Mother    Stroke Mother    Hypercholesterolemia Mother    COPD Mother    Hypothyroidism Mother    CAD Mother  Diagnosed late   Atrial fibrillation Maternal Grandfather        Dilated age 22   Atrial fibrillation Maternal Uncle    Hodgkin's lymphoma Father        Died @ 36   Lung cancer Father    Pancreatic cancer Sister    Hypertension Sister    Hyperlipidemia Sister    Past Surgical History:  Procedure Laterality Date   CHOLECYSTECTOMY  1989   TEMPOROMANDIBULAR JOINT ARTHROPLASTY     Thyroid nodule resection  2005   TOTAL ABDOMINAL HYSTERECTOMY W/ BILATERAL SALPINGOOPHORECTOMY  1982   Social History   Occupational History   Not on file  Tobacco Use   Smoking status: Never Smoker   Smokeless tobacco: Never Used  Substance and Sexual Activity   Alcohol use: Never    Frequency: Never   Drug use: Never   Sexual activity: Not on file

## 2019-02-01 DIAGNOSIS — M9905 Segmental and somatic dysfunction of pelvic region: Secondary | ICD-10-CM | POA: Diagnosis not present

## 2019-02-01 DIAGNOSIS — M5137 Other intervertebral disc degeneration, lumbosacral region: Secondary | ICD-10-CM | POA: Diagnosis not present

## 2019-02-01 DIAGNOSIS — M25552 Pain in left hip: Secondary | ICD-10-CM | POA: Diagnosis not present

## 2019-02-01 DIAGNOSIS — M9903 Segmental and somatic dysfunction of lumbar region: Secondary | ICD-10-CM | POA: Diagnosis not present

## 2019-02-01 NOTE — Procedures (Signed)
Lumbar Epidural Steroid Injection - Interlaminar Approach with Fluoroscopic Guidance  Patient: Holly Mcconnell      Date of Birth: [birth year] MRN: [id number] PCP: Cari Caraway, MD      Visit Date: 01/13/2019   Universal Protocol:     Consent Given By: the patient  Position: PRONE  Additional Comments: Vital signs were monitored before and after the procedure. Patient was prepped and draped in the usual sterile fashion. The correct patient, procedure, and site was verified.   Injection Procedure Details:  Procedure Site One Meds Administered:  Meds ordered this encounter  Medications  . methylPREDNISolone acetate (DEPO-MEDROL) injection 80 mg     Laterality: Bilateral  Location/Site:  L5-S1  Needle size: 20 G  Needle type: Tuohy  Needle Placement: Paramedian epidural  Findings:   -Comments: Excellent flow of contrast into the epidural space.  Procedure Details: Using a paramedian approach from the side mentioned above, the region overlying the inferior lamina was localized under fluoroscopic visualization and the soft tissues overlying this structure were infiltrated with 4 ml. of 1% Lidocaine without Epinephrine. The Tuohy needle was inserted into the epidural space using a paramedian approach.   The epidural space was localized using loss of resistance along with lateral and bi-planar fluoroscopic views.  After negative aspirate for air, blood, and CSF, a 2 ml. volume of Isovue-250 was injected into the epidural space and the flow of contrast was observed. Radiographs were obtained for documentation purposes.    The injectate was administered into the level noted above.   Additional Comments:  The patient tolerated the procedure well Dressing: 2 x 2 sterile gauze and Band-Aid    Post-procedure details: Patient was observed during the procedure. Post-procedure instructions were reviewed.  Patient left the clinic in stable condition.

## 2019-02-01 NOTE — Progress Notes (Signed)
Holly Mcconnell - 73 y.o. female MRN [id number]  Date of birth: August 26, 1945  Office Visit Note: Visit Date: 01/13/2019 PCP: Cari Caraway, MD Referred by: Cari Caraway, MD  Subjective: Chief Complaint  Patient presents with  . Lower Back - Pain   HPI:  Holly Mcconnell is a 73 y.o. female who comes in today For planned L5-S1 interlaminar epidural steroid injection for chronic worsening low back pain and bilateral leg pain.  Please see our prior notes for further details and justification.  ROS Otherwise per HPI.  Assessment & Plan: Visit Diagnoses:  1. Lumbar radiculopathy     Plan: No additional findings.   Meds & Orders:  Meds ordered this encounter  Medications  . methylPREDNISolone acetate (DEPO-MEDROL) injection 80 mg    Orders Placed This Encounter  Procedures  . XR C-ARM NO REPORT  . Epidural Steroid injection    Follow-up: Return if symptoms worsen or fail to improve.   Procedures: No procedures performed  Lumbar Epidural Steroid Injection - Interlaminar Approach with Fluoroscopic Guidance  Patient: Holly Mcconnell      Date of Birth: Jul 24, 1945 MRN: [id number] PCP: Cari Caraway, MD      Visit Date: 01/13/2019   Universal Protocol:     Consent Given By: the patient  Position: PRONE  Additional Comments: Vital signs were monitored before and after the procedure. Patient was prepped and draped in the usual sterile fashion. The correct patient, procedure, and site was verified.   Injection Procedure Details:  Procedure Site One Meds Administered:  Meds ordered this encounter  Medications  . methylPREDNISolone acetate (DEPO-MEDROL) injection 80 mg     Laterality: Bilateral  Location/Site:  L5-S1  Needle size: 20 G  Needle type: Tuohy  Needle Placement: Paramedian epidural  Findings:   -Comments: Excellent flow of contrast into the epidural space.  Procedure Details: Using a paramedian approach from the side  mentioned above, the region overlying the inferior lamina was localized under fluoroscopic visualization and the soft tissues overlying this structure were infiltrated with 4 ml. of 1% Lidocaine without Epinephrine. The Tuohy needle was inserted into the epidural space using a paramedian approach.   The epidural space was localized using loss of resistance along with lateral and bi-planar fluoroscopic views.  After negative aspirate for air, blood, and CSF, a 2 ml. volume of Isovue-250 was injected into the epidural space and the flow of contrast was observed. Radiographs were obtained for documentation purposes.    The injectate was administered into the level noted above.   Additional Comments:  The patient tolerated the procedure well Dressing: 2 x 2 sterile gauze and Band-Aid    Post-procedure details: Patient was observed during the procedure. Post-procedure instructions were reviewed.  Patient left the clinic in stable condition.   Clinical History: MRI LUMBAR SPINE WITHOUT CONTRAST  TECHNIQUE: Multiplanar, multisequence MR imaging of the lumbar spine was performed. No intravenous contrast was administered.  COMPARISON: 01/19/2018  FINDINGS: Segmentation: Standard.  Alignment: Minimal retrolisthesis of L2 on L3.  Vertebrae: No fracture, evidence of discitis, or bone lesion.  Conus medullaris and cauda equina: Conus extends to the L1-2 level. Conus and cauda equina appear normal.  Paraspinal and other soft tissues: No acute paraspinal abnormality.  Disc levels:  Disc spaces: Degenerative disease with disc height loss at L4-5 and L5-S1 and to lesser extent L3-4.  T12-L1: No significant disc bulge. No evidence of neural foraminal stenosis. No central canal stenosis.  L1-L2: No significant disc bulge.  No evidence of neural foraminal stenosis. No central canal stenosis.  L2-L3: Mild broad-based disc bulge. Mild bilateral facet arthropathy. No evidence of neural  foraminal stenosis. No central canal stenosis.  L3-L4: Mild broad-based disc bulge. Mild bilateral facet arthropathy. Bilateral lateral recess narrowing. No evidence of neural foraminal stenosis. Mild spinal stenosis.  L4-L5: Broad-based disc bulge flattening the ventral thecal sac. Moderate bilateral facet arthropathy. Bilateral lateral recess narrowing. Moderate spinal stenosis. Mild right foraminal stenosis. No left foraminal stenosis.  L5-S1: Broad-based disc bulge with a small central disc protrusion. Moderate bilateral facet arthropathy. No central canal stenosis.  IMPRESSION: 1. Lumbar spine spondylosis as described above. No significant interval change compared with the prior exam. 2. At L4-5 there is a broad-based disc bulge flattening the ventral thecal sac. Moderate bilateral facet arthropathy. Bilateral lateral recess narrowing. Moderate spinal stenosis. Mild right foraminal stenosis.   Electronically Signed  By: Kathreen Devoid  On: 11/24/2018 12:23     Objective:  VS:  HT:    WT:   BMI:     BP:102/63  HR:(!) 54bpm  TEMP: ( )  RESP:  Physical Exam  Ortho Exam Imaging: No results found.

## 2019-02-03 DIAGNOSIS — M5137 Other intervertebral disc degeneration, lumbosacral region: Secondary | ICD-10-CM | POA: Diagnosis not present

## 2019-02-03 DIAGNOSIS — M9903 Segmental and somatic dysfunction of lumbar region: Secondary | ICD-10-CM | POA: Diagnosis not present

## 2019-02-03 DIAGNOSIS — M9905 Segmental and somatic dysfunction of pelvic region: Secondary | ICD-10-CM | POA: Diagnosis not present

## 2019-02-03 DIAGNOSIS — M25552 Pain in left hip: Secondary | ICD-10-CM | POA: Diagnosis not present

## 2019-03-02 ENCOUNTER — Other Ambulatory Visit: Payer: Self-pay

## 2019-03-02 ENCOUNTER — Ambulatory Visit (INDEPENDENT_AMBULATORY_CARE_PROVIDER_SITE_OTHER): Payer: PPO

## 2019-03-02 ENCOUNTER — Ambulatory Visit (INDEPENDENT_AMBULATORY_CARE_PROVIDER_SITE_OTHER): Payer: PPO | Admitting: Specialist

## 2019-03-02 ENCOUNTER — Encounter: Payer: Self-pay | Admitting: Specialist

## 2019-03-02 VITALS — BP 116/63 | HR 58 | Ht 59.0 in | Wt 125.0 lb

## 2019-03-02 DIAGNOSIS — M5126 Other intervertebral disc displacement, lumbar region: Secondary | ICD-10-CM

## 2019-03-02 DIAGNOSIS — M5416 Radiculopathy, lumbar region: Secondary | ICD-10-CM

## 2019-03-02 DIAGNOSIS — M5116 Intervertebral disc disorders with radiculopathy, lumbar region: Secondary | ICD-10-CM

## 2019-03-02 DIAGNOSIS — M419 Scoliosis, unspecified: Secondary | ICD-10-CM | POA: Diagnosis not present

## 2019-03-02 NOTE — Progress Notes (Signed)
Office Visit Note   Patient: Holly Mcconnell           Date of Birth: November 25, 1945           MRN: [id number] Visit Date: 03/02/2019              Requested by: Magnus Sinning, [place],  [place] [address] PCP: Cari Caraway, MD   Assessment & Plan: Visit Diagnoses:  1. Lumbar radiculopathy   2. 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 Scoliosis of thoracolumbar spine, unspecified scoliosis type     Plan: Plan: Avoid bending, stooping and avoid lifting weights greater than 10 lbs. Avoid prolong standing and walking. Avoid frequent bending and stooping  No lifting greater than 10 lbs. May use ice or moist heat for pain. Weight loss is of benefit. Handicap license is approved. Dr. Romona Curls secretary/Assistant will call to arrange for left leg EMGs and NCV to assess for a left S1 radiculopathy. If this is positive then I will order a left L5-S1 interlaminar epidural steroid injection. The disc protrusion at L5-S1 is likely causing the left sciatica symptoms and EMGs will help in the diagnosis as SNRB and ESI are not helping with the nerve pain.   Follow-Up Instructions: No follow-ups on file.   Orders:  Orders Placed This Encounter  Procedures  . XR Lumbar Spine 2-3 Views  . Ambulatory referral to Physical Medicine Rehab   No orders of the defined types were placed in this encounter.     Procedures: No procedures performed   Clinical Data: No additional findings.   Subjective: Chief Complaint  Patient presents with  . Lower Back - Pain    HPI  Review of Systems   Objective: Vital Signs: BP 116/63 (BP Location: Left Arm, Patient Position: Sitting)   Pulse (!) 58   Ht 4\' 11"  (1.499 m)   Wt 125 lb (56.7 kg)   BMI 25.25 kg/m   Physical Exam  Ortho Exam  Specialty Comments:  No specialty comments available.  Imaging: No results found.   PMFS History: Patient Active Problem List   Diagnosis Date Noted  . Paroxysmal tachycardia, unspecified ([place]) 03/17/2018  . Cerebral palsy ([place]) 03/01/2018  . Lumbar radiculopathy 03/01/2018  . 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 Radiculopathy due to lumbar intervertebral disc disorder 03/01/2018  . Migraine headache    Past Medical History:  Diagnosis Date  . Cerebral palsy ([facility])    Mild symptoms.  . Migraine headache     Family History  Problem Relation Age of Onset  . Breast cancer Maternal Grandmother   . Colon cancer Maternal Grandmother   . Atrial fibrillation Mother 2       Status post pacemaker  . Hypertension Mother   . Stroke Mother   . Hypercholesterolemia Mother   . COPD Mother   . Hypothyroidism Mother   . CAD Mother        Diagnosed late  . Atrial fibrillation Maternal Grandfather        Dilated age 19  . Atrial fibrillation Maternal Uncle   . Hodgkin's lymphoma Father        Died @ 22  . Lung cancer Father   . Pancreatic cancer Sister   . Hypertension Sister   . Hyperlipidemia Sister     Past Surgical History:  Procedure Laterality Date  . CHOLECYSTECTOMY  1989  . TEMPOROMANDIBULAR JOINT ARTHROPLASTY    .  Thyroid nodule resection  2005  . TOTAL ABDOMINAL HYSTERECTOMY W/ BILATERAL SALPINGOOPHORECTOMY  1982   Social History   Occupational History  . Not on file  Tobacco Use  . Smoking status: Never Smoker  . Smokeless tobacco: Never Used  Substance and Sexual Activity  . Alcohol use: Never  . Drug use: Never  . Sexual activity: Not on file

## 2019-03-02 NOTE — Patient Instructions (Signed)
Plan: Avoid bending, stooping and avoid lifting weights greater than 10 lbs. Avoid prolong standing and walking. Avoid frequent bending and stooping  No lifting greater than 10 lbs. May use ice or moist heat for pain. Weight loss is of benefit. Handicap license is approved. Dr. Romona Curls secretary/Assistant will call to arrange for left leg EMGs and NCV to assess for a left S1 radiculopathy. If this is positive then I will order a left L5-S1 interlaminar epidural steroid injection. The disc protrusion at L5-S1 is likely causing the left sciatica symptoms and EMGs will help in the diagnosis as SNRB and ESI are not helping with the nerve pain.

## 2019-03-03 DIAGNOSIS — Z20828 Contact with and (suspected) exposure to other viral communicable diseases: Secondary | ICD-10-CM | POA: Diagnosis not present

## 2019-03-05 DIAGNOSIS — Z20828 Contact with and (suspected) exposure to other viral communicable diseases: Secondary | ICD-10-CM | POA: Diagnosis not present

## 2019-03-07 ENCOUNTER — Ambulatory Visit: Payer: PPO

## 2019-03-07 ENCOUNTER — Other Ambulatory Visit: Payer: PPO

## 2019-03-08 ENCOUNTER — Ambulatory Visit
Admission: RE | Admit: 2019-03-08 | Discharge: 2019-03-08 | Disposition: A | Discharge status: Home / Self Care | Payer: PPO | Source: Ambulatory Visit | Attending: Family Medicine | Admitting: Family Medicine

## 2019-03-08 ENCOUNTER — Other Ambulatory Visit: Payer: Self-pay

## 2019-03-08 DIAGNOSIS — M8589 Other specified disorders of bone density and structure, multiple sites: Secondary | ICD-10-CM | POA: Diagnosis not present

## 2019-03-08 DIAGNOSIS — Z1231 Encounter for screening mammogram for malignant neoplasm of breast: Secondary | ICD-10-CM | POA: Diagnosis not present

## 2019-03-08 DIAGNOSIS — M8580 Other specified disorders of bone density and structure, unspecified site: Secondary | ICD-10-CM

## 2019-03-08 DIAGNOSIS — Z780 Asymptomatic menopausal state: Secondary | ICD-10-CM | POA: Diagnosis not present

## 2019-03-11 DIAGNOSIS — B349 Viral infection, unspecified: Secondary | ICD-10-CM | POA: Diagnosis not present

## 2019-03-12 DIAGNOSIS — B349 Viral infection, unspecified: Secondary | ICD-10-CM | POA: Diagnosis not present

## 2019-03-12 DIAGNOSIS — U071 COVID-19: Secondary | ICD-10-CM | POA: Diagnosis not present

## 2019-03-13 ENCOUNTER — Telehealth: Payer: Self-pay | Admitting: Unknown Physician Specialty

## 2019-03-13 ENCOUNTER — Other Ambulatory Visit: Payer: Self-pay | Admitting: Unknown Physician Specialty

## 2019-03-13 DIAGNOSIS — U071 COVID-19: Secondary | ICD-10-CM

## 2019-03-13 NOTE — Telephone Encounter (Signed)
Called to discuss with patient about Covid symptoms and the use of bamlanivimab, a monoclonal antibody infusion for those with mild to moderate Covid symptoms and at a high risk of hospitalization.  Pt is qualified for this infusion at the [facility] infusion center due to Age > 34   Unable to leave message

## 2019-03-13 NOTE — Telephone Encounter (Signed)
  I connected by phone with [name] on 03/13/2019 at 12:58 PM to discuss the potential use of an new treatment for mild to moderate COVID-19 viral infection in non-hospitalized patients.  This patient is a 74 y.o. female that meets the FDA criteria for Emergency Use Authorization of bamlanivimab or casirivimab\imdevimab.  Has a (+) direct SARS-CoV-2 viral test result  Has mild or moderate COVID-19   Is ? 74 years of age and weighs ? 40 kg  Is NOT hospitalized due to COVID-19  Is NOT requiring oxygen therapy or requiring an increase in baseline oxygen flow rate due to COVID-19  Is within 10 days of symptom onset  Has at least one of the high risk factor(s) for progression to severe COVID-19 and/or hospitalization as defined in EUA.  Specific high risk criteria : >/= 74 yo and cared for by PCP   I have spoken and communicated the following to the patient or parent/caregiver:  1. FDA has authorized the emergency use of bamlanivimab and casirivimab\imdevimab for the treatment of mild to moderate COVID-19 in adults and pediatric patients with positive results of direct SARS-CoV-2 viral testing who are 63 years of age and older weighing at least 40 kg, and who are at high risk for progressing to severe COVID-19 and/or hospitalization.  2. The significant known and potential risks and benefits of bamlanivimab and casirivimab\imdevimab, and the extent to which such potential risks and benefits are unknown.  3. Information on available alternative treatments and the risks and benefits of those alternatives, including clinical trials.  4. Patients treated with bamlanivimab and casirivimab\imdevimab should continue to self-isolate and use infection control measures (e.g., wear mask, isolate, social distance, avoid sharing personal items, clean and disinfect "high touch" surfaces, and frequent handwashing) according to CDC guidelines.   5. The patient or parent/caregiver has the  option to accept or refuse bamlanivimab or casirivimab\imdevimab .  After reviewing this information with the patient, The patient agreed to proceed with receiving the bamlanimivab infusion and will be provided a copy of the Fact sheet prior to receiving the infusion.Holly Mcconnell 03/13/2019 12:58 PM      Symptom onset 2/4

## 2019-03-16 ENCOUNTER — Ambulatory Visit (HOSPITAL_COMMUNITY)
Admission: RE | Admit: 2019-03-16 | Discharge: 2019-03-16 | Disposition: A | Discharge status: Home / Self Care | Payer: Medicare Other | Source: Ambulatory Visit | Attending: Pulmonary Disease | Admitting: Pulmonary Disease

## 2019-03-16 DIAGNOSIS — Z23 Encounter for immunization: Secondary | ICD-10-CM | POA: Diagnosis not present

## 2019-03-16 DIAGNOSIS — U071 COVID-19: Secondary | ICD-10-CM | POA: Diagnosis present

## 2019-03-16 MED ORDER — ALBUTEROL SULFATE HFA 108 (90 BASE) MCG/ACT IN AERS: 2.0000 | INHALATION_SPRAY | Freq: Once | RESPIRATORY_TRACT | Status: DC | PRN

## 2019-03-16 MED ORDER — FAMOTIDINE IN NACL 20-0.9 MG/50ML-% IV SOLN: 20.0000 mg | Freq: Once | INTRAVENOUS | Status: DC | PRN

## 2019-03-16 MED ORDER — SODIUM CHLORIDE 0.9 % IV SOLN
700.0000 mg | Freq: Once | INTRAVENOUS | Status: AC
  Administered 2019-03-16: 700 mg via INTRAVENOUS
  Filled 2019-03-16: qty 20

## 2019-03-16 MED ORDER — SODIUM CHLORIDE 0.9 % IV SOLN
INTRAVENOUS | Status: DC | PRN
  Administered 2019-03-16: 250 mL via INTRAVENOUS

## 2019-03-16 MED ORDER — DIPHENHYDRAMINE HCL 50 MG/ML IJ SOLN: 50.0000 mg | Freq: Once | INTRAMUSCULAR | Status: DC | PRN

## 2019-03-16 MED ORDER — METHYLPREDNISOLONE SODIUM SUCC 125 MG IJ SOLR: 125.0000 mg | Freq: Once | INTRAMUSCULAR | Status: DC | PRN

## 2019-03-16 MED ORDER — EPINEPHRINE 0.3 MG/0.3ML IJ SOAJ: 0.3000 mg | Freq: Once | INTRAMUSCULAR | Status: DC | PRN

## 2019-03-16 NOTE — Discharge Instructions (Signed)

## 2019-03-16 NOTE — Progress Notes (Signed)
  Diagnosis: COVID-19  Physician: Dr. Cari Caraway  Procedure: [facility] Med: bamlanivimab infusion - Provided patient with bamlanimivab fact sheet for patients, parents and caregivers prior to infusion.  Complications: No immediate complications noted.  Discharge: Discharged home   Hannahmarie Asberry L 03/16/2019

## 2019-03-18 ENCOUNTER — Encounter: Payer: Self-pay | Admitting: Physical Medicine and Rehabilitation

## 2019-03-29 DIAGNOSIS — M47897 Other spondylosis, lumbosacral region: Secondary | ICD-10-CM | POA: Diagnosis not present

## 2019-03-29 DIAGNOSIS — M47816 Spondylosis without myelopathy or radiculopathy, lumbar region: Secondary | ICD-10-CM | POA: Diagnosis not present

## 2019-03-29 DIAGNOSIS — E7800 Pure hypercholesterolemia, unspecified: Secondary | ICD-10-CM | POA: Diagnosis not present

## 2019-03-29 DIAGNOSIS — M8588 Other specified disorders of bone density and structure, other site: Secondary | ICD-10-CM | POA: Diagnosis not present

## 2019-03-29 DIAGNOSIS — E782 Mixed hyperlipidemia: Secondary | ICD-10-CM | POA: Diagnosis not present

## 2019-03-29 DIAGNOSIS — E039 Hypothyroidism, unspecified: Secondary | ICD-10-CM | POA: Diagnosis not present

## 2019-03-29 DIAGNOSIS — M19049 Primary osteoarthritis, unspecified hand: Secondary | ICD-10-CM | POA: Diagnosis not present

## 2019-03-30 DIAGNOSIS — M47816 Spondylosis without myelopathy or radiculopathy, lumbar region: Secondary | ICD-10-CM | POA: Diagnosis not present

## 2019-03-30 DIAGNOSIS — B349 Viral infection, unspecified: Secondary | ICD-10-CM | POA: Diagnosis not present

## 2019-03-30 DIAGNOSIS — G809 Cerebral palsy, unspecified: Secondary | ICD-10-CM | POA: Diagnosis not present

## 2019-03-30 DIAGNOSIS — M8588 Other specified disorders of bone density and structure, other site: Secondary | ICD-10-CM | POA: Diagnosis not present

## 2019-03-30 DIAGNOSIS — E039 Hypothyroidism, unspecified: Secondary | ICD-10-CM | POA: Diagnosis not present

## 2019-03-30 DIAGNOSIS — M859 Disorder of bone density and structure, unspecified: Secondary | ICD-10-CM | POA: Diagnosis not present

## 2019-03-30 DIAGNOSIS — Z20828 Contact with and (suspected) exposure to other viral communicable diseases: Secondary | ICD-10-CM | POA: Diagnosis not present

## 2019-03-30 DIAGNOSIS — E7800 Pure hypercholesterolemia, unspecified: Secondary | ICD-10-CM | POA: Diagnosis not present

## 2019-04-07 DIAGNOSIS — E673 Hypervitaminosis D: Secondary | ICD-10-CM | POA: Diagnosis not present

## 2019-04-07 DIAGNOSIS — Z1389 Encounter for screening for other disorder: Secondary | ICD-10-CM | POA: Diagnosis not present

## 2019-04-07 DIAGNOSIS — G43909 Migraine, unspecified, not intractable, without status migrainosus: Secondary | ICD-10-CM | POA: Diagnosis not present

## 2019-04-07 DIAGNOSIS — M47816 Spondylosis without myelopathy or radiculopathy, lumbar region: Secondary | ICD-10-CM | POA: Diagnosis not present

## 2019-04-07 DIAGNOSIS — Z0000 Encounter for general adult medical examination without abnormal findings: Secondary | ICD-10-CM | POA: Diagnosis not present

## 2019-04-07 DIAGNOSIS — G809 Cerebral palsy, unspecified: Secondary | ICD-10-CM | POA: Diagnosis not present

## 2019-04-07 DIAGNOSIS — E039 Hypothyroidism, unspecified: Secondary | ICD-10-CM | POA: Diagnosis not present

## 2019-04-07 DIAGNOSIS — M19049 Primary osteoarthritis, unspecified hand: Secondary | ICD-10-CM | POA: Diagnosis not present

## 2019-04-07 DIAGNOSIS — M8588 Other specified disorders of bone density and structure, other site: Secondary | ICD-10-CM | POA: Diagnosis not present

## 2019-04-07 DIAGNOSIS — E782 Mixed hyperlipidemia: Secondary | ICD-10-CM | POA: Diagnosis not present

## 2019-04-15 DIAGNOSIS — M79644 Pain in right finger(s): Secondary | ICD-10-CM | POA: Diagnosis not present

## 2019-04-15 DIAGNOSIS — M151 Heberden's nodes (with arthropathy): Secondary | ICD-10-CM | POA: Diagnosis not present

## 2019-04-27 DIAGNOSIS — H01111 Allergic dermatitis of right upper eyelid: Secondary | ICD-10-CM | POA: Diagnosis not present

## 2019-04-27 DIAGNOSIS — H02052 Trichiasis without entropian right lower eyelid: Secondary | ICD-10-CM | POA: Diagnosis not present

## 2019-05-04 DIAGNOSIS — H01111 Allergic dermatitis of right upper eyelid: Secondary | ICD-10-CM | POA: Diagnosis not present

## 2019-05-04 DIAGNOSIS — H01112 Allergic dermatitis of right lower eyelid: Secondary | ICD-10-CM | POA: Diagnosis not present

## 2019-05-12 ENCOUNTER — Other Ambulatory Visit: Payer: Self-pay

## 2019-05-12 ENCOUNTER — Ambulatory Visit (INDEPENDENT_AMBULATORY_CARE_PROVIDER_SITE_OTHER): Payer: PPO | Admitting: Diagnostic Neuroimaging

## 2019-05-12 ENCOUNTER — Encounter (INDEPENDENT_AMBULATORY_CARE_PROVIDER_SITE_OTHER): Payer: PPO | Admitting: Diagnostic Neuroimaging

## 2019-05-12 DIAGNOSIS — Z0289 Encounter for other administrative examinations: Secondary | ICD-10-CM

## 2019-05-12 DIAGNOSIS — M79604 Pain in right leg: Secondary | ICD-10-CM

## 2019-05-12 DIAGNOSIS — M79605 Pain in left leg: Secondary | ICD-10-CM

## 2019-05-16 NOTE — Procedures (Signed)
[facility]  NCS (NERVE CONDUCTION STUDY) WITH EMG (ELECTROMYOGRAPHY) REPORT   STUDY DATE: 05/12/19 PATIENT NAME: Holly Mcconnell DOB: October 02, 1945 MRN: [id number]  ORDERING CLINICIAN: Antionette Fairy, MD  TECHNOLOGIST: Sherre Scarlet ELECTROMYOGRAPHER: Earlean Polka. Alecea Trego, MD  CLINICAL INFORMATION: 74 year old female with lower extremity pain and weakness.  History of cerebral palsy affecting the right side.  Evaluate for left leg pain.  FINDINGS: NERVE CONDUCTION STUDY:  Bilateral peroneal and left tibial motor responses are normal.  Right tibial motor response has borderline decreased amplitude and normal conduction velocity.  Bilateral sural and superficial peroneal sensory responses are normal.  Bilateral tibial F wave latencies are normal.   NEEDLE ELECTROMYOGRAPHY:  Needle examination of bilateral lower extremities is normal.   IMPRESSION:  Normal study of left lower extremity.  Right lower extremity is normal except for right tibial motor response has borderline decreased amplitude (may be related to artifact and muscle spasms from cerebral palsy).  Needle examination of bilateral lower extremities is normal.    INTERPRETING PHYSICIAN:  Penni Bombard, MD Certified in Neurology, Neurophysiology and Neuroimaging  [facility] [address] [place], [place] [address] [phone]   [facility]    Nerve / Sites Muscle Latency Ref. Amplitude Ref. Rel Amp Segments Distance Velocity Ref. Area    ms ms mV mV %  cm m/s m/s mVms  L Peroneal - EDB     Ankle EDB 3.8 ?6.5 4.1 ?2.0 100 Ankle - EDB 9   14.5     Fib head EDB 8.7  3.6  88.8 Fib head - Ankle 25 52 ?44 13.3     Pop fossa EDB 10.4  3.6  98.7 Pop fossa - Fib head 10 59 ?44 13.4         Pop fossa - Ankle      R Peroneal - EDB     Ankle EDB 3.7 ?6.5 4.7 ?2.0 100 Ankle - EDB 9   14.3     Fib head EDB 8.4  3.8  80.7 Fib head - Ankle 25 54 ?44 13.0     Pop fossa EDB  10.2  4.3  112 Pop fossa - Fib head 10 55 ?44 13.1         Pop fossa - Ankle      L Tibial - AH     Ankle AH 3.8 ?5.8 5.7 ?4.0 100 Ankle - AH 9   11.9     Pop fossa AH 11.3  4.0  70.2 Pop fossa - Ankle 37 49 ?41 10.6  R Tibial - AH     Ankle AH 3.4 ?5.8 3.9 ?4.0 100 Ankle - AH 9   9.1     Pop fossa AH 10.8  3.5  90.6 Pop fossa - Ankle 37 50 ?41 10.3             SNC    Nerve / Sites Rec. Site Peak Lat Ref.  Amp Ref. Segments Distance    ms ms V V  cm  L Sural - Ankle (Calf)     Calf Ankle 3.1 ?4.4 9 ?6 Calf - Ankle 14  R Sural - Ankle (Calf)     Calf Ankle 3.8 ?4.4 8 ?6 Calf - Ankle 14  L Superficial peroneal - Ankle     Lat leg Ankle 3.5 ?4.4 9 ?6 Lat leg - Ankle 14  R Superficial peroneal - Ankle     Lat leg Ankle 3.9 ?  4.4 13 ?6 Lat leg - Ankle 14             F  Wave    Nerve F Lat Ref.   ms ms  L Tibial - AH 44.0 ?56.0  R Tibial - AH 45.1 ?56.0         EMG Summary Table    Spontaneous MUAP Recruitment  Muscle IA Fib PSW Fasc Other Amp Dur. Poly Pattern  R. Tibialis anterior Normal None None None _______ Normal Normal Normal Normal  R. Gastrocnemius (Medial head) Normal None None None _______ Normal Normal Normal Normal  L. Vastus medialis Normal None None None _______ Normal Normal Normal Normal  L. Tibialis anterior Normal None None None _______ Normal Normal Normal Normal  L. Gastrocnemius (Medial head) Normal None None None _______ Normal Normal Normal Normal

## 2019-05-18 ENCOUNTER — Encounter: Payer: Self-pay | Admitting: Specialist

## 2019-05-23 ENCOUNTER — Other Ambulatory Visit: Payer: Self-pay

## 2019-05-23 ENCOUNTER — Encounter: Payer: Self-pay | Admitting: Specialist

## 2019-05-23 ENCOUNTER — Ambulatory Visit (INDEPENDENT_AMBULATORY_CARE_PROVIDER_SITE_OTHER): Payer: PPO | Admitting: Specialist

## 2019-05-23 VITALS — BP 105/61 | HR 68 | Ht 59.0 in | Wt 125.0 lb

## 2019-05-23 DIAGNOSIS — M5416 Radiculopathy, lumbar region: Secondary | ICD-10-CM

## 2019-05-23 DIAGNOSIS — M419 Scoliosis, unspecified: Secondary | ICD-10-CM

## 2019-05-23 DIAGNOSIS — M48062 Spinal stenosis, lumbar region with neurogenic claudication: Secondary | ICD-10-CM

## 2019-05-23 DIAGNOSIS — M2170 Unequal limb length (acquired), unspecified site: Secondary | ICD-10-CM | POA: Diagnosis not present

## 2019-05-23 NOTE — Patient Instructions (Signed)
Avoid bending, stooping and avoid lifting weights greater than 10 lbs. Avoid prolong standing and walking. Avoid frequent bending and stooping  No lifting greater than 10 lbs. May use ice or moist heat for pain. Weight loss is of benefit. Handicap license is approved. Dr. Newton's secretary/Assistant will call to arrange for epidural steroid injection  

## 2019-05-23 NOTE — Progress Notes (Addendum)
Office Visit Note   Patient: Holly Mcconnell           Date of Birth: [birth year]           MRN: [id number] Visit Date: 05/23/2019              Requested by: Cari Caraway, [place],  [place] [id number] PCP: Cari Caraway, MD   Assessment & Plan: Visit Diagnoses:  1. Lumbar radiculopathy   2. Scoliosis of thoracolumbar spine, unspecified scoliosis type   3. Spinal stenosis of lumbar region with neurogenic claudication   She has mainly back pain now with PT/OT helping with strengthening the legs. Her EMG/NCV show no acute changes in the muscles of the Legs and she reports the pain that was leg pain is better so it is worth retrying the ESI right L4-5 Transforamenal as this helped in the past. Also she had recent COVID 19 URI  03/2019 with mainly sinus symptoms. She has not had vaccination and she is waiting the 3 months before considering the vaccination. I would recommend continuing conservative treatment as long as possible before surgery as surgery increases the likelihood that a fusion over a long area would become indicated.   Plan: Avoid bending, stooping and avoid lifting weights greater than 10 lbs. Avoid prolong standing and walking. Avoid frequent bending and stooping  No lifting greater than 10 lbs. May use ice or moist heat for pain. Weight loss is of benefit. Handicap license is approved. Dr. Romona Curls secretary/Assistant will call to arrange for epidural steroid injection   Follow-Up Instructions: No follow-ups on file.   Orders:  No orders of the defined types were placed in this encounter.  No orders of the defined types were placed in this encounter.     Procedures: No procedures performed   Clinical Data: No additional findings.   Subjective: Chief Complaint  Patient presents with  . Left Leg - Follow-up    Review of EMG/NCS of Left lower extremity    74 year old female with history of back pain and radiation into her  legs reports her leg pain is better but the back pain is worse.  She underwent recent EMGs and NCV with Dr. Ernestina Patches and these show some decrease in the right tibial signal but not conduction delay.   Review of Systems  Constitutional: Positive for fever and unexpected weight change. Negative for activity change, appetite change, chills and diaphoresis.  HENT: Positive for congestion.   Eyes: Negative for photophobia, pain, discharge, redness and visual disturbance.  Respiratory: Negative for apnea, cough, choking, chest tightness, shortness of breath, wheezing and stridor.   Cardiovascular: Negative.  Negative for chest pain, palpitations and leg swelling.  Gastrointestinal: Negative.   Endocrine: Negative.   Genitourinary: Negative.   Musculoskeletal: Positive for back pain and gait problem.  Skin: Negative.   Allergic/Immunologic: Negative.   Hematological: Negative.   Psychiatric/Behavioral: Negative.      Objective: Vital Signs: BP 105/61 (BP Location: Left Arm, Patient Position: Sitting)   Pulse 68   Ht 4\' 11"  (1.499 m)   Wt 125 lb (56.7 kg)   BMI 25.25 kg/m   Physical Exam  Ortho Exam  Specialty Comments:  No specialty comments available.  Imaging: No results found.   PMFS History: Patient Active Problem List   Diagnosis Date Noted  . Paroxysmal tachycardia, unspecified ([place]) 03/17/2018  . Cerebral palsy ([place]) 03/01/2018  . Lumbar radiculopathy 03/01/2018  . Spinal stenosis of lumbar  region with neurogenic claudication 03/01/2018  . Radiculopathy due to lumbar intervertebral disc disorder 03/01/2018  . Migraine headache    Past Medical History:  Diagnosis Date  . Cerebral palsy ([facility])    Mild symptoms.  . Migraine headache     Family History  Problem Relation Age of Onset  . Breast cancer Maternal Grandmother   . Colon cancer Maternal Grandmother   . Atrial fibrillation Mother 27       Status post pacemaker  . Hypertension Mother   . Stroke Mother     . Hypercholesterolemia Mother   . COPD Mother   . Hypothyroidism Mother   . CAD Mother        Diagnosed late  . Atrial fibrillation Maternal Grandfather        Dilated age 3  . Atrial fibrillation Maternal Uncle   . Hodgkin's lymphoma Father        Died @ 41  . Lung cancer Father   . Pancreatic cancer Sister   . Hypertension Sister   . Hyperlipidemia Sister     Past Surgical History:  Procedure Laterality Date  . CHOLECYSTECTOMY  1989  . TEMPOROMANDIBULAR JOINT ARTHROPLASTY    . Thyroid nodule resection  2005  . TOTAL ABDOMINAL HYSTERECTOMY W/ BILATERAL SALPINGOOPHORECTOMY  1982   Social History   Occupational History  . Not on file  Tobacco Use  . Smoking status: Never Smoker  . Smokeless tobacco: Never Used  Substance and Sexual Activity  . Alcohol use: Never  . Drug use: Never  . Sexual activity: Not on file

## 2019-05-23 NOTE — Addendum Note (Signed)
Addended by: Basil Dess on: 05/23/2019 11:37 AM   Modules accepted: Orders

## 2019-05-24 DIAGNOSIS — M47816 Spondylosis without myelopathy or radiculopathy, lumbar region: Secondary | ICD-10-CM | POA: Diagnosis not present

## 2019-05-24 DIAGNOSIS — E7800 Pure hypercholesterolemia, unspecified: Secondary | ICD-10-CM | POA: Diagnosis not present

## 2019-05-24 DIAGNOSIS — M47897 Other spondylosis, lumbosacral region: Secondary | ICD-10-CM | POA: Diagnosis not present

## 2019-05-24 DIAGNOSIS — E039 Hypothyroidism, unspecified: Secondary | ICD-10-CM | POA: Diagnosis not present

## 2019-05-24 DIAGNOSIS — M19049 Primary osteoarthritis, unspecified hand: Secondary | ICD-10-CM | POA: Diagnosis not present

## 2019-05-24 DIAGNOSIS — E782 Mixed hyperlipidemia: Secondary | ICD-10-CM | POA: Diagnosis not present

## 2019-06-13 DIAGNOSIS — H04123 Dry eye syndrome of bilateral lacrimal glands: Secondary | ICD-10-CM | POA: Diagnosis not present

## 2019-06-13 DIAGNOSIS — G43909 Migraine, unspecified, not intractable, without status migrainosus: Secondary | ICD-10-CM | POA: Diagnosis not present

## 2019-06-13 DIAGNOSIS — H25813 Combined forms of age-related cataract, bilateral: Secondary | ICD-10-CM | POA: Diagnosis not present

## 2019-06-13 DIAGNOSIS — H18452 Nodular corneal degeneration, left eye: Secondary | ICD-10-CM | POA: Diagnosis not present

## 2019-06-13 DIAGNOSIS — E039 Hypothyroidism, unspecified: Secondary | ICD-10-CM | POA: Diagnosis not present

## 2019-06-14 ENCOUNTER — Ambulatory Visit: Payer: Self-pay

## 2019-06-14 ENCOUNTER — Ambulatory Visit (INDEPENDENT_AMBULATORY_CARE_PROVIDER_SITE_OTHER): Payer: PPO | Admitting: Physical Medicine and Rehabilitation

## 2019-06-14 ENCOUNTER — Encounter: Payer: Self-pay | Admitting: Physical Medicine and Rehabilitation

## 2019-06-14 ENCOUNTER — Other Ambulatory Visit: Payer: Self-pay

## 2019-06-14 VITALS — BP 105/71 | HR 78

## 2019-06-14 DIAGNOSIS — M5416 Radiculopathy, lumbar region: Secondary | ICD-10-CM | POA: Diagnosis not present

## 2019-06-14 MED ORDER — METHYLPREDNISOLONE ACETATE 80 MG/ML IJ SUSP
40.0000 mg | Freq: Once | INTRAMUSCULAR | Status: AC
  Administered 2019-06-14: 40 mg

## 2019-06-14 NOTE — Progress Notes (Signed)
Numeric Pain Rating Scale and Functional Assessment Average Pain 10   In the last MONTH (on 0-10 scale) has pain interfered with the following?  1. General activity like being  able to carry out your everyday physical activities such as walking, climbing stairs, carrying groceries, or moving a chair?  Rating(10)   +Driver, -BT, -Dye Allergies. 

## 2019-06-15 NOTE — Progress Notes (Signed)
Holly Mcconnell - 74 y.o. female MRN [id number]  Date of birth: 03/13/1945  Office Visit Note: Visit Date: 06/14/2019 PCP: Cari Caraway, MD Referred by: Cari Caraway, MD  Subjective: Chief Complaint  Patient presents with  . Lower Back - Pain   HPI:  Holly Mcconnell is a 74 y.o. female who comes in today At the request of Dr. Basil Dess for right L4 transforaminal epidural steroid injection.  Patient having right low back and hip pain.  Since I have last seen her her leg pain is actually greatly improved since she has been going to the gym now for several months.  Those notes from myself can be reviewed as well as Dr. Merrilee Seashore.  She does have scoliosis with foraminal narrowing on the right and facet arthropathy.  She has a complicated history of adult cerebral palsy.  ROS Otherwise per HPI.  Assessment & Plan: Visit Diagnoses:  1. Lumbar radiculopathy     Plan: No additional findings.   Meds & Orders:  Meds ordered this encounter  Medications  . methylPREDNISolone acetate (DEPO-MEDROL) injection 40 mg    Orders Placed This Encounter  Procedures  . XR C-ARM NO REPORT  . Epidural Steroid injection    Follow-up: Return for Basil Dess, MD.   Procedures: No procedures performed  Lumbosacral Transforaminal Epidural Steroid Injection - Sub-Pedicular Approach with Fluoroscopic Guidance  Patient: Holly Mcconnell      Date of Birth: 13-May-1945 MRN: [id number] PCP: Cari Caraway, MD      Visit Date: 06/14/2019   Universal Protocol:    Date/Time: 06/14/2019  Consent Given By: the patient  Position: PRONE  Additional Comments: Vital signs were monitored before and after the procedure. Patient was prepped and draped in the usual sterile fashion. The correct patient, procedure, and site was verified.   Injection Procedure Details:  Procedure Site One Meds Administered:  Meds ordered this encounter  Medications  . methylPREDNISolone acetate  (DEPO-MEDROL) injection 40 mg    Laterality: Right  Location/Site:  L4-L5  Needle size: 22 G  Needle type: Spinal  Needle Placement: Transforaminal  Findings:    -Comments: Excellent flow of contrast along the nerve and into the epidural space.  Procedure Details: After squaring off the end-plates to get a true AP view, the C-arm was positioned so that an oblique view of the foramen as noted above was visualized. The target area is just inferior to the "nose of the scotty dog" or sub pedicular. The soft tissues overlying this structure were infiltrated with 2-3 ml. of 1% Lidocaine without Epinephrine.  The spinal needle was inserted toward the target using a "trajectory" view along the fluoroscope beam.  Under AP and lateral visualization, the needle was advanced so it did not puncture dura and was located close the 6 O'Clock position of the pedical in AP tracterory. Biplanar projections were used to confirm position. Aspiration was confirmed to be negative for CSF and/or blood. A 1-2 ml. volume of Isovue-250 was injected and flow of contrast was noted at each level. Radiographs were obtained for documentation purposes.   After attaining the desired flow of contrast documented above, a 0.5 to 1.0 ml test dose of 0.25% Marcaine was injected into each respective transforaminal space.  The patient was observed for 90 seconds post injection.  After no sensory deficits were reported, and normal lower extremity motor function was noted,   the above injectate was administered so that equal amounts of the injectate were placed at  each foramen (level) into the transforaminal epidural space.   Additional Comments:  The patient tolerated the procedure well Dressing: 2 x 2 sterile gauze and Band-Aid    Post-procedure details: Patient was observed during the procedure. Post-procedure instructions were reviewed.  Patient left the clinic in stable condition.      Clinical History: MRI  LUMBAR SPINE WITHOUT CONTRAST  TECHNIQUE: Multiplanar, multisequence MR imaging of the lumbar spine was performed. No intravenous contrast was administered.  COMPARISON: 01/19/2018  FINDINGS: Segmentation: Standard.  Alignment: Minimal retrolisthesis of L2 on L3.  Vertebrae: No fracture, evidence of discitis, or bone lesion.  Conus medullaris and cauda equina: Conus extends to the L1-2 level. Conus and cauda equina appear normal.  Paraspinal and other soft tissues: No acute paraspinal abnormality.  Disc levels:  Disc spaces: Degenerative disease with disc height loss at L4-5 and L5-S1 and to lesser extent L3-4.  T12-L1: No significant disc bulge. No evidence of neural foraminal stenosis. No central canal stenosis.  L1-L2: No significant disc bulge. No evidence of neural foraminal stenosis. No central canal stenosis.  L2-L3: Mild broad-based disc bulge. Mild bilateral facet arthropathy. No evidence of neural foraminal stenosis. No central canal stenosis.  L3-L4: Mild broad-based disc bulge. Mild bilateral facet arthropathy. Bilateral lateral recess narrowing. No evidence of neural foraminal stenosis. Mild spinal stenosis.  L4-L5: Broad-based disc bulge flattening the ventral thecal sac. Moderate bilateral facet arthropathy. Bilateral lateral recess narrowing. Moderate spinal stenosis. Mild right foraminal stenosis. No left foraminal stenosis.  L5-S1: Broad-based disc bulge with a small central disc protrusion. Moderate bilateral facet arthropathy. No central canal stenosis.  IMPRESSION: 1. Lumbar spine spondylosis as described above. No significant interval change compared with the prior exam. 2. At L4-5 there is a broad-based disc bulge flattening the ventral thecal sac. Moderate bilateral facet arthropathy. Bilateral lateral recess narrowing. Moderate spinal stenosis. Mild right foraminal stenosis.   Electronically Signed  By: Kathreen Devoid  On: 11/24/2018  12:23     Objective:  VS:  HT:    WT:   BMI:     BP:105/71  HR:78bpm  TEMP: ( )  RESP:  Physical Exam  Ortho Exam Imaging: XR C-ARM NO REPORT  Result Date: 06/14/2019 Please see Notes tab for imaging impression.

## 2019-06-15 NOTE — Procedures (Signed)
Lumbosacral Transforaminal Epidural Steroid Injection - Sub-Pedicular Approach with Fluoroscopic Guidance  Patient: Holly Mcconnell      Date of Birth: 12/14/1945 MRN: [id number] PCP: Cari Caraway, MD      Visit Date: 06/14/2019   Universal Protocol:    Date/Time: 06/14/2019  Consent Given By: the patient  Position: PRONE  Additional Comments: Vital signs were monitored before and after the procedure. Patient was prepped and draped in the usual sterile fashion. The correct patient, procedure, and site was verified.   Injection Procedure Details:  Procedure Site One Meds Administered:  Meds ordered this encounter  Medications  . methylPREDNISolone acetate (DEPO-MEDROL) injection 40 mg    Laterality: Right  Location/Site:  L4-L5  Needle size: 22 G  Needle type: Spinal  Needle Placement: Transforaminal  Findings:    -Comments: Excellent flow of contrast along the nerve and into the epidural space.  Procedure Details: After squaring off the end-plates to get a true AP view, the C-arm was positioned so that an oblique view of the foramen as noted above was visualized. The target area is just inferior to the "nose of the scotty dog" or sub pedicular. The soft tissues overlying this structure were infiltrated with 2-3 ml. of 1% Lidocaine without Epinephrine.  The spinal needle was inserted toward the target using a "trajectory" view along the fluoroscope beam.  Under AP and lateral visualization, the needle was advanced so it did not puncture dura and was located close the 6 O'Clock position of the pedical in AP tracterory. Biplanar projections were used to confirm position. Aspiration was confirmed to be negative for CSF and/or blood. A 1-2 ml. volume of Isovue-250 was injected and flow of contrast was noted at each level. Radiographs were obtained for documentation purposes.   After attaining the desired flow of contrast documented above, a 0.5 to 1.0 ml test dose  of 0.25% Marcaine was injected into each respective transforaminal space.  The patient was observed for 90 seconds post injection.  After no sensory deficits were reported, and normal lower extremity motor function was noted,   the above injectate was administered so that equal amounts of the injectate were placed at each foramen (level) into the transforaminal epidural space.   Additional Comments:  The patient tolerated the procedure well Dressing: 2 x 2 sterile gauze and Band-Aid    Post-procedure details: Patient was observed during the procedure. Post-procedure instructions were reviewed.  Patient left the clinic in stable condition.

## 2019-06-16 DIAGNOSIS — E039 Hypothyroidism, unspecified: Secondary | ICD-10-CM | POA: Diagnosis not present

## 2019-06-16 DIAGNOSIS — Z9889 Other specified postprocedural states: Secondary | ICD-10-CM | POA: Diagnosis not present

## 2019-06-16 DIAGNOSIS — Z8639 Personal history of other endocrine, nutritional and metabolic disease: Secondary | ICD-10-CM | POA: Diagnosis not present

## 2019-06-16 DIAGNOSIS — Z7189 Other specified counseling: Secondary | ICD-10-CM | POA: Diagnosis not present

## 2019-06-23 ENCOUNTER — Encounter: Payer: Self-pay | Admitting: Physical Medicine and Rehabilitation

## 2019-07-04 ENCOUNTER — Other Ambulatory Visit: Payer: Self-pay

## 2019-07-04 ENCOUNTER — Encounter: Payer: Self-pay | Admitting: Specialist

## 2019-07-04 ENCOUNTER — Ambulatory Visit: Payer: PPO | Admitting: Specialist

## 2019-07-04 VITALS — BP 95/58 | HR 65 | Ht 59.0 in | Wt 125.0 lb

## 2019-07-04 DIAGNOSIS — M2170 Unequal limb length (acquired), unspecified site: Secondary | ICD-10-CM | POA: Diagnosis not present

## 2019-07-04 DIAGNOSIS — M419 Scoliosis, unspecified: Secondary | ICD-10-CM

## 2019-07-04 DIAGNOSIS — M5126 Other intervertebral disc displacement, lumbar region: Secondary | ICD-10-CM

## 2019-07-04 DIAGNOSIS — M48062 Spinal stenosis, lumbar region with neurogenic claudication: Secondary | ICD-10-CM | POA: Diagnosis not present

## 2019-07-04 DIAGNOSIS — M5416 Radiculopathy, lumbar region: Secondary | ICD-10-CM

## 2019-07-04 DIAGNOSIS — M5116 Intervertebral disc disorders with radiculopathy, lumbar region: Secondary | ICD-10-CM

## 2019-07-04 MED ORDER — LEVOTHYROXINE SODIUM 75 MCG PO TABS: 75.0000 ug | ORAL_TABLET | Freq: Every day | ORAL | 3 refills | Status: DC

## 2019-07-04 MED ORDER — PREGABALIN 25 MG PO CAPS: 25.0000 mg | ORAL_CAPSULE | Freq: Two times a day (BID) | ORAL | 2 refills | Status: DC

## 2019-07-04 NOTE — Patient Instructions (Signed)
Avoid bending, stooping and avoid lifting weights greater than 10 lbs. Avoid prolong standing and walking. Avoid frequent bending and stooping  No lifting greater than 10 lbs. May use ice or moist heat for pain. Weight loss is of benefit. Handicap license is approved. Dr. Romona Curls secretary/Assistant will call to arrange for evaluation for spinal cord stimulator. Lyrica at 25 mg qhs for a couple days the BID.  No NSIADs.

## 2019-07-04 NOTE — Progress Notes (Signed)
Office Visit Note   Patient: Holly Mcconnell           Date of Birth: 07/23/1945           MRN: [id number] Visit Date: 07/04/2019              Requested by: Cari Caraway, [place],  [place] [address] PCP: Cari Caraway, MD   Assessment & Plan: Visit Diagnoses:  1. Scoliosis of thoracolumbar spine, unspecified scoliosis type   2. Leg length discrepancy   3. Spinal stenosis of lumbar region with neurogenic claudication   4. Lumbar radiculopathy   5. 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 Avoid prolong standing and walking. Avoid frequent bending and stooping  No lifting greater than 10 lbs. May use ice or moist heat for pain. Weight loss is of benefit. Handicap license is approved. Dr. Romona Curls secretary/Assistant will call to arrange for evaluation for spinal cord stimulator. Lyrica at 25 mg qhs for a couple days the BID.  No NSIADs.   Follow-Up Instructions: Return in about 6 weeks (around 08/15/2019).   Orders:  Orders Placed This Encounter  Procedures  . Ambulatory referral to Physical Medicine Rehab   Meds ordered this encounter  Medications  . pregabalin (LYRICA) 25 MG capsule    Sig: Take 1 capsule (25 mg total) by mouth 2 (two) times daily.    Dispense:  60 capsule    Refill:  2      Procedures: No procedures performed   Clinical Data: No additional findings.   Subjective: Chief Complaint  Patient presents with  . Lower Back - Follow-up    She had a Right L4-5 TF injection with Dr. Ernestina Patches on 06/14/19, she states that it helped for first few days    74 year old female with history of lumbar spinal stenosis she has a collapsing degenerative scoliosis with right sided back and leg pain. She has pain with standing and walking. There right Leg pain is much better since exercising and returning to the gym. She  had right L4-5 TF esi and reports relief of pain for only about 2-3 days. Now the pain in the back has returned. The leg pain is gone.No bowel or bladder concern. Has to use a walker for balance and coordination. Tried alleve and it helped but had to stop due to history of cardiac concerns. ES tylenol without help. She is on generic Boniva once a month and vitamin D and calcium. She is going [facility]. She is doind well with therapy and is able to walk though distances are shortened.   Review of Systems  Constitutional: Negative.  Negative for activity change, appetite change, chills, diaphoresis, fatigue, fever and unexpected weight change.  HENT: Negative.  Negative for congestion, dental problem, drooling, ear discharge, ear pain, facial swelling, hearing loss, mouth sores, nosebleeds, postnasal drip, rhinorrhea, sinus pressure, sinus pain, sneezing, sore throat, tinnitus, trouble swallowing and voice change.   Eyes: Negative.   Respiratory: Negative.   Cardiovascular: Negative.   Gastrointestinal: Negative.   Endocrine: Negative.  Negative for cold intolerance, heat intolerance, polydipsia, polyphagia and polyuria.  Genitourinary: Negative.   Musculoskeletal: Positive for back pain and gait problem. Negative for arthralgias, joint swelling, myalgias, neck pain and neck stiffness.  Skin: Negative.  Negative for color change, pallor, rash and wound.  Allergic/Immunologic: Negative.  Negative for environmental allergies, food allergies and immunocompromised state.  Neurological: Positive for weakness. Negative for dizziness, tremors, seizures, syncope, facial asymmetry, speech difficulty, light-headedness, numbness and headaches.  Hematological: Negative.  Negative for adenopathy. Does not bruise/bleed easily.  Psychiatric/Behavioral: Negative.  Negative for agitation, behavioral problems, confusion, decreased concentration, dysphoric mood, hallucinations, self-injury, sleep  disturbance and suicidal ideas. The patient is not nervous/anxious and is not hyperactive.      Objective: Vital Signs: BP (!) 95/58 (BP Location: Left Arm, Patient Position: Sitting)   Pulse 65   Ht 4\' 11"  (1.499 m)   Wt 125 lb (56.7 kg)   BMI 25.25 kg/m   Physical Exam Constitutional:      Appearance: She is well-developed.  HENT:     Head: Normocephalic and atraumatic.  Eyes:     Pupils: Pupils are equal, round, and reactive to light.  Pulmonary:     Effort: Pulmonary effort is normal.     Breath sounds: Normal breath sounds.  Abdominal:     General: Bowel sounds are normal.     Palpations: Abdomen is soft.  Musculoskeletal:        General: Normal range of motion.     Cervical back: Normal range of motion and neck supple.     Lumbar back: Negative right straight leg raise test and negative left straight leg raise test.  Skin:    General: Skin is warm and dry.  Neurological:     Mental Status: She is alert and oriented to person, place, and time.  Psychiatric:        Behavior: Behavior normal.        Thought Content: Thought content normal.        Judgment: Judgment normal.     Back Exam   Tenderness  The patient is experiencing tenderness in the lumbar.  Range of Motion  Extension: normal  Flexion: normal  Lateral bend right: normal  Lateral bend left: normal  Rotation right: normal  Rotation left: normal   Muscle Strength  Right Quadriceps:  5/5  Left Quadriceps:  5/5  Right Hamstrings:  5/5  Left Hamstrings:  5/5   Tests  Straight leg raise right: negative Straight leg raise left: negative  Reflexes  Patellar: 2/4 Achilles: 2/4  Other  Toe walk: normal Heel walk: normal Sensation: normal Gait: normal  Erythema: no back redness Scars: absent      Specialty Comments:  No specialty comments available.  Imaging: No results found.   PMFS History: Patient Active Problem List   Diagnosis Date Noted  . Paroxysmal tachycardia,  unspecified ([place]) 03/17/2018  . Cerebral palsy ([place]) 03/01/2018  . Lumbar radiculopathy 03/01/2018  . 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 Radiculopathy due to lumbar intervertebral disc disorder 03/01/2018  . Migraine headache    Past Medical History:  Diagnosis Date  . Cerebral palsy ([facility])    Mild symptoms.  . Migraine headache     Family History  Problem Relation Age of Onset  . Breast cancer Maternal Grandmother   . Colon cancer Maternal Grandmother   . Atrial fibrillation Mother 72       Status post pacemaker  . Hypertension Mother   . Stroke Mother   . Hypercholesterolemia Mother   . COPD Mother   . Hypothyroidism Mother   . CAD Mother        Diagnosed late  . Atrial fibrillation Maternal Grandfather        Dilated age  6  . Atrial fibrillation Maternal Uncle   . Hodgkin's lymphoma Father        Died @ 24  . Lung cancer Father   . Pancreatic cancer Sister   . Hypertension Sister   . Hyperlipidemia Sister     Past Surgical History:  Procedure Laterality Date  . CHOLECYSTECTOMY  1989  . TEMPOROMANDIBULAR JOINT ARTHROPLASTY    . Thyroid nodule resection  2005  . TOTAL ABDOMINAL HYSTERECTOMY W/ BILATERAL SALPINGOOPHORECTOMY  1982   Social History   Occupational History  . Not on file  Tobacco Use  . Smoking status: Never Smoker  . Smokeless tobacco: Never Used  Substance and Sexual Activity  . Alcohol use: Never  . Drug use: Never  . Sexual activity: Not on file

## 2019-07-05 DIAGNOSIS — M151 Heberden's nodes (with arthropathy): Secondary | ICD-10-CM | POA: Diagnosis not present

## 2019-07-05 DIAGNOSIS — M79644 Pain in right finger(s): Secondary | ICD-10-CM | POA: Diagnosis not present

## 2019-07-06 DIAGNOSIS — E039 Hypothyroidism, unspecified: Secondary | ICD-10-CM | POA: Diagnosis not present

## 2019-07-06 DIAGNOSIS — M47816 Spondylosis without myelopathy or radiculopathy, lumbar region: Secondary | ICD-10-CM | POA: Diagnosis not present

## 2019-07-06 DIAGNOSIS — G809 Cerebral palsy, unspecified: Secondary | ICD-10-CM | POA: Diagnosis not present

## 2019-07-06 DIAGNOSIS — Z0000 Encounter for general adult medical examination without abnormal findings: Secondary | ICD-10-CM | POA: Diagnosis not present

## 2019-07-06 DIAGNOSIS — E782 Mixed hyperlipidemia: Secondary | ICD-10-CM | POA: Diagnosis not present

## 2019-07-06 DIAGNOSIS — E673 Hypervitaminosis D: Secondary | ICD-10-CM | POA: Diagnosis not present

## 2019-07-06 DIAGNOSIS — Z1389 Encounter for screening for other disorder: Secondary | ICD-10-CM | POA: Diagnosis not present

## 2019-07-06 DIAGNOSIS — M8588 Other specified disorders of bone density and structure, other site: Secondary | ICD-10-CM | POA: Diagnosis not present

## 2019-07-06 DIAGNOSIS — M19049 Primary osteoarthritis, unspecified hand: Secondary | ICD-10-CM | POA: Diagnosis not present

## 2019-07-20 ENCOUNTER — Ambulatory Visit: Payer: PPO | Admitting: Physical Medicine and Rehabilitation

## 2019-07-20 ENCOUNTER — Encounter: Payer: Self-pay | Admitting: Physical Medicine and Rehabilitation

## 2019-07-20 ENCOUNTER — Other Ambulatory Visit: Payer: Self-pay

## 2019-07-20 VITALS — BP 99/59 | HR 60 | Ht 59.0 in | Wt 128.0 lb

## 2019-07-20 DIAGNOSIS — M48062 Spinal stenosis, lumbar region with neurogenic claudication: Secondary | ICD-10-CM | POA: Diagnosis not present

## 2019-07-20 DIAGNOSIS — G894 Chronic pain syndrome: Secondary | ICD-10-CM | POA: Diagnosis not present

## 2019-07-20 DIAGNOSIS — M5416 Radiculopathy, lumbar region: Secondary | ICD-10-CM

## 2019-07-20 DIAGNOSIS — G809 Cerebral palsy, unspecified: Secondary | ICD-10-CM

## 2019-07-20 NOTE — Progress Notes (Signed)
Pt states pain in the right side of the lower back. Pt states last injection 06/14/19 only gave her a couple of hours of relief. Pt states walking and standing in one place makes pain worse. Sitting and relaxing helps with pain.   .Numeric Pain Rating Scale and Functional Assessment Average Pain 9 Pain Right Now 10 My pain is constant and dull Pain is worse with: walking and standing Pain improves with: rest   In the last MONTH (on 0-10 scale) has pain interfered with the following?  1. General activity like being  able to carry out your everyday physical activities such as walking, climbing stairs, carrying groceries, or moving a chair?  Rating(8)  2. Relation with others like being able to carry out your usual social activities and roles such as  activities at home, at work and in your community. Rating(8)  3. Enjoyment of life such that you have  been bothered by emotional problems such as feeling anxious, depressed or irritable?  Rating(5)

## 2019-07-21 ENCOUNTER — Encounter: Payer: Self-pay | Admitting: Physical Medicine and Rehabilitation

## 2019-07-21 DIAGNOSIS — M5416 Radiculopathy, lumbar region: Secondary | ICD-10-CM

## 2019-07-21 DIAGNOSIS — M48062 Spinal stenosis, lumbar region with neurogenic claudication: Secondary | ICD-10-CM

## 2019-07-21 DIAGNOSIS — G894 Chronic pain syndrome: Secondary | ICD-10-CM

## 2019-07-21 NOTE — Progress Notes (Signed)
Holly Mcconnell - 74 y.o. female MRN [id number]  Date of birth: 05/17/1945  Office Visit Note: Visit Date: 07/20/2019 PCP: Cari Caraway, MD Referred by: Cari Caraway, MD  Subjective: Chief Complaint  Patient presents with  . Lower Back - Pain   HPI: Holly Mcconnell is a 74 y.o. female who comes in today For evaluation management and particular evaluation for possible spinal cord stimulator trial as requested by Dr. Basil Dess.  I have seen the patient in the past on numerous occasions and those notes can be fully reviewed.  She reports average pain of 9-10 out of 10 which is constant and dull across the lower back at times she gets referral down the legs but that has not been as problematic as of recent.  She is an active exercise in gym participant.  In fact she had more increasing pain last year during the corona pandemic because she could not get to the gym.  She has adult cerebral palsy.  She has had various medication trials with pain medication and muscle relaxer and anti-inflammatory without any relief.  She has had epidural injections and facet joint blocks with only temporary relief.  Last injection in April gave her a few hours of relief in general.  Multiple epidural injections have always been beneficial but just not long lived.  She has multifactorial stenosis at L4-5 with mild thoracolumbar scoliosis.  Dr. Louanne Skye had contemplated surgery with her but then felt like with her other conditions it might be beneficial to still seek out more conservative type care if you will or more advanced interventional care instead of the surgery.  She has had no focal weakness no bowel or bladder changes no red flag complaints.  MRI was again reviewed with the patient and reviewed below.  Review of Systems  Musculoskeletal: Positive for back pain.       Bilateral hip and leg  All other systems reviewed and are negative.  Otherwise per HPI.  Assessment & Plan: Visit Diagnoses:    1. Spinal stenosis of lumbar region with neurogenic claudication   2. Lumbar radiculopathy   3. Chronic pain syndrome   4. Cerebral palsy, unspecified type ([place])     Plan: Findings:  Chronic worsening recalcitrant low back and bilateral hip and leg pain without relief with conservative care including physical therapy and massage and other modalities as well as interventional spine procedures.  Epidural injections were beneficial but short-lived.  Patient does have a lumbar stenosis at L4-5.  Her case is complicated by adult cerebral palsy.  She is very physically active and continues to work out.  I think she would be a good candidate for stimulator trial as this is a procedure of last resort in some ways.  We did discuss the fact that she could have surgery even if the stimulator was placed if that ever needed to be done.  Other avenues would be facet joint blocks although she is always had issues with ongoing leg pain bilaterally.  We will set up a pretrial and preimplantation psychological evaluation.  And the plan would be to start this process if she would like.  I have asked her to review the literature that I have given her and let us know which way she would like to proceed.    Meds & Orders: No orders of the defined types were placed in this encounter.  No orders of the defined types were placed in this encounter.   Follow-up: Return if  symptoms worsen or fail to improve, for Psychological evaluation for potential spinal cord stimulator.   Procedures: No procedures performed  No notes on file   Clinical History: MRI LUMBAR SPINE WITHOUT CONTRAST  TECHNIQUE: Multiplanar, multisequence MR imaging of the lumbar spine was performed. No intravenous contrast was administered.  COMPARISON: 01/19/2018  FINDINGS: Segmentation: Standard.  Alignment: Minimal retrolisthesis of L2 on L3.  Vertebrae: No fracture, evidence of discitis, or bone lesion.  Conus medullaris and cauda equina:  Conus extends to the L1-2 level. Conus and cauda equina appear normal.  Paraspinal and other soft tissues: No acute paraspinal abnormality.  Disc levels:  Disc spaces: Degenerative disease with disc height loss at L4-5 and L5-S1 and to lesser extent L3-4.  T12-L1: No significant disc bulge. No evidence of neural foraminal stenosis. No central canal stenosis.  L1-L2: No significant disc bulge. No evidence of neural foraminal stenosis. No central canal stenosis.  L2-L3: Mild broad-based disc bulge. Mild bilateral facet arthropathy. No evidence of neural foraminal stenosis. No central canal stenosis.  L3-L4: Mild broad-based disc bulge. Mild bilateral facet arthropathy. Bilateral lateral recess narrowing. No evidence of neural foraminal stenosis. Mild spinal stenosis.  L4-L5: Broad-based disc bulge flattening the ventral thecal sac. Moderate bilateral facet arthropathy. Bilateral lateral recess narrowing. Moderate spinal stenosis. Mild right foraminal stenosis. No left foraminal stenosis.  L5-S1: Broad-based disc bulge with a small central disc protrusion. Moderate bilateral facet arthropathy. No central canal stenosis.  IMPRESSION: 1. Lumbar spine spondylosis as described above. No significant interval change compared with the prior exam. 2. At L4-5 there is a broad-based disc bulge flattening the ventral thecal sac. Moderate bilateral facet arthropathy. Bilateral lateral recess narrowing. Moderate spinal stenosis. Mild right foraminal stenosis.   Electronically Signed  By: Kathreen Devoid  On: 11/24/2018 12:23   She reports that she has never smoked. She has never used smokeless tobacco. No results for input(s): HGBA1C, LABURIC in the last 8760 hours.  Objective:  VS:  HT:4\' 11"  (149.9 cm)   WT:128 lb (58.1 kg)  BMI:25.84    BP:(!) 99/59  HR:60bpm  TEMP: ( )  RESP:  Physical Exam Vitals and nursing note reviewed.  Constitutional:      General: She is not in acute  distress.    Appearance: Normal appearance. She is well-developed. She is not ill-appearing.  HENT:     Head: Normocephalic and atraumatic.  Eyes:     Conjunctiva/sclera: Conjunctivae normal.     Pupils: Pupils are equal, round, and reactive to light.  Cardiovascular:     Rate and Rhythm: Normal rate.     Pulses: Normal pulses.  Pulmonary:     Effort: Pulmonary effort is normal.  Musculoskeletal:     Right lower leg: No edema.     Left lower leg: No edema.     Comments: Patient ambulates with a walker.  She is slow to rise from a seated position.  She does have spasticity do to her cerebral palsy.  She has some myofascial trigger point pain across the paraspinal region in the lower spine.  No pain with hip rotation she has good distal strength but with increase spasticity.  Skin:    General: Skin is warm and dry.     Findings: No erythema or rash.  Neurological:     General: No focal deficit present.     Mental Status: She is alert and oriented to person, place, and time.     Sensory: No sensory deficit.  Motor: No weakness or abnormal muscle tone.     Coordination: Coordination normal.     Gait: Gait abnormal.  Psychiatric:        Mood and Affect: Mood normal.        Behavior: Behavior normal.     Ortho Exam  Imaging: No results found.  Past Medical/Family/Surgical/Social History: Medications & Allergies reviewed per EMR, new medications updated. Patient Active Problem List   Diagnosis Date Noted  . Paroxysmal tachycardia, unspecified ([place]) 03/17/2018  . Cerebral palsy ([place]) 03/01/2018  . Lumbar radiculopathy 03/01/2018  . 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 Radiculopathy due to lumbar intervertebral disc disorder 03/01/2018  . Migraine headache    Past Medical History:  Diagnosis Date  . Cerebral palsy (HCC)    Mild symptoms.  . Migraine headache    Family History  Problem Relation Age of Onset  . Breast cancer  Maternal Grandmother   . Colon cancer Maternal Grandmother   . Atrial fibrillation Mother 49       Status post pacemaker  . Hypertension Mother   . Stroke Mother   . Hypercholesterolemia Mother   . COPD Mother   . Hypothyroidism Mother   . CAD Mother        Diagnosed late  . Atrial fibrillation Maternal Grandfather        Dilated age 108  . Atrial fibrillation Maternal Uncle   . Hodgkin's lymphoma Father        Died @ 45  . Lung cancer Father   . Pancreatic cancer Sister   . Hypertension Sister   . Hyperlipidemia Sister    Past Surgical History:  Procedure Laterality Date  . CHOLECYSTECTOMY  1989  . TEMPOROMANDIBULAR JOINT ARTHROPLASTY    . Thyroid nodule resection  2005  . TOTAL ABDOMINAL HYSTERECTOMY W/ BILATERAL SALPINGOOPHORECTOMY  1982   Social History   Occupational History  . Not on file  Tobacco Use  . Smoking status: Never Smoker  . Smokeless tobacco: Never Used  Substance and Sexual Activity  . Alcohol use: Never  . Drug use: Never  . Sexual activity: Not on file

## 2019-07-25 DIAGNOSIS — M19049 Primary osteoarthritis, unspecified hand: Secondary | ICD-10-CM | POA: Diagnosis not present

## 2019-07-25 DIAGNOSIS — E782 Mixed hyperlipidemia: Secondary | ICD-10-CM | POA: Diagnosis not present

## 2019-07-25 DIAGNOSIS — E7800 Pure hypercholesterolemia, unspecified: Secondary | ICD-10-CM | POA: Diagnosis not present

## 2019-07-25 DIAGNOSIS — M47897 Other spondylosis, lumbosacral region: Secondary | ICD-10-CM | POA: Diagnosis not present

## 2019-07-25 DIAGNOSIS — E039 Hypothyroidism, unspecified: Secondary | ICD-10-CM | POA: Diagnosis not present

## 2019-07-25 DIAGNOSIS — M47816 Spondylosis without myelopathy or radiculopathy, lumbar region: Secondary | ICD-10-CM | POA: Diagnosis not present

## 2019-08-02 ENCOUNTER — Encounter: Payer: Self-pay | Admitting: Specialist

## 2019-08-05 ENCOUNTER — Encounter: Payer: Self-pay | Admitting: Specialist

## 2019-08-09 ENCOUNTER — Encounter: Payer: Self-pay | Admitting: Physical Medicine and Rehabilitation

## 2019-08-15 DIAGNOSIS — M48062 Spinal stenosis, lumbar region with neurogenic claudication: Secondary | ICD-10-CM | POA: Diagnosis not present

## 2019-08-15 DIAGNOSIS — G894 Chronic pain syndrome: Secondary | ICD-10-CM | POA: Diagnosis not present

## 2019-08-15 DIAGNOSIS — G8929 Other chronic pain: Secondary | ICD-10-CM | POA: Diagnosis not present

## 2019-08-17 ENCOUNTER — Ambulatory Visit: Payer: PPO | Admitting: Specialist

## 2019-08-23 ENCOUNTER — Encounter: Payer: Self-pay | Admitting: Physical Medicine and Rehabilitation

## 2019-08-23 DIAGNOSIS — E782 Mixed hyperlipidemia: Secondary | ICD-10-CM | POA: Diagnosis not present

## 2019-08-23 DIAGNOSIS — M19049 Primary osteoarthritis, unspecified hand: Secondary | ICD-10-CM | POA: Diagnosis not present

## 2019-08-23 DIAGNOSIS — E039 Hypothyroidism, unspecified: Secondary | ICD-10-CM | POA: Diagnosis not present

## 2019-08-23 DIAGNOSIS — M47816 Spondylosis without myelopathy or radiculopathy, lumbar region: Secondary | ICD-10-CM | POA: Diagnosis not present

## 2019-08-23 DIAGNOSIS — M47897 Other spondylosis, lumbosacral region: Secondary | ICD-10-CM | POA: Diagnosis not present

## 2019-08-23 DIAGNOSIS — E7800 Pure hypercholesterolemia, unspecified: Secondary | ICD-10-CM | POA: Diagnosis not present

## 2019-08-24 ENCOUNTER — Encounter: Payer: Self-pay | Admitting: Physical Medicine and Rehabilitation

## 2019-08-29 ENCOUNTER — Other Ambulatory Visit: Payer: Self-pay | Admitting: Physical Medicine and Rehabilitation

## 2019-08-29 DIAGNOSIS — F411 Generalized anxiety disorder: Secondary | ICD-10-CM

## 2019-08-29 MED ORDER — DIAZEPAM 2 MG PO TABS: ORAL_TABLET | ORAL | 0 refills | Status: DC

## 2019-08-29 NOTE — Progress Notes (Signed)
Pre-procedure diazepam ordered for pre-operative anxiety.  

## 2019-09-08 DIAGNOSIS — E039 Hypothyroidism, unspecified: Secondary | ICD-10-CM | POA: Diagnosis not present

## 2019-09-15 DIAGNOSIS — Z6824 Body mass index (BMI) 24.0-24.9, adult: Secondary | ICD-10-CM | POA: Diagnosis not present

## 2019-09-15 DIAGNOSIS — E039 Hypothyroidism, unspecified: Secondary | ICD-10-CM | POA: Diagnosis not present

## 2019-09-15 DIAGNOSIS — Z8639 Personal history of other endocrine, nutritional and metabolic disease: Secondary | ICD-10-CM | POA: Diagnosis not present

## 2019-09-15 DIAGNOSIS — G809 Cerebral palsy, unspecified: Secondary | ICD-10-CM | POA: Diagnosis not present

## 2019-09-15 DIAGNOSIS — Z9889 Other specified postprocedural states: Secondary | ICD-10-CM | POA: Diagnosis not present

## 2019-09-21 ENCOUNTER — Ambulatory Visit: Payer: PPO | Admitting: Physical Medicine and Rehabilitation

## 2019-09-21 ENCOUNTER — Ambulatory Visit: Payer: PPO

## 2019-09-21 ENCOUNTER — Encounter: Payer: Self-pay | Admitting: Physical Medicine and Rehabilitation

## 2019-09-21 ENCOUNTER — Other Ambulatory Visit: Payer: Self-pay

## 2019-09-21 VITALS — BP 94/58 | HR 54

## 2019-09-21 DIAGNOSIS — G894 Chronic pain syndrome: Secondary | ICD-10-CM

## 2019-09-21 DIAGNOSIS — M48062 Spinal stenosis, lumbar region with neurogenic claudication: Secondary | ICD-10-CM

## 2019-09-21 DIAGNOSIS — M5416 Radiculopathy, lumbar region: Secondary | ICD-10-CM

## 2019-09-21 MED ORDER — CEPHALEXIN 500 MG PO CAPS: 500.0000 mg | ORAL_CAPSULE | Freq: Two times a day (BID) | ORAL | 0 refills | Status: DC

## 2019-09-21 NOTE — Progress Notes (Signed)
Pt states right lower back pain. Pt states walking, blending and standing in one place. Pt states sitting help with the pain. Pt hx of epidural inj on 06/14/19 didn't help with the pain.  Numeric Pain Rating Scale and Functional Assessment Average Pain 9   In the last MONTH (on 0-10 scale) has pain interfered with the following?  1. General activity like being  able to carry out your everyday physical activities such as walking, climbing stairs, carrying groceries, or moving a chair?  Rating(10)   +Driver, -BT, -Dye Allergies.

## 2019-09-21 NOTE — Procedures (Signed)
Advanced programming analysis performed with the [employer] technical representative with physician input and  monitoring. [employer] FAST and contour programming with  combined therapy initiated. 

## 2019-09-21 NOTE — Progress Notes (Signed)
Holly Mcconnell - 74 y.o. female MRN [id number]  Date of birth: 04-05-1945  Office Visit Note: Visit Date: 09/21/2019 PCP: Cari Caraway, MD Referred by: Cari Caraway, MD  Subjective: Chief Complaint  Patient presents with  . Lower Back - Pain   HPI:  Holly Mcconnell is a 74 y.o. female who comes in today at the request of Dr. Laurence Spates for planned spinal cord stimulator trial. The patient has failed conservative care including home exercise, medications, time and activity modification as well as injections and surgery.  This represents a procedure of last resort for recalcitrant pain. Pre-procedure psychological evaluation has been completed and procedure pre-authorization has been obtained.  Please see requesting physician notes for further details and justification.  Referring:  Dr. Basil Dess   Review of Systems  Musculoskeletal: Positive for back pain and joint pain.  All other systems reviewed and are negative.  Otherwise per HPI.  Assessment & Plan: Visit Diagnoses:  1. Spinal stenosis of lumbar region with neurogenic claudication   2. Lumbar radiculopathy   3. Chronic pain syndrome     Plan: No additional findings.   Meds & Orders:  Meds ordered this encounter  Medications  . cephALEXin (KEFLEX) 500 MG capsule    Sig: Take 1 capsule (500 mg total) by mouth 2 (two) times daily.    Dispense:  14 capsule    Refill:  0    Orders Placed This Encounter  Procedures  . Spinal Cord Stimulator - Placement  . Spinal Cord Stimulator - Analysis  . XR C-ARM NO REPORT    Follow-up: Return in about 1 week (around 09/28/2019) for Stimulator trial lead pull.   Procedures: No procedures performed  Spinal Cord Stimulator Trial  Patient: Holly Mcconnell      Date of Birth: [birth year] MRN: [id number] PCP: Cari Caraway, MD      Visit Date: 09/21/2019   Universal Protocol:    Date/Time: 09/20/2108:34 AM  Consent Given By: the  patient  Position: PRONE  Additional Comments: Vital signs were monitored before and after the procedure. Patient was prepped and draped in the usual sterile fashion. The correct patient, procedure, and site was verified.   Injection Procedure Details:  Procedure Site One Meds Administered:  Meds ordered this encounter  Medications  . cephALEXin (KEFLEX) 500 MG capsule    Sig: Take 1 capsule (500 mg total) by mouth 2 (two) times daily.    Dispense:  14 capsule    Refill:  0     Location/Site: Leads were placed just to the left and right of midline with the uppermost lead at the bottom one third of the T6 vertebral body  and then spanning 3 vertebral bodies with the inferior lead at the middle of the T9 vertebral body.   Needle size: 14 gauge   Needle type: EPIMED RX Coud Epidural Needle  Needle Placement: T12-L1 Dorsal epidural space  Findings:  -  -Comments: Excellent paresthesia coverage was obtained in all of the areas of the patient's normal pain  Procedure Details: After localization of the T12-L1 epidural space and the overlying soft tissue, the needle was introduced two bodies below this level to produce an adequate angle for epidural penetration. The soft tissue was infiltrated with 2-3 mls. of 1% Lidocaine without Epinephrine. Using the posterolateral approach, the #14 gauge Epimed needle was inserted down to the posterior aspect of the L1 lamina and then "walked off" the superior aspect of this lamina into the  T12-L1 epidural space. The epidural space was localized with loss of resistance and negative aspirate for CSF or blood. The [employer] (16) electrode stimulation lead was passed up so that just the superior most lead was at the location noted above while maintaining the lead in the midline.   The above procedure was repeated for a second [place] (16) electrode stimulation lead for the opposite side. The pulse generator  system was adjusted and programmed by myself and the company technical representative by varying the stimulator sites, rates, pulse amplitude, and duration. Adequate coverage was obtained as described above.  Subsequent to this, the introducer needle was removed and the spinal cord stimulator trial lead was fastened to the skin using steri-strips and strain reduction loop. The lead wire was then connected and Tegaderm was applied to produce an occlusive dressing. The patient was then brought out of the procedure room and was given a portable stimulator.  The patient will follow-up in three to seven days to determine whether this was efficacious.  Oral prophylactic antibiotic, either Keflex or clindamycin, was prescribed as noted.   Additional Comments:  The patient tolerated the procedure well Dressing: As noted above    Post-procedure details: Patient was observed during the procedure. Post-procedure instructions were reviewed.  Patient left the clinic in stable condition.    Advanced programming analysis performed with the [facility] with physician input and  monitoring.  [employer] FAST and Consulting civil engineer with  combined therapy initiated.     Clinical History: MRI LUMBAR SPINE WITHOUT CONTRAST  TECHNIQUE: Multiplanar, multisequence MR imaging of the lumbar spine was performed. No intravenous contrast was administered.  COMPARISON: 01/19/2018  FINDINGS: Segmentation: Standard.  Alignment: Minimal retrolisthesis of L2 on L3.  Vertebrae: No fracture, evidence of discitis, or bone lesion.  Conus medullaris and cauda equina: Conus extends to the L1-2 level. Conus and cauda equina appear normal.  Paraspinal and other soft tissues: No acute paraspinal abnormality.  Disc levels:  Disc spaces: Degenerative disease with disc height loss at L4-5 and L5-S1 and to lesser extent L3-4.  T12-L1: No significant disc bulge. No evidence  of neural foraminal stenosis. No central canal stenosis.  L1-L2: No significant disc bulge. No evidence of neural foraminal stenosis. No central canal stenosis.  L2-L3: Mild broad-based disc bulge. Mild bilateral facet arthropathy. No evidence of neural foraminal stenosis. No central canal stenosis.  L3-L4: Mild broad-based disc bulge. Mild bilateral facet arthropathy. Bilateral lateral recess narrowing. No evidence of neural foraminal stenosis. Mild spinal stenosis.  L4-L5: Broad-based disc bulge flattening the ventral thecal sac. Moderate bilateral facet arthropathy. Bilateral lateral recess narrowing. Moderate spinal stenosis. Mild right foraminal stenosis. No left foraminal stenosis.  L5-S1: Broad-based disc bulge with a small central disc protrusion. Moderate bilateral facet arthropathy. No central canal stenosis.  IMPRESSION: 1. Lumbar spine spondylosis as described above. No significant interval change compared with the prior exam. 2. At L4-5 there is a broad-based disc bulge flattening the ventral thecal sac. Moderate bilateral facet arthropathy. Bilateral lateral recess narrowing. Moderate spinal stenosis. Mild right foraminal stenosis.   Electronically Signed  By: Kathreen Devoid  On: 11/24/2018 12:23     Objective:  VS:  HT:    WT:   BMI:     BP:(!) 94/58  HR:(!) 54bpm  TEMP: ( )  RESP:  Physical Exam Constitutional:      General: She is not in acute distress.    Appearance: Normal appearance. She  is not ill-appearing.  HENT:     Head: Normocephalic and atraumatic.     Right Ear: External ear normal.     Left Ear: External ear normal.  Eyes:     Extraocular Movements: Extraocular movements intact.  Cardiovascular:     Rate and Rhythm: Normal rate.     Pulses: Normal pulses.  Musculoskeletal:     Right lower leg: No edema.     Left lower leg: No edema.     Comments: Patient has good distal strength with no pain over the greater trochanters.  No clonus  or focal weakness.  She does ambulate with a walker with a somewhat spastic gait from cerebral palsy.  Skin:    Findings: No erythema, lesion or rash.  Neurological:     General: No focal deficit present.     Mental Status: She is alert and oriented to person, place, and time.     Sensory: No sensory deficit.     Motor: No weakness or abnormal muscle tone.     Coordination: Coordination normal.     Gait: Gait abnormal.  Psychiatric:        Mood and Affect: Mood normal.        Behavior: Behavior normal.      Imaging: XR C-ARM NO REPORT  Result Date: 09/21/2019 Please see Notes tab for imaging impression.

## 2019-09-21 NOTE — Procedures (Signed)
Spinal Cord Stimulator Trial  Patient: Holly Mcconnell      Date of Birth: 1945/09/08 MRN: [id number] PCP: Cari Caraway, MD      Visit Date: 09/21/2019   Universal Protocol:    Date/Time: 09/20/2108:34 AM  Consent Given By: the patient  Position: PRONE  Additional Comments: Vital signs were monitored before and after the procedure. Patient was prepped and draped in the usual sterile fashion. The correct patient, procedure, and site was verified.   Injection Procedure Details:  Procedure Site One Meds Administered:  Meds ordered this encounter  Medications  . cephALEXin (KEFLEX) 500 MG capsule    Sig: Take 1 capsule (500 mg total) by mouth 2 (two) times daily.    Dispense:  14 capsule    Refill:  0     Location/Site: Leads were placed just to the left and right of midline with the uppermost lead at the bottom one third of the T6 vertebral body  and then spanning 3 vertebral bodies with the inferior lead at the middle of the T9 vertebral body.   Needle size: 14 gauge   Needle type: EPIMED RX Coud Epidural Needle  Needle Placement: T12-L1 Dorsal epidural space  Findings:  -  -Comments: Excellent paresthesia coverage was obtained in all of the areas of the patient's normal pain  Procedure Details: After localization of the T12-L1 epidural space and the overlying soft tissue, the needle was introduced two bodies below this level to produce an adequate angle for epidural penetration. The soft tissue was infiltrated with 2-3 mls. of 1% Lidocaine without Epinephrine. Using the posterolateral approach, the #14 gauge Epimed needle was inserted down to the posterior aspect of the L1 lamina and then "walked off" the superior aspect of this lamina into the T12-L1 epidural space. The epidural space was localized with loss of resistance and negative aspirate for CSF or blood. The [employer] (16) electrode stimulation lead was passed up so that just the superior  most lead was at the location noted above while maintaining the lead in the midline.   The above procedure was repeated for a second [place] (16) electrode stimulation lead for the opposite side. The pulse generator system was adjusted and programmed by myself and the company technical representative by varying the stimulator sites, rates, pulse amplitude, and duration. Adequate coverage was obtained as described above.  Subsequent to this, the introducer needle was removed and the spinal cord stimulator trial lead was fastened to the skin using steri-strips and strain reduction loop. The lead wire was then connected and Tegaderm was applied to produce an occlusive dressing. The patient was then brought out of the procedure room and was given a portable stimulator.  The patient will follow-up in three to seven days to determine whether this was efficacious.  Oral prophylactic antibiotic, either Keflex or clindamycin, was prescribed as noted.   Additional Comments:  The patient tolerated the procedure well Dressing: As noted above    Post-procedure details: Patient was observed during the procedure. Post-procedure instructions were reviewed.  Patient left the clinic in stable condition.

## 2019-09-26 ENCOUNTER — Other Ambulatory Visit: Payer: Self-pay

## 2019-09-26 ENCOUNTER — Ambulatory Visit (INDEPENDENT_AMBULATORY_CARE_PROVIDER_SITE_OTHER): Payer: PPO | Admitting: Physical Medicine and Rehabilitation

## 2019-09-26 DIAGNOSIS — G894 Chronic pain syndrome: Secondary | ICD-10-CM

## 2019-09-26 DIAGNOSIS — M48062 Spinal stenosis, lumbar region with neurogenic claudication: Secondary | ICD-10-CM

## 2019-09-26 DIAGNOSIS — M5416 Radiculopathy, lumbar region: Secondary | ICD-10-CM

## 2019-09-26 NOTE — Progress Notes (Signed)
Patient reports good relief from SCS trial.

## 2019-09-27 ENCOUNTER — Encounter: Payer: Self-pay | Admitting: Physical Medicine and Rehabilitation

## 2019-09-27 NOTE — Progress Notes (Signed)
Holly Mcconnell - 74 y.o. female MRN [id number]  Date of birth: 03-15-1945  Office Visit Note: Visit Date: 09/26/2019 PCP: Cari Caraway, MD Referred by: Cari Caraway, MD  Subjective: Chief Complaint  Patient presents with  . Lower Back - Pain   HPI: Holly Mcconnell is a 74 y.o. female who comes in today For spinal cord stimulator lead pull.  Patient wanted to end the trial a little bit early do to irritation of the tape around the bandage for the external leads.  She reports for the first 3 days excellent pain relief and very happy and thrilled with the results of the stimulator.  She reports her pain probably went from a 10 to a 2 at that point.  There seem to have been some lead migration at that point and she was reprogrammed by Tillie Rung from [employer] and was confirmed electronically that there probably was some movement of the lead.  Nonetheless she does come in today and we did remove the leads without difficulty and there was some redness around the tape used to bolster the bandage.  The tape was applied by her husband and it was a plastic type adhesive.  She does want to proceed with the next step of implant.  She is very excited about the relief that she obtained.  Please see our prior notes for further details and justification.  We will make referral to Dr. Blair Hailey at [facility].  Review of Systems  Musculoskeletal: Positive for back pain and joint pain.  All other systems reviewed and are negative.  Otherwise per HPI.  Assessment & Plan: Visit Diagnoses:  1. Spinal stenosis of lumbar region with neurogenic claudication   2. Lumbar radiculopathy   3. Chronic pain syndrome     Plan: No additional findings.   Meds & Orders: No orders of the defined types were placed in this encounter.   Orders Placed This Encounter  Procedures  . Ambulatory referral to Anesthesiology    Follow-up: Return if symptoms worsen or  fail to improve.   Procedures:  Quick procedure note: Patient was placed prone on the exam table. The external stimulator/generator was unfastened from the leads. Some redness around the edges of the tape that was used to bolster the bandage.  There was a small skin tear or abrasion around the edge of the tape in the upper left part of that.  Injection sites were without redness or induration or drainage.The outer Tegaderm was removed from the bandage carefully. There was no noted erythema of the skin or swelling or induration or drainage. There had been some bloody discharge in the gauze pad. Both trial leads were pulled without difficulty and without discomfort. There was no drainage from the insertion site. Band-Aid was applied.  Clinical History: MRI LUMBAR SPINE WITHOUT CONTRAST  TECHNIQUE: Multiplanar, multisequence MR imaging of the lumbar spine was performed. No intravenous contrast was administered.  COMPARISON: 01/19/2018  FINDINGS: Segmentation: Standard.  Alignment: Minimal retrolisthesis of L2 on L3.  Vertebrae: No fracture, evidence of discitis, or bone lesion.  Conus medullaris and cauda equina: Conus extends to the L1-2 level. Conus and cauda equina appear normal.  Paraspinal and other soft tissues: No acute paraspinal abnormality.  Disc levels:  Disc spaces: Degenerative disease with disc height loss at L4-5 and L5-S1 and to lesser extent L3-4.  T12-L1: No significant disc bulge. No evidence of neural foraminal stenosis. No central canal stenosis.  L1-L2: No significant disc  bulge. No evidence of neural foraminal stenosis. No central canal stenosis.  L2-L3: Mild broad-based disc bulge. Mild bilateral facet arthropathy. No evidence of neural foraminal stenosis. No central canal stenosis.  L3-L4: Mild broad-based disc bulge. Mild bilateral facet arthropathy. Bilateral lateral recess narrowing. No evidence of neural foraminal stenosis. Mild spinal  stenosis.  L4-L5: Broad-based disc bulge flattening the ventral thecal sac. Moderate bilateral facet arthropathy. Bilateral lateral recess narrowing. Moderate spinal stenosis. Mild right foraminal stenosis. No left foraminal stenosis.  L5-S1: Broad-based disc bulge with a small central disc protrusion. Moderate bilateral facet arthropathy. No central canal stenosis.  IMPRESSION: 1. Lumbar spine spondylosis as described above. No significant interval change compared with the prior exam. 2. At L4-5 there is a broad-based disc bulge flattening the ventral thecal sac. Moderate bilateral facet arthropathy. Bilateral lateral recess narrowing. Moderate spinal stenosis. Mild right foraminal stenosis.   Electronically Signed  By: Kathreen Devoid  On: 11/24/2018 12:23   She reports that she has never smoked. She has never used smokeless tobacco. No results for input(s): HGBA1C, LABURIC in the last 8760 hours.  Objective:  VS:  HT:    WT:   BMI:     BP:   HR: bpm  TEMP: ( )  RESP:  Physical Exam Vitals and nursing note reviewed.  Constitutional:      General: She is not in acute distress.    Appearance: Normal appearance. She is well-developed. She is not ill-appearing.  HENT:     Head: Normocephalic and atraumatic.  Eyes:     Conjunctiva/sclera: Conjunctivae normal.     Pupils: Pupils are equal, round, and reactive to light.  Cardiovascular:     Rate and Rhythm: Normal rate.     Pulses: Normal pulses.  Pulmonary:     Effort: Pulmonary effort is normal.  Musculoskeletal:     Right lower leg: No edema.     Left lower leg: No edema.  Skin:    General: Skin is warm and dry.     Findings: No erythema or rash.     Comments: Some redness around the edges of the tape that was used to bolster the bandage.  There was a small skin tear or abrasion around the edge of the tape in the upper left part of that.  Injection sites were without redness or induration or drainage.  Neurological:      General: No focal deficit present.     Mental Status: She is alert and oriented to person, place, and time.     Sensory: No sensory deficit.     Motor: No abnormal muscle tone.     Coordination: Coordination normal.     Gait: Gait normal.  Psychiatric:        Mood and Affect: Mood normal.        Behavior: Behavior normal.     Ortho Exam  Imaging: No results found.  Past Medical/Family/Surgical/Social History: Medications & Allergies reviewed per EMR, new medications updated. Patient Active Problem List   Diagnosis Date Noted  . Paroxysmal tachycardia, unspecified ([place]) 03/17/2018  . Cerebral palsy ([place]) 03/01/2018  . Lumbar radiculopathy 03/01/2018  . 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 Radiculopathy due to lumbar intervertebral disc disorder 03/01/2018  . Migraine headache    Past Medical History:  Diagnosis Date  . Cerebral palsy ([facility])    Mild symptoms.  . Migraine headache    Family History  Problem Relation Age of Onset  . Breast  cancer Maternal Grandmother   . Colon cancer Maternal Grandmother   . Atrial fibrillation Mother 65       Status post pacemaker  . Hypertension Mother   . Stroke Mother   . Hypercholesterolemia Mother   . COPD Mother   . Hypothyroidism Mother   . CAD Mother        Diagnosed late  . Atrial fibrillation Maternal Grandfather        Dilated age 79  . Atrial fibrillation Maternal Uncle   . Hodgkin's lymphoma Father        Died @ 72  . Lung cancer Father   . Pancreatic cancer Sister   . Hypertension Sister   . Hyperlipidemia Sister    Past Surgical History:  Procedure Laterality Date  . CHOLECYSTECTOMY  1989  . TEMPOROMANDIBULAR JOINT ARTHROPLASTY    . Thyroid nodule resection  2005  . TOTAL ABDOMINAL HYSTERECTOMY W/ BILATERAL SALPINGOOPHORECTOMY  1982   Social History   Occupational History  . Not on file  Tobacco Use  . Smoking status: Never Smoker  . Smokeless tobacco: Never Used   Substance and Sexual Activity  . Alcohol use: Never  . Drug use: Never  . Sexual activity: Not on file

## 2019-09-28 ENCOUNTER — Ambulatory Visit: Payer: PPO | Admitting: Physical Medicine and Rehabilitation

## 2019-09-28 DIAGNOSIS — M19049 Primary osteoarthritis, unspecified hand: Secondary | ICD-10-CM | POA: Diagnosis not present

## 2019-09-28 DIAGNOSIS — M8588 Other specified disorders of bone density and structure, other site: Secondary | ICD-10-CM | POA: Diagnosis not present

## 2019-09-28 DIAGNOSIS — G809 Cerebral palsy, unspecified: Secondary | ICD-10-CM | POA: Diagnosis not present

## 2019-09-28 DIAGNOSIS — M47816 Spondylosis without myelopathy or radiculopathy, lumbar region: Secondary | ICD-10-CM | POA: Diagnosis not present

## 2019-09-28 DIAGNOSIS — Z1389 Encounter for screening for other disorder: Secondary | ICD-10-CM | POA: Diagnosis not present

## 2019-09-28 DIAGNOSIS — E039 Hypothyroidism, unspecified: Secondary | ICD-10-CM | POA: Diagnosis not present

## 2019-09-28 DIAGNOSIS — E673 Hypervitaminosis D: Secondary | ICD-10-CM | POA: Diagnosis not present

## 2019-09-28 DIAGNOSIS — E782 Mixed hyperlipidemia: Secondary | ICD-10-CM | POA: Diagnosis not present

## 2019-09-28 DIAGNOSIS — Z0000 Encounter for general adult medical examination without abnormal findings: Secondary | ICD-10-CM | POA: Diagnosis not present

## 2019-10-06 DIAGNOSIS — M47897 Other spondylosis, lumbosacral region: Secondary | ICD-10-CM | POA: Diagnosis not present

## 2019-10-06 DIAGNOSIS — M8588 Other specified disorders of bone density and structure, other site: Secondary | ICD-10-CM | POA: Diagnosis not present

## 2019-10-06 DIAGNOSIS — E039 Hypothyroidism, unspecified: Secondary | ICD-10-CM | POA: Diagnosis not present

## 2019-10-06 DIAGNOSIS — M19049 Primary osteoarthritis, unspecified hand: Secondary | ICD-10-CM | POA: Diagnosis not present

## 2019-10-06 DIAGNOSIS — R002 Palpitations: Secondary | ICD-10-CM | POA: Diagnosis not present

## 2019-10-06 DIAGNOSIS — E782 Mixed hyperlipidemia: Secondary | ICD-10-CM | POA: Diagnosis not present

## 2019-10-06 DIAGNOSIS — R197 Diarrhea, unspecified: Secondary | ICD-10-CM | POA: Diagnosis not present

## 2019-10-06 DIAGNOSIS — G43909 Migraine, unspecified, not intractable, without status migrainosus: Secondary | ICD-10-CM | POA: Diagnosis not present

## 2019-10-06 DIAGNOSIS — M47816 Spondylosis without myelopathy or radiculopathy, lumbar region: Secondary | ICD-10-CM | POA: Diagnosis not present

## 2019-10-06 DIAGNOSIS — L600 Ingrowing nail: Secondary | ICD-10-CM | POA: Diagnosis not present

## 2019-11-01 DIAGNOSIS — L821 Other seborrheic keratosis: Secondary | ICD-10-CM | POA: Diagnosis not present

## 2019-11-01 DIAGNOSIS — D2271 Melanocytic nevi of right lower limb, including hip: Secondary | ICD-10-CM | POA: Diagnosis not present

## 2019-11-01 DIAGNOSIS — D2262 Melanocytic nevi of left upper limb, including shoulder: Secondary | ICD-10-CM | POA: Diagnosis not present

## 2019-11-01 DIAGNOSIS — D1801 Hemangioma of skin and subcutaneous tissue: Secondary | ICD-10-CM | POA: Diagnosis not present

## 2019-11-01 DIAGNOSIS — L718 Other rosacea: Secondary | ICD-10-CM | POA: Diagnosis not present

## 2019-11-01 DIAGNOSIS — D225 Melanocytic nevi of trunk: Secondary | ICD-10-CM | POA: Diagnosis not present

## 2019-11-01 DIAGNOSIS — D2272 Melanocytic nevi of left lower limb, including hip: Secondary | ICD-10-CM | POA: Diagnosis not present

## 2019-11-02 DIAGNOSIS — M5416 Radiculopathy, lumbar region: Secondary | ICD-10-CM | POA: Diagnosis not present

## 2019-11-02 DIAGNOSIS — G894 Chronic pain syndrome: Secondary | ICD-10-CM | POA: Diagnosis not present

## 2019-11-04 DIAGNOSIS — M5416 Radiculopathy, lumbar region: Secondary | ICD-10-CM | POA: Diagnosis not present

## 2019-11-04 DIAGNOSIS — G894 Chronic pain syndrome: Secondary | ICD-10-CM | POA: Diagnosis not present

## 2019-11-09 DIAGNOSIS — Z9689 Presence of other specified functional implants: Secondary | ICD-10-CM | POA: Diagnosis not present

## 2019-11-15 DIAGNOSIS — Z9689 Presence of other specified functional implants: Secondary | ICD-10-CM | POA: Diagnosis not present

## 2019-11-15 DIAGNOSIS — Z6826 Body mass index (BMI) 26.0-26.9, adult: Secondary | ICD-10-CM | POA: Diagnosis not present

## 2019-11-29 ENCOUNTER — Other Ambulatory Visit: Payer: Self-pay | Admitting: Cardiology

## 2019-11-29 DIAGNOSIS — M47897 Other spondylosis, lumbosacral region: Secondary | ICD-10-CM | POA: Diagnosis not present

## 2019-11-29 DIAGNOSIS — E039 Hypothyroidism, unspecified: Secondary | ICD-10-CM | POA: Diagnosis not present

## 2019-11-29 DIAGNOSIS — M19049 Primary osteoarthritis, unspecified hand: Secondary | ICD-10-CM | POA: Diagnosis not present

## 2019-11-29 DIAGNOSIS — E782 Mixed hyperlipidemia: Secondary | ICD-10-CM | POA: Diagnosis not present

## 2019-11-29 DIAGNOSIS — E7800 Pure hypercholesterolemia, unspecified: Secondary | ICD-10-CM | POA: Diagnosis not present

## 2019-11-29 DIAGNOSIS — M47816 Spondylosis without myelopathy or radiculopathy, lumbar region: Secondary | ICD-10-CM | POA: Diagnosis not present

## 2019-11-29 NOTE — Telephone Encounter (Signed)
*  STAT* If patient is at the pharmacy, call can be transferred to refill team.   1. Which medications need to be refilled? (please list name of each medication and dose if known) nadolol (CORGARD) 20 MG tablet  2. Which pharmacy/location (including street and city if local pharmacy) is medication to be sent to? [facility]  3. Do they need a 30 day or 90 day supply? 90 day   Patient is out of medication

## 2019-11-30 MED ORDER — NADOLOL 20 MG PO TABS: 20.0000 mg | ORAL_TABLET | Freq: Every day | ORAL | 0 refills | Status: DC

## 2019-12-07 DIAGNOSIS — E039 Hypothyroidism, unspecified: Secondary | ICD-10-CM | POA: Diagnosis not present

## 2019-12-14 DIAGNOSIS — M5416 Radiculopathy, lumbar region: Secondary | ICD-10-CM | POA: Diagnosis not present

## 2019-12-14 DIAGNOSIS — Z9689 Presence of other specified functional implants: Secondary | ICD-10-CM | POA: Diagnosis not present

## 2019-12-15 DIAGNOSIS — Z8639 Personal history of other endocrine, nutritional and metabolic disease: Secondary | ICD-10-CM | POA: Diagnosis not present

## 2019-12-15 DIAGNOSIS — E039 Hypothyroidism, unspecified: Secondary | ICD-10-CM | POA: Diagnosis not present

## 2019-12-15 DIAGNOSIS — Z6824 Body mass index (BMI) 24.0-24.9, adult: Secondary | ICD-10-CM | POA: Diagnosis not present

## 2019-12-15 DIAGNOSIS — Z23 Encounter for immunization: Secondary | ICD-10-CM | POA: Diagnosis not present

## 2019-12-15 DIAGNOSIS — G809 Cerebral palsy, unspecified: Secondary | ICD-10-CM | POA: Diagnosis not present

## 2019-12-15 DIAGNOSIS — Z9889 Other specified postprocedural states: Secondary | ICD-10-CM | POA: Diagnosis not present

## 2020-01-06 DIAGNOSIS — E782 Mixed hyperlipidemia: Secondary | ICD-10-CM | POA: Diagnosis not present

## 2020-01-06 DIAGNOSIS — M19049 Primary osteoarthritis, unspecified hand: Secondary | ICD-10-CM | POA: Diagnosis not present

## 2020-01-06 DIAGNOSIS — E039 Hypothyroidism, unspecified: Secondary | ICD-10-CM | POA: Diagnosis not present

## 2020-01-06 DIAGNOSIS — M47897 Other spondylosis, lumbosacral region: Secondary | ICD-10-CM | POA: Diagnosis not present

## 2020-01-06 DIAGNOSIS — M47816 Spondylosis without myelopathy or radiculopathy, lumbar region: Secondary | ICD-10-CM | POA: Diagnosis not present

## 2020-01-06 DIAGNOSIS — E7800 Pure hypercholesterolemia, unspecified: Secondary | ICD-10-CM | POA: Diagnosis not present

## 2020-01-10 ENCOUNTER — Encounter: Payer: Self-pay | Admitting: Cardiology

## 2020-01-10 ENCOUNTER — Other Ambulatory Visit: Payer: Self-pay

## 2020-01-10 ENCOUNTER — Ambulatory Visit: Payer: PPO | Admitting: Cardiology

## 2020-01-10 DIAGNOSIS — I479 Paroxysmal tachycardia, unspecified: Secondary | ICD-10-CM

## 2020-01-10 MED ORDER — NADOLOL 20 MG PO TABS: 20.0000 mg | ORAL_TABLET | Freq: Every day | ORAL | 3 refills | Status: DC

## 2020-01-10 NOTE — Progress Notes (Signed)
Primary Care Provider: Cari Caraway, MD Cardiologist: Glenetta Hew, MD Electrophysiologist: None  Clinic Note: Chief Complaint  Patient presents with  . Follow-up    Doing well  . Tachycardia    Short spells, not overly symptomatic   HPI:    Holly Mcconnell is a 74 y.o. female with a PMH notable for tachy-palpitations/short bursts of PAT along with HTN and hypothyroidism who presents today for annual follow-up  Problem List Items Addressed This Visit    Paroxysmal tachycardia, unspecified (HCC) (Chronic)    Episodes seem to be relatively few and far between and short-lived.  Doing well on current dose of nadolol.  We will refill her nadolol dose but not change.  Avoid triggers such as caffeine, sugar, sweets, chocolate, dehydration etc.  Discussed vagal maneuvers.      Relevant Medications   nadolol (CORGARD) 20 MG tablet     [place] was last seen on 01/10/2019.  She is doing relatively well.  Palpitations notably improved.  Just over 10 episodes lasting less than a minute since the previous visit.  She was less concerned about them because a were relatively benign.  They reduced dramatically since being on nadolol.  Also noted some intermittent chest discomfort off and on not at all associate with exercise.  Describes a sharp pinch. -> With pending possible back surgery  Recent Hospitalizations: None  Reviewed  CV studies:    The following studies were reviewed today: (if available, images/films reviewed: From Epic Chart or Care Everywhere) . None:   Interval History:   Holly Mcconnell is here today for routine follow-up.  She said she had outpatient back surgery on September 3 -> spinal stimulator placed.  She still feels somewhat weak, but the back pain has improved.  She really has not had any true palpitations in the last several months.  About a month ago she had one time when she felt her heart rate going fast and measured it at  120 bpm.  She did not really have any symptoms just felt her heart rate going fast which was a little unusual.  This lasted about a minute.  She denies any chest pain with rest or exertion, although she is not very active right now because of her back surgery.  No dyspnea on exertion or heart failure symptoms of PND, orthopnea or edema.  She is try to get back into the gym, since she has been cleared by surgery she has a hard time walking, but tries to do other training exercise machines and does at least a half an hour at a time.  CV Review of Symptoms (Summary): no chest pain or dyspnea on exertion positive for - 1 episode noted on watch ~1-2 min with HR 120 - No Sx.  Otw no Sx since last visit negative for - chest pain, dyspnea on exertion, edema, irregular heartbeat, orthopnea, palpitations, paroxysmal nocturnal dyspnea, shortness of breath or syncope/ near syncope; TIA/amaurosis fugax.  The patient does not have symptoms concerning for COVID-19 infection (fever, chills, cough, or new shortness of breath).   REVIEWED OF SYSTEMS   Review of Systems  Constitutional: Negative for malaise/fatigue and weight loss.  HENT: Negative for congestion and nosebleeds.   Respiratory: Negative for shortness of breath.   Gastrointestinal: Negative for blood in stool and melena.  Musculoskeletal: Positive for back pain (Just had spinal stimulator placed.  Not yet ready to do full activity, unable to twist in splint, but able to walk.  Still uses walker.). Negative for falls.  Neurological: Negative for dizziness, tingling and weakness (Legs are still baseline weak secondary to back/spinal stenosis).  Psychiatric/Behavioral: Negative for memory loss. The patient is not nervous/anxious and does not have insomnia.    I have reviewed and (if needed) personally updated the patient's problem list, medications, allergies, past medical and surgical history, social and family history.   PAST MEDICAL HISTORY    Past Medical History:  Diagnosis Date  . Cerebral palsy (HCC)    Mild symptoms.  . Migraine headache     PAST SURGICAL HISTORY   Past Surgical History:  Procedure Laterality Date  . CHOLECYSTECTOMY  1989  . TEMPOROMANDIBULAR JOINT ARTHROPLASTY    . Thyroid nodule resection  2005  . TOTAL ABDOMINAL HYSTERECTOMY W/ BILATERAL SALPINGOOPHORECTOMY  1982   Had [place] in Jan - stayed home, OP Infusion (husband in Dec - Phizer x 2 & had booster yesterday.  4/19, 5/5, 11/8 (feels a little weak & arm a bit sore only)   Immunization History  Administered Date(s) Administered  . PFIZER SARS-COV-2 Vaccination 06/20/2019, 07/07/2019, 01/09/2020    MEDICATIONS/ALLERGIES   Current Meds  Medication Sig  . aspirin 81 MG chewable tablet Chew by mouth daily.  . calcium-vitamin D (OSCAL WITH D) 500-200 MG-UNIT tablet Take 1 tablet by mouth.  Javier Docker Oil 500 MG CAPS Take by mouth.  . levothyroxine (SYNTHROID, LEVOTHROID) 75 MCG tablet TK 1 T PO QD IN THE MORNING OES  . MAGNESIUM PO Take by mouth daily.  . Multiple Vitamins-Minerals (MULTIVITAMIN ADULT PO) Take by mouth.  . nadolol (CORGARD) 20 MG tablet Take 1 tablet (20 mg total) by mouth at bedtime.  . SUMAtriptan (IMITREX) 100 MG tablet Take 100 mg by mouth every 2 (two) hours as needed for migraine. May repeat in 2 hours if headache persists or recurs.  . [DISCONTINUED] nadolol (CORGARD) 20 MG tablet Take 1 tablet (20 mg total) by mouth at bedtime. Must keep appt for future refills    Allergies  Allergen Reactions  . Amoxicillin Diarrhea  . Nabumetone Diarrhea and Nausea And Vomiting  . Sulfa Antibiotics Diarrhea and Nausea And Vomiting  . Codeine Diarrhea    SOCIAL HISTORY/FAMILY HISTORY   Reviewed in Epic:  Pertinent findings: N/A  OBJCTIVE -PE, EKG, labs   Wt Readings from Last 3 Encounters:  01/10/20 135 lb 9.6 oz (61.5 kg)  07/20/19 128 lb (58.1 kg)  07/04/19 125 lb (56.7 kg)    Physical Exam: BP 118/64   Pulse (!)  51   Ht 4\' 11"  (1.499 m)   Wt 135 lb 9.6 oz (61.5 kg)   SpO2 96%   BMI 27.39 kg/m  Physical Exam Vitals reviewed.  Constitutional:      General: She is not in acute distress.    Appearance: Normal appearance. She is normal weight. She is ill-appearing (A little chronic we ill-appearing with her significant scoliosis.). She is not diaphoretic.  HENT:     Head: Normocephalic and atraumatic.  Neck:     Vascular: No carotid bruit or JVD.  Cardiovascular:     Rate and Rhythm: Regular rhythm. Bradycardia present. Occasional extrasystoles are present.    Chest Wall: PMI is not displaced.     Pulses: Normal pulses and intact distal pulses.     Heart sounds: Normal heart sounds, S1 normal and S2 normal. No murmur heard.  No gallop. No S4 sounds.   Pulmonary:     Effort: Pulmonary effort is normal.  No respiratory distress.     Breath sounds: Normal breath sounds.  Chest:     Chest wall: No tenderness.  Musculoskeletal:        General: Deformity (Significant kyphoscoliosis) present. No swelling.     Cervical back: Normal range of motion and neck supple.  Skin:    General: Skin is warm and dry.  Neurological:     Mental Status: She is alert and oriented to person, place, and time.     Comments: Slow mildly slurred speech at baseline.  But intelligible.  Psychiatric:        Mood and Affect: Mood normal.        Behavior: Behavior normal.        Thought Content: Thought content normal.        Judgment: Judgment normal.     Adult ECG Report N/a   Recent Labs: 07/06/2019: TC 180, TG 63, HDL 91, LDL 77.   12/07/2019: Cr 0.76. No results found for: CHOL, HDL, LDLCALC, LDLDIRECT, TRIG, CHOLHDL No results found for: CREATININE, BUN, NA, K, CL, CO2 No results found for: TSH  ASSESSMENT/PLAN    Problem List Items Addressed This Visit    Paroxysmal tachycardia, unspecified (HCC) (Chronic)    Episodes seem to be relatively few and far between and short-lived.  Doing well on current dose  of nadolol.  We will refill her nadolol dose but not change.  Avoid triggers such as caffeine, sugar, sweets, chocolate, dehydration etc.  Discussed vagal maneuvers.      Relevant Medications   nadolol (CORGARD) 20 MG tablet       COVID-19 Education: The signs and symptoms of COVID-19 were discussed with the patient and how to seek care for testing (follow up with PCP or arrange E-visit).   The importance of social distancing and COVID-19 vaccination was discussed today. 1 min The patient is practicing social distancing & Masking.   I spent a total of 21 minutes with the patient spent in direct patient consultation.  Additional time spent with chart review  / charting (studies, outside notes, etc): 6 Total Time: 27 min   Current medicines are reviewed at length with the patient today.  (+/- concerns) needs medication refills  This visit occurred during the SARS-CoV-2 public health emergency.  Safety protocols were in place, including screening questions prior to the visit, additional usage of staff PPE, and extensive cleaning of exam room while observing appropriate contact time as indicated for disinfecting solutions.  Notice: This dictation was prepared with Dragon dictation along with smaller phrase technology. Any transcriptional errors that result from this process are unintentional and may not be corrected upon review.  Patient Instructions / Medication Changes & Studies & Tests Ordered   Patient Instructions  Medication Instructions:  No changes  *If you need a refill on your cardiac medications before your next appointment, please call your pharmacy*   Lab Work:  Not needed   Testing/Procedures:  Not needed  Follow-Up: At [facility], you and your health needs are our priority.  As part of our continuing mission to provide you with exceptional heart care, we have created designated Provider Care Teams.  These Care Teams include your primary Cardiologist  (physician) and Advanced Practice Providers (APPs -  Physician Assistants and Nurse Practitioners) who all work together to provide you with the care you need, when you need it.     Your next appointment:   12 month(s)  The format for your next appointment:  In Person  Provider:   Glenetta Hew, MD   Other Instructions    Studies Ordered:   No orders of the defined types were placed in this encounter.    Glenetta Hew, M.D., M.S. Interventional Cardiologist   Pager # [phone] Phone # [phone] [address] [place], [place] [address]   Thank you for choosing Heart[facility] at [facility]!!

## 2020-01-10 NOTE — Patient Instructions (Signed)

## 2020-01-21 ENCOUNTER — Encounter: Payer: Self-pay | Admitting: Cardiology

## 2020-01-21 NOTE — Assessment & Plan Note (Signed)
Episodes seem to be relatively few and far between and short-lived.  Doing well on current dose of nadolol.  We will refill her nadolol dose but not change.  Avoid triggers such as caffeine, sugar, sweets, chocolate, dehydration etc.  Discussed vagal maneuvers.

## 2020-02-09 DIAGNOSIS — M47816 Spondylosis without myelopathy or radiculopathy, lumbar region: Secondary | ICD-10-CM | POA: Diagnosis not present

## 2020-02-09 DIAGNOSIS — E7800 Pure hypercholesterolemia, unspecified: Secondary | ICD-10-CM | POA: Diagnosis not present

## 2020-02-09 DIAGNOSIS — M19049 Primary osteoarthritis, unspecified hand: Secondary | ICD-10-CM | POA: Diagnosis not present

## 2020-02-09 DIAGNOSIS — M47897 Other spondylosis, lumbosacral region: Secondary | ICD-10-CM | POA: Diagnosis not present

## 2020-02-09 DIAGNOSIS — E039 Hypothyroidism, unspecified: Secondary | ICD-10-CM | POA: Diagnosis not present

## 2020-02-09 DIAGNOSIS — E782 Mixed hyperlipidemia: Secondary | ICD-10-CM | POA: Diagnosis not present

## 2020-02-22 DIAGNOSIS — H18452 Nodular corneal degeneration, left eye: Secondary | ICD-10-CM | POA: Diagnosis not present

## 2020-02-22 DIAGNOSIS — H04123 Dry eye syndrome of bilateral lacrimal glands: Secondary | ICD-10-CM | POA: Diagnosis not present

## 2020-02-22 DIAGNOSIS — G43909 Migraine, unspecified, not intractable, without status migrainosus: Secondary | ICD-10-CM | POA: Diagnosis not present

## 2020-02-22 DIAGNOSIS — H524 Presbyopia: Secondary | ICD-10-CM | POA: Diagnosis not present

## 2020-02-22 DIAGNOSIS — H25813 Combined forms of age-related cataract, bilateral: Secondary | ICD-10-CM | POA: Diagnosis not present

## 2020-03-09 DIAGNOSIS — E7800 Pure hypercholesterolemia, unspecified: Secondary | ICD-10-CM | POA: Diagnosis not present

## 2020-03-09 DIAGNOSIS — E782 Mixed hyperlipidemia: Secondary | ICD-10-CM | POA: Diagnosis not present

## 2020-03-09 DIAGNOSIS — M47816 Spondylosis without myelopathy or radiculopathy, lumbar region: Secondary | ICD-10-CM | POA: Diagnosis not present

## 2020-03-09 DIAGNOSIS — E039 Hypothyroidism, unspecified: Secondary | ICD-10-CM | POA: Diagnosis not present

## 2020-03-09 DIAGNOSIS — M19049 Primary osteoarthritis, unspecified hand: Secondary | ICD-10-CM | POA: Diagnosis not present

## 2020-03-09 DIAGNOSIS — M47897 Other spondylosis, lumbosacral region: Secondary | ICD-10-CM | POA: Diagnosis not present

## 2020-04-10 ENCOUNTER — Ambulatory Visit: Payer: PPO | Admitting: Physical Medicine and Rehabilitation

## 2020-04-10 ENCOUNTER — Encounter: Payer: Self-pay | Admitting: Physical Medicine and Rehabilitation

## 2020-04-10 ENCOUNTER — Other Ambulatory Visit: Payer: Self-pay

## 2020-04-10 DIAGNOSIS — L600 Ingrowing nail: Secondary | ICD-10-CM | POA: Diagnosis not present

## 2020-04-10 DIAGNOSIS — Z9689 Presence of other specified functional implants: Secondary | ICD-10-CM | POA: Diagnosis not present

## 2020-04-10 DIAGNOSIS — M47816 Spondylosis without myelopathy or radiculopathy, lumbar region: Secondary | ICD-10-CM | POA: Diagnosis not present

## 2020-04-10 DIAGNOSIS — M5416 Radiculopathy, lumbar region: Secondary | ICD-10-CM

## 2020-04-10 DIAGNOSIS — M47897 Other spondylosis, lumbosacral region: Secondary | ICD-10-CM | POA: Diagnosis not present

## 2020-04-10 DIAGNOSIS — E039 Hypothyroidism, unspecified: Secondary | ICD-10-CM | POA: Diagnosis not present

## 2020-04-10 DIAGNOSIS — M19049 Primary osteoarthritis, unspecified hand: Secondary | ICD-10-CM | POA: Diagnosis not present

## 2020-04-10 DIAGNOSIS — M8588 Other specified disorders of bone density and structure, other site: Secondary | ICD-10-CM | POA: Diagnosis not present

## 2020-04-10 DIAGNOSIS — E782 Mixed hyperlipidemia: Secondary | ICD-10-CM | POA: Diagnosis not present

## 2020-04-10 DIAGNOSIS — G43909 Migraine, unspecified, not intractable, without status migrainosus: Secondary | ICD-10-CM | POA: Diagnosis not present

## 2020-04-10 DIAGNOSIS — G894 Chronic pain syndrome: Secondary | ICD-10-CM

## 2020-04-10 DIAGNOSIS — R197 Diarrhea, unspecified: Secondary | ICD-10-CM | POA: Diagnosis not present

## 2020-04-10 DIAGNOSIS — R002 Palpitations: Secondary | ICD-10-CM | POA: Diagnosis not present

## 2020-04-10 NOTE — Progress Notes (Signed)
Continued right sided low back and right leg pain.

## 2020-04-11 ENCOUNTER — Telehealth: Payer: Self-pay | Admitting: Physical Medicine and Rehabilitation

## 2020-04-11 NOTE — Telephone Encounter (Signed)
Abbe [place] pt husband called and needs court or shena to give him a call back. [place] told him to follow up with someone. CB [phone]

## 2020-04-11 NOTE — Telephone Encounter (Signed)
Returned call. Message in [place] sent to Dr. Ernestina Patches.

## 2020-04-12 DIAGNOSIS — Z1389 Encounter for screening for other disorder: Secondary | ICD-10-CM | POA: Diagnosis not present

## 2020-04-12 DIAGNOSIS — Z01419 Encounter for gynecological examination (general) (routine) without abnormal findings: Secondary | ICD-10-CM | POA: Diagnosis not present

## 2020-04-12 DIAGNOSIS — E039 Hypothyroidism, unspecified: Secondary | ICD-10-CM | POA: Diagnosis not present

## 2020-04-12 DIAGNOSIS — G809 Cerebral palsy, unspecified: Secondary | ICD-10-CM | POA: Diagnosis not present

## 2020-04-12 DIAGNOSIS — Z0000 Encounter for general adult medical examination without abnormal findings: Secondary | ICD-10-CM | POA: Diagnosis not present

## 2020-04-12 DIAGNOSIS — R002 Palpitations: Secondary | ICD-10-CM | POA: Diagnosis not present

## 2020-04-12 DIAGNOSIS — M47897 Other spondylosis, lumbosacral region: Secondary | ICD-10-CM | POA: Diagnosis not present

## 2020-04-12 DIAGNOSIS — E782 Mixed hyperlipidemia: Secondary | ICD-10-CM | POA: Diagnosis not present

## 2020-04-12 DIAGNOSIS — M85852 Other specified disorders of bone density and structure, left thigh: Secondary | ICD-10-CM | POA: Diagnosis not present

## 2020-04-12 DIAGNOSIS — G43909 Migraine, unspecified, not intractable, without status migrainosus: Secondary | ICD-10-CM | POA: Diagnosis not present

## 2020-04-15 ENCOUNTER — Encounter: Payer: Self-pay | Admitting: Physical Medicine and Rehabilitation

## 2020-04-15 DIAGNOSIS — E039 Hypothyroidism, unspecified: Secondary | ICD-10-CM | POA: Insufficient documentation

## 2020-04-15 DIAGNOSIS — M19049 Primary osteoarthritis, unspecified hand: Secondary | ICD-10-CM | POA: Insufficient documentation

## 2020-04-15 NOTE — Progress Notes (Signed)
[name] - 75 y.o. female MRN [id number]  Date of birth: Jul 11, 1945  Office Visit Note: Visit Date: 04/10/2020 PCP: Cari Caraway, MD Referred by: Cari Caraway, MD  Subjective: Chief Complaint  Patient presents with  . Lower Back - Pain   HPI: [place] Ylianna Almanzar is a 75 y.o. female who comes in today For brief evaluation and management of ongoing low back pain and right radicular leg pain.  Patient is here with her husband who provides some of the history.  Also present today is [employer] from [employer].  Patient's brief history is that she was followed by Dr. Basil Dess and after trying and failing all manner of conservative care including medication management and injections we ultimately completed spinal cord stimulator trial which she had great success with and was very happy with the results of the trial.  She has since undergone implantation by Dr. Davy Pique.  My understanding is an 8 contact dual-lead was utilized whereas in the trial we used 16 contact leads.  This is a common issue and nothing outstanding.  Unfortunately the patient has not gotten nearly as much relief during the implantation phase that she did there in the trial phase.  It is also my understanding that the leads may be a little bit more to the left than midline.  Patient does have some scoliosis do to history of cerebral palsy.  Tillie Rung is going to try to reprogram again today and reprogramming has taken place on several occasions without as much success.  No red flag symptoms or complaints.  No new trauma no other new issues discontinued chronic recalcitrant low back and hip and leg pain on the right.  Back and leg pain are equal.  Review of Systems  Musculoskeletal: Positive for back pain and joint pain.  All other systems reviewed and are negative.  Otherwise per HPI.  Assessment & Plan: Visit Diagnoses:    ICD-10-CM   1. Lumbar radiculopathy  M54.16   2. Chronic pain syndrome   G89.4   3. S/P insertion of spinal cord stimulator  Z96.89      Plan: Findings:  Chronic worsening recalcitrant low back and right hip and leg pain radicular nature with spine findings of stenosis really not amenable to surgical correction which without being a big correction with a history of cerebral palsy and trouble with anesthesia at times.  Did well with spinal cord stimulator trial and was very happy with those results.  16 contact lead used during the trial and 8 contact lead for implantation with Dr. Davy Pique.  Questionable little bit more left-sided placement.  Patient does have some scoliosis of the thoracolumbar spine.  Recommendation would be for referral to [facility] to Dr. Wylene Men for consideration of modifying or adding additional right-sided lead.  The patient and her husband only look at the cost of going potentially out of network and they will get back to [place].  We will make necessary referrals as needed.  Should continue with current medication management and current stimulator.    Meds & Orders: No orders of the defined types were placed in this encounter.  No orders of the defined types were placed in this encounter.   Follow-up: Return if symptoms worsen or fail to improve.   Procedures: No procedures performed      Clinical History: MRI LUMBAR SPINE WITHOUT CONTRAST  TECHNIQUE: Multiplanar, multisequence MR imaging of the lumbar spine was performed. No intravenous contrast was administered.  COMPARISON: 01/19/2018  FINDINGS: Segmentation: Standard.  Alignment: Minimal retrolisthesis of L2 on L3.  Vertebrae: No fracture, evidence of discitis, or bone lesion.  Conus medullaris and cauda equina: Conus extends to the L1-2 level. Conus and cauda equina appear normal.  Paraspinal and other soft tissues: No acute paraspinal abnormality.  Disc levels:  Disc spaces: Degenerative disease with disc height loss at L4-5 and L5-S1 and to lesser extent  L3-4.  T12-L1: No significant disc bulge. No evidence of neural foraminal stenosis. No central canal stenosis.  L1-L2: No significant disc bulge. No evidence of neural foraminal stenosis. No central canal stenosis.  L2-L3: Mild broad-based disc bulge. Mild bilateral facet arthropathy. No evidence of neural foraminal stenosis. No central canal stenosis.  L3-L4: Mild broad-based disc bulge. Mild bilateral facet arthropathy. Bilateral lateral recess narrowing. No evidence of neural foraminal stenosis. Mild spinal stenosis.  L4-L5: Broad-based disc bulge flattening the ventral thecal sac. Moderate bilateral facet arthropathy. Bilateral lateral recess narrowing. Moderate spinal stenosis. Mild right foraminal stenosis. No left foraminal stenosis.  L5-S1: Broad-based disc bulge with a small central disc protrusion. Moderate bilateral facet arthropathy. No central canal stenosis.  IMPRESSION: 1. Lumbar spine spondylosis as described above. No significant interval change compared with the prior exam. 2. At L4-5 there is a broad-based disc bulge flattening the ventral thecal sac. Moderate bilateral facet arthropathy. Bilateral lateral recess narrowing. Moderate spinal stenosis. Mild right foraminal stenosis.   Electronically Signed  By: Kathreen Devoid  On: 11/24/2018 12:23   She reports that she has never smoked. She has never used smokeless tobacco. No results for input(s): HGBA1C, LABURIC in the last 8760 hours.  Objective:  VS:  HT:    WT:   BMI:     BP:   HR: bpm  TEMP: ( )  RESP:  Physical Exam Vitals and nursing note reviewed.  Constitutional:      General: She is not in acute distress.    Appearance: Normal appearance. She is not ill-appearing.  HENT:     Head: Normocephalic and atraumatic.     Right Ear: External ear normal.     Left Ear: External ear normal.  Eyes:     Extraocular Movements: Extraocular movements intact.  Cardiovascular:     Rate and Rhythm:  Normal rate.     Pulses: Normal pulses.  Pulmonary:     Effort: Pulmonary effort is normal. No respiratory distress.  Abdominal:     General: There is no distension.     Palpations: Abdomen is soft.  Musculoskeletal:        General: Tenderness present.     Cervical back: Neck supple.     Right lower leg: No edema.     Left lower leg: No edema.     Comments: Patient has good distal strength with no pain over the greater trochanters.  No clonus or focal weakness.  She does have some spasticity that is mild.  She does ambulate with a walker with some mild scissoring gait.  Skin:    Findings: No erythema, lesion or rash.  Neurological:     General: No focal deficit present.     Mental Status: She is alert and oriented to person, place, and time.     Sensory: No sensory deficit.     Motor: No weakness or abnormal muscle tone.     Coordination: Coordination normal.  Psychiatric:        Mood and Affect: Mood normal.  Behavior: Behavior normal.     Ortho Exam  Imaging: No results found.  Past Medical/Family/Surgical/Social History: Medications & Allergies reviewed per EMR, new medications updated. Patient Active Problem List   Diagnosis Date Noted  . Hypothyroidism 04/15/2020  . 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 Paroxysmal tachycardia, unspecified ([place]) 03/17/2018  . Cerebral palsy ([place]) 03/01/2018  . Lumbar radiculopathy 03/01/2018  . 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 Radiculopathy due to lumbar intervertebral disc disorder 03/01/2018  . Migraine headache   . High cholesterol 06/11/2003   Past Medical History:  Diagnosis Date  . Cerebral palsy (HCC)    Mild symptoms.  . Migraine headache    Family History  Problem Relation Age of Onset  . Breast cancer Maternal Grandmother   . Colon cancer Maternal Grandmother   . Atrial fibrillation Mother 81       Status post pacemaker  . Hypertension Mother   .  Stroke Mother   . Hypercholesterolemia Mother   . COPD Mother   . Hypothyroidism Mother   . CAD Mother        Diagnosed late  . Atrial fibrillation Maternal Grandfather        Dilated age 45  . Atrial fibrillation Maternal Uncle   . Hodgkin's lymphoma Father        Died @ 24  . Lung cancer Father   . Pancreatic cancer Sister   . Hypertension Sister   . Hyperlipidemia Sister    Past Surgical History:  Procedure Laterality Date  . CHOLECYSTECTOMY  1989  . TEMPOROMANDIBULAR JOINT ARTHROPLASTY    . Thyroid nodule resection  2005  . TOTAL ABDOMINAL HYSTERECTOMY W/ BILATERAL SALPINGOOPHORECTOMY  1982   Social History   Occupational History  . Not on file  Tobacco Use  . Smoking status: Never Smoker  . Smokeless tobacco: Never Used  Substance and Sexual Activity  . Alcohol use: Never  . Drug use: Never  . Sexual activity: Not on file

## 2020-04-18 ENCOUNTER — Other Ambulatory Visit: Payer: Self-pay | Admitting: Physical Medicine and Rehabilitation

## 2020-04-19 DIAGNOSIS — G809 Cerebral palsy, unspecified: Secondary | ICD-10-CM | POA: Diagnosis not present

## 2020-04-19 DIAGNOSIS — Z9889 Other specified postprocedural states: Secondary | ICD-10-CM | POA: Diagnosis not present

## 2020-04-19 DIAGNOSIS — Z6824 Body mass index (BMI) 24.0-24.9, adult: Secondary | ICD-10-CM | POA: Diagnosis not present

## 2020-04-19 DIAGNOSIS — E039 Hypothyroidism, unspecified: Secondary | ICD-10-CM | POA: Diagnosis not present

## 2020-04-19 DIAGNOSIS — Z8639 Personal history of other endocrine, nutritional and metabolic disease: Secondary | ICD-10-CM | POA: Diagnosis not present

## 2020-05-03 DIAGNOSIS — T85122A Displacement of implanted electronic neurostimulator (electrode) of spinal cord, initial encounter: Secondary | ICD-10-CM | POA: Diagnosis not present

## 2020-05-03 DIAGNOSIS — Z79899 Other long term (current) drug therapy: Secondary | ICD-10-CM | POA: Diagnosis not present

## 2020-05-03 DIAGNOSIS — M5417 Radiculopathy, lumbosacral region: Secondary | ICD-10-CM | POA: Diagnosis not present

## 2020-05-03 DIAGNOSIS — Z5181 Encounter for therapeutic drug level monitoring: Secondary | ICD-10-CM | POA: Diagnosis not present

## 2020-05-03 DIAGNOSIS — G894 Chronic pain syndrome: Secondary | ICD-10-CM | POA: Diagnosis not present

## 2020-05-07 DIAGNOSIS — M47816 Spondylosis without myelopathy or radiculopathy, lumbar region: Secondary | ICD-10-CM | POA: Diagnosis not present

## 2020-05-07 DIAGNOSIS — M47897 Other spondylosis, lumbosacral region: Secondary | ICD-10-CM | POA: Diagnosis not present

## 2020-05-07 DIAGNOSIS — M19049 Primary osteoarthritis, unspecified hand: Secondary | ICD-10-CM | POA: Diagnosis not present

## 2020-05-07 DIAGNOSIS — E782 Mixed hyperlipidemia: Secondary | ICD-10-CM | POA: Diagnosis not present

## 2020-05-07 DIAGNOSIS — E039 Hypothyroidism, unspecified: Secondary | ICD-10-CM | POA: Diagnosis not present

## 2020-06-02 DIAGNOSIS — Z20822 Contact with and (suspected) exposure to covid-19: Secondary | ICD-10-CM | POA: Diagnosis not present

## 2020-06-02 DIAGNOSIS — Z01812 Encounter for preprocedural laboratory examination: Secondary | ICD-10-CM | POA: Diagnosis not present

## 2020-06-05 DIAGNOSIS — E785 Hyperlipidemia, unspecified: Secondary | ICD-10-CM | POA: Diagnosis not present

## 2020-06-05 DIAGNOSIS — G894 Chronic pain syndrome: Secondary | ICD-10-CM | POA: Diagnosis not present

## 2020-06-05 DIAGNOSIS — Z79899 Other long term (current) drug therapy: Secondary | ICD-10-CM | POA: Diagnosis not present

## 2020-06-05 DIAGNOSIS — Z888 Allergy status to other drugs, medicaments and biological substances status: Secondary | ICD-10-CM | POA: Diagnosis not present

## 2020-06-05 DIAGNOSIS — T85192A Other mechanical complication of implanted electronic neurostimulator (electrode) of spinal cord, initial encounter: Secondary | ICD-10-CM | POA: Diagnosis not present

## 2020-06-05 DIAGNOSIS — T85122A Displacement of implanted electronic neurostimulator (electrode) of spinal cord, initial encounter: Secondary | ICD-10-CM | POA: Diagnosis not present

## 2020-06-05 DIAGNOSIS — Z885 Allergy status to narcotic agent status: Secondary | ICD-10-CM | POA: Diagnosis not present

## 2020-06-05 DIAGNOSIS — M5417 Radiculopathy, lumbosacral region: Secondary | ICD-10-CM | POA: Diagnosis not present

## 2020-06-05 DIAGNOSIS — Z880 Allergy status to penicillin: Secondary | ICD-10-CM | POA: Diagnosis not present

## 2020-06-05 DIAGNOSIS — Z882 Allergy status to sulfonamides status: Secondary | ICD-10-CM | POA: Diagnosis not present

## 2020-06-12 ENCOUNTER — Other Ambulatory Visit: Payer: Self-pay | Admitting: Cardiology

## 2020-06-19 DIAGNOSIS — M79609 Pain in unspecified limb: Secondary | ICD-10-CM | POA: Diagnosis not present

## 2020-06-19 DIAGNOSIS — G90521 Complex regional pain syndrome I of right lower limb: Secondary | ICD-10-CM | POA: Diagnosis not present

## 2020-06-23 DIAGNOSIS — R319 Hematuria, unspecified: Secondary | ICD-10-CM | POA: Diagnosis not present

## 2020-06-23 DIAGNOSIS — N390 Urinary tract infection, site not specified: Secondary | ICD-10-CM | POA: Diagnosis not present

## 2020-07-05 ENCOUNTER — Other Ambulatory Visit: Payer: Self-pay | Admitting: Family Medicine

## 2020-07-05 DIAGNOSIS — Z1231 Encounter for screening mammogram for malignant neoplasm of breast: Secondary | ICD-10-CM

## 2020-07-17 DIAGNOSIS — M79609 Pain in unspecified limb: Secondary | ICD-10-CM | POA: Diagnosis not present

## 2020-07-17 DIAGNOSIS — G90521 Complex regional pain syndrome I of right lower limb: Secondary | ICD-10-CM | POA: Diagnosis not present

## 2020-07-17 DIAGNOSIS — M5417 Radiculopathy, lumbosacral region: Secondary | ICD-10-CM | POA: Diagnosis not present

## 2020-07-17 DIAGNOSIS — G894 Chronic pain syndrome: Secondary | ICD-10-CM | POA: Diagnosis not present

## 2020-07-17 DIAGNOSIS — T85122A Displacement of implanted electronic neurostimulator (electrode) of spinal cord, initial encounter: Secondary | ICD-10-CM | POA: Diagnosis not present

## 2020-07-20 DIAGNOSIS — Z87440 Personal history of urinary (tract) infections: Secondary | ICD-10-CM | POA: Diagnosis not present

## 2020-08-05 IMAGING — MG DIGITAL SCREENING BILATERAL MAMMOGRAM WITH TOMO AND CAD
8 series · 9 of 24 positions shown · non-contrast
Comparison: Previous exam(s).

CLINICAL DATA: Screening.

EXAM:
DIGITAL SCREENING BILATERAL MAMMOGRAM WITH TOMO AND CAD

[L CC synth-2D]
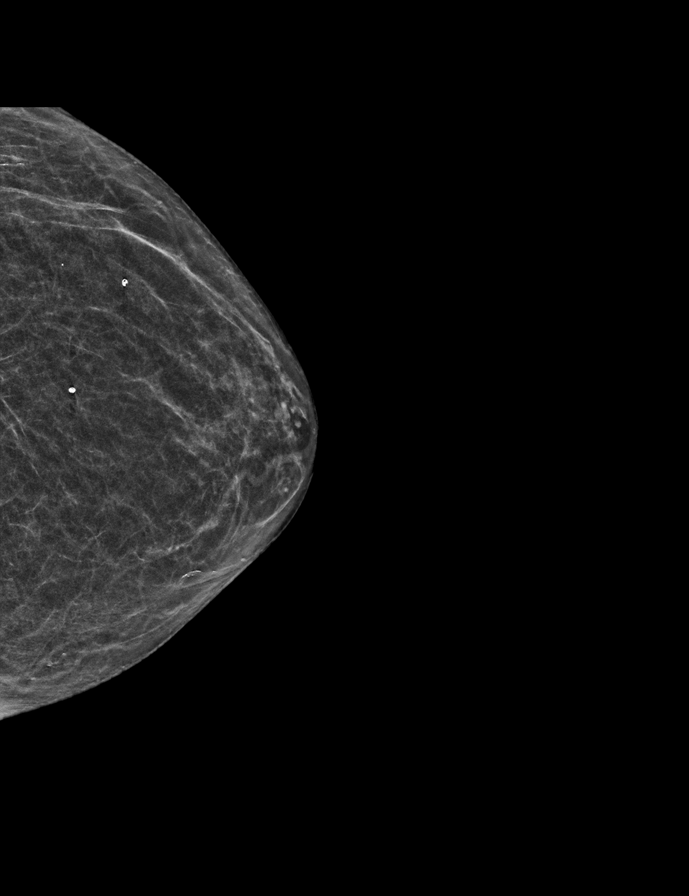

[R CC synth-2D]
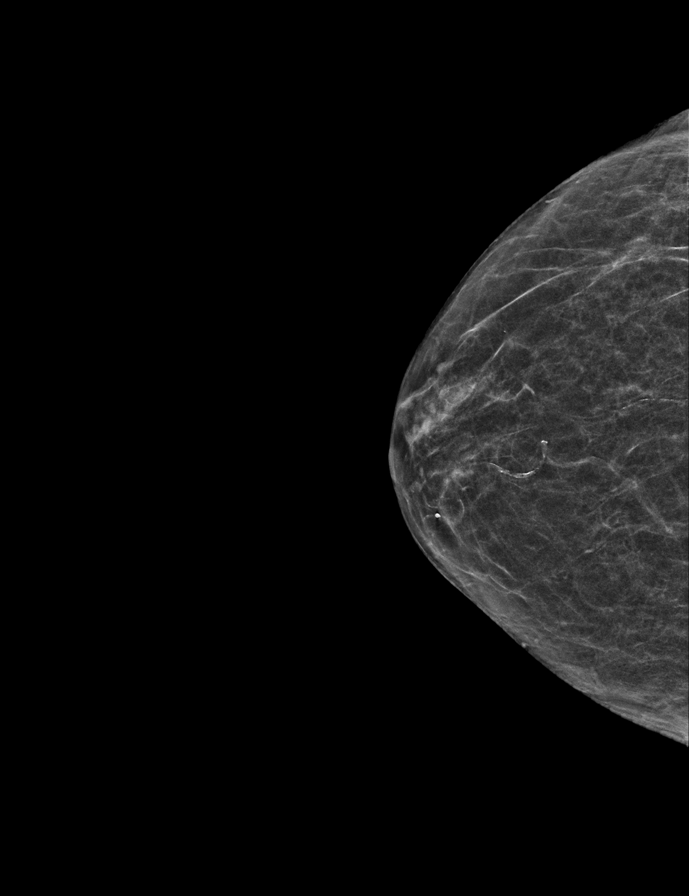

[L MLO synth-2D]
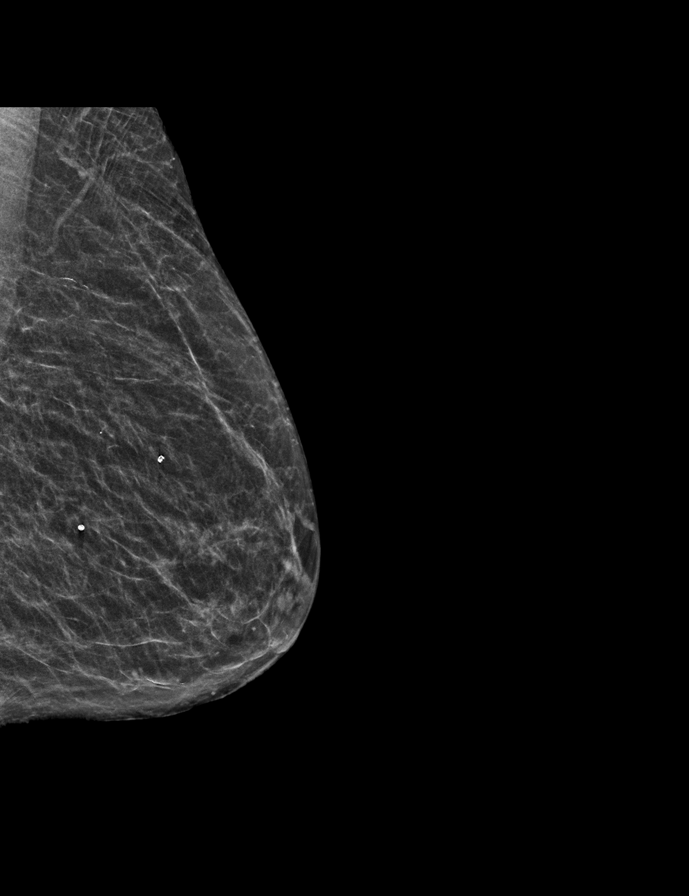

[R MLO synth-2D]
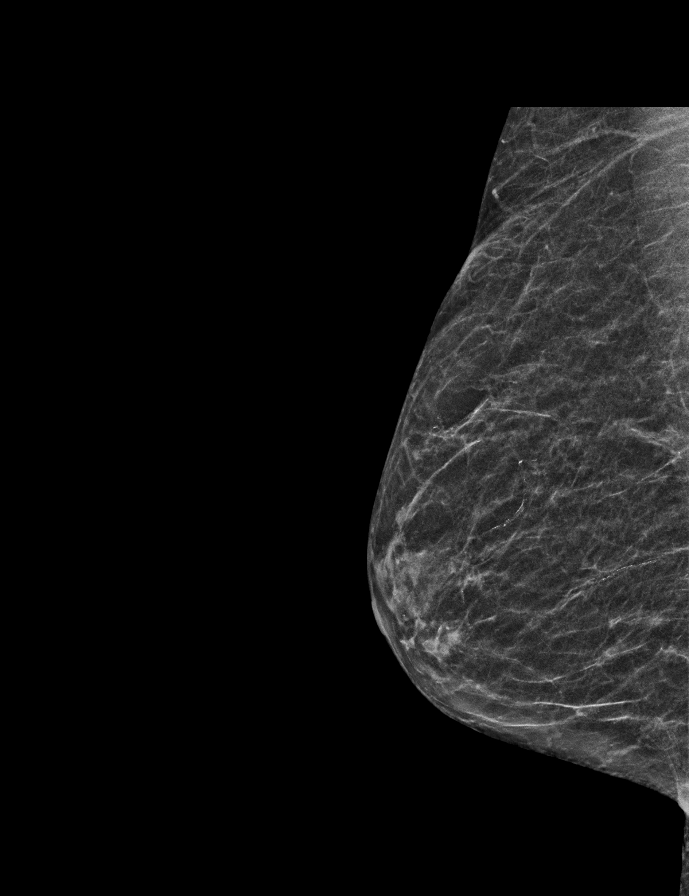

[L MLO tomo · 2 of 45 frames shown]
[frame 15/45]
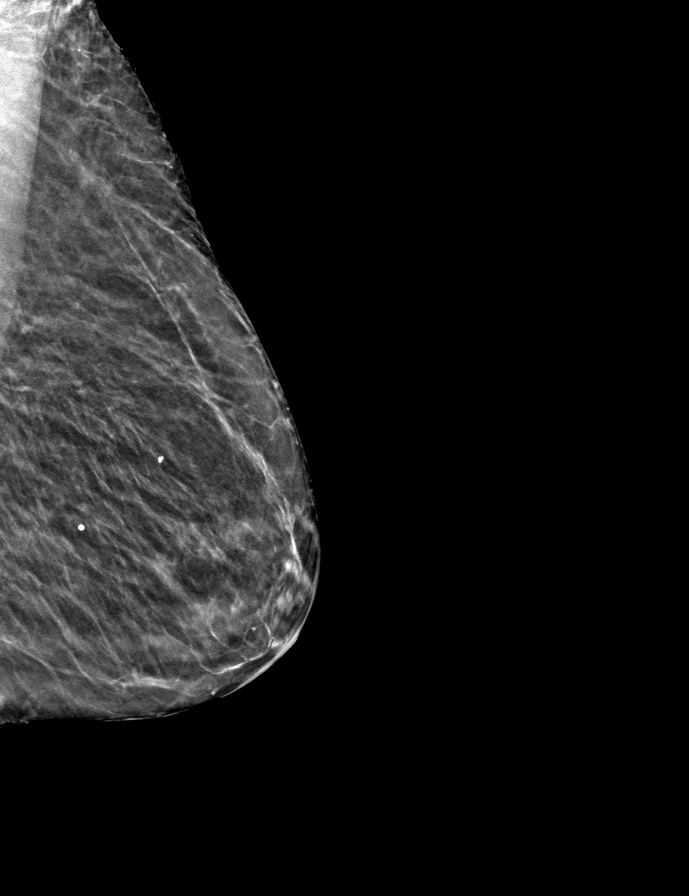
[frame 23/45]
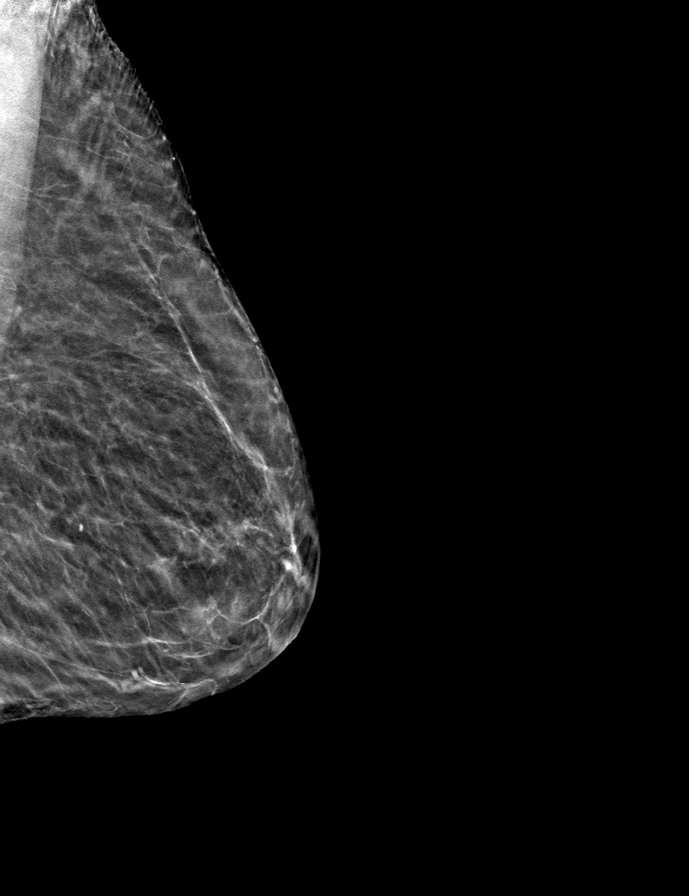

[L CC tomo · tomo slice 23/44.0]
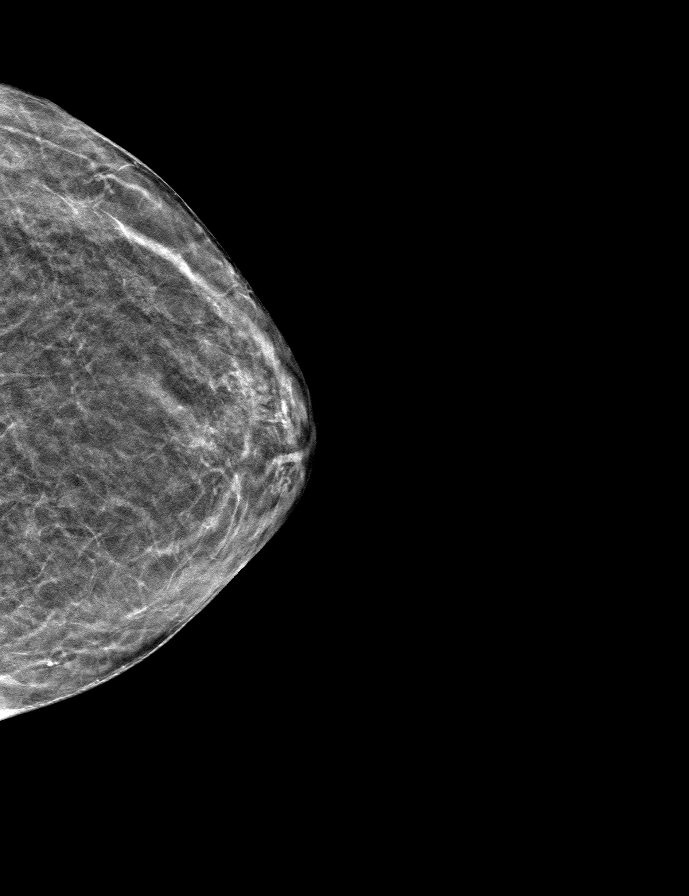

[R CC tomo · tomo slice 21/42.0]
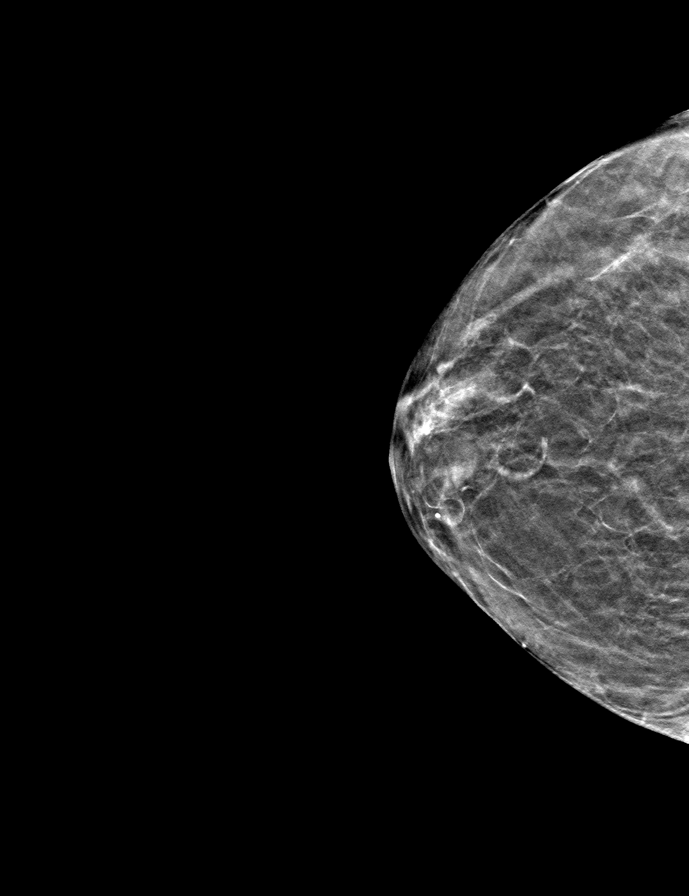

[R MLO tomo · tomo slice 19/38.0]
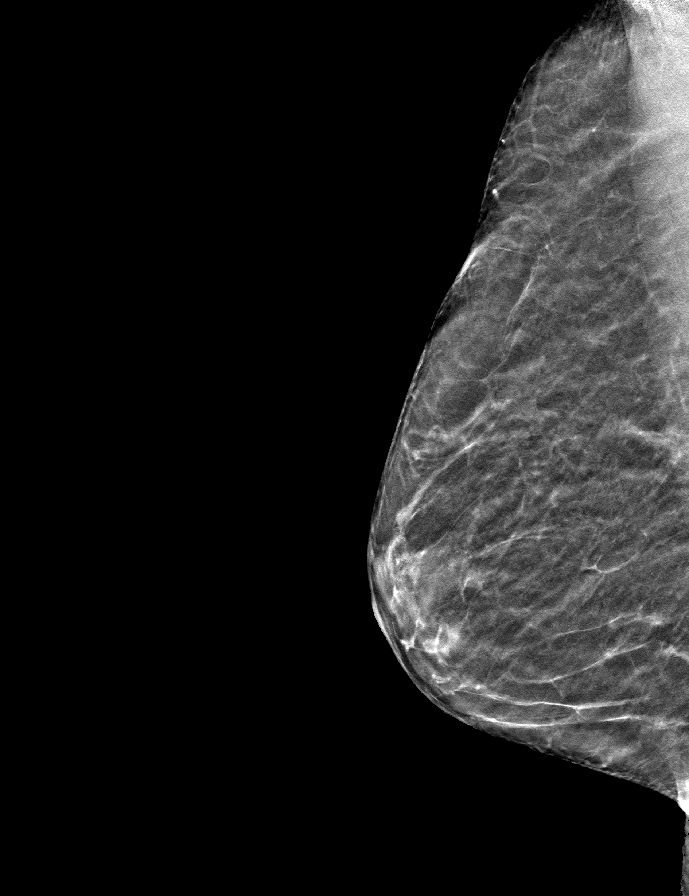

[9 of 24 positions shown; findings below may reference images not displayed]

ACR Breast Density Category b: There are scattered areas of
fibroglandular density.
FINDINGS: There are no findings suspicious for malignancy. Images were
processed with CAD.
IMPRESSION: No mammographic evidence of malignancy. A result letter of this
screening mammogram will be mailed directly to the patient.

RECOMMENDATION:
Screening mammogram in one year. (Code:[id number])

BI-RADS CATEGORY  1: Negative.

## 2020-08-14 DIAGNOSIS — G90521 Complex regional pain syndrome I of right lower limb: Secondary | ICD-10-CM | POA: Diagnosis not present

## 2020-08-14 DIAGNOSIS — T85122A Displacement of implanted electronic neurostimulator (electrode) of spinal cord, initial encounter: Secondary | ICD-10-CM | POA: Diagnosis not present

## 2020-08-14 DIAGNOSIS — M79609 Pain in unspecified limb: Secondary | ICD-10-CM | POA: Diagnosis not present

## 2020-08-14 DIAGNOSIS — M5417 Radiculopathy, lumbosacral region: Secondary | ICD-10-CM | POA: Diagnosis not present

## 2020-08-14 DIAGNOSIS — G894 Chronic pain syndrome: Secondary | ICD-10-CM | POA: Diagnosis not present

## 2020-08-15 ENCOUNTER — Encounter: Payer: Self-pay | Admitting: Physical Medicine and Rehabilitation

## 2020-08-21 ENCOUNTER — Ambulatory Visit (INDEPENDENT_AMBULATORY_CARE_PROVIDER_SITE_OTHER): Payer: PPO | Admitting: Physical Medicine and Rehabilitation

## 2020-08-21 ENCOUNTER — Other Ambulatory Visit: Payer: Self-pay

## 2020-08-21 ENCOUNTER — Ambulatory Visit: Payer: Self-pay

## 2020-08-21 DIAGNOSIS — M461 Sacroiliitis, not elsewhere classified: Secondary | ICD-10-CM

## 2020-08-21 NOTE — Progress Notes (Signed)
Pt state lower back pain. Pt state bending and getting up from chair or bed. Pt state she takes over the counter pain meds to help ease her pain.  Numeric Pain Rating Scale and Functional Assessment Average Pain 8   In the last MONTH (on 0-10 scale) has pain interfered with the following?  1. General activity like being  able to carry out your everyday physical activities such as walking, climbing stairs, carrying groceries, or moving a chair?  Rating(10)   +Driver, -BT, -Dye Allergies.

## 2020-08-21 NOTE — Progress Notes (Signed)
Holly Mcconnell - 75 y.o. female MRN [id number]  Date of birth: [birth year]  Office Visit Note: Visit Date: 08/21/2020 PCP: Cari Caraway, MD Referred by: Cari Caraway, MD  Subjective: Chief Complaint  Patient presents with   Lower Back - Pain   HPI:  Holly Mcconnell is a 75 y.o. female who comes in today at the request of Dr. Wylene Men for planned Right anesthetic sacroiliac joint arthrogram with fluoroscopic guidance.  The patient has failed conservative care including home exercise, medications, time and activity modification.  This injection will be diagnostic and hopefully therapeutic.  Please see requesting physician notes for further details and justification.  She is status post spinal cord stimulator implant and revision.  She is doing extremely well with her back and leg pain at this point with the stimulator.  She still has this problematic area in the right mid to upper buttock low back region.  ROS Otherwise per HPI.  Assessment & Plan: Visit Diagnoses:    ICD-10-CM   1. Sacroiliitis (HCC)  M46.1 Sacroiliac Joint Inj    XR C-ARM NO REPORT      Plan: No additional findings.   Meds & Orders: No orders of the defined types were placed in this encounter.   Orders Placed This Encounter  Procedures   Sacroiliac Joint Inj   XR C-ARM NO REPORT    Follow-up: Return if symptoms worsen or fail to improve.   Procedures: Sacroiliac Joint Inj on 08/21/2020 1:18 PM Indications: pain and diagnostic evaluation Details: 22 G 3.5 in needle, fluoroscopy-guided posterior approach Medications: 2 mL bupivacaine 0.5 %; 80 mg methylPREDNISolone acetate 80 MG/ML Outcome: tolerated well, no immediate complications  Sacroiliac Joint Intra-Articular Injection - Posterior Approach with Fluoroscopic Guidance   Position: PRONE  Additional Comments: Vital signs were monitored before and after the procedure. Patient was prepped and draped in the usual sterile  fashion. The correct patient, procedure, and site was verified.   Injection Procedure Details:   Location/Site:  Sacroiliac joint  Needle size: 3.5 in Spinal Needle  Needle type: Spinal  Needle Placement: Intra-articular  Findings:  -Comments: There was excellent flow of contrast producing a partial arthrogram of the sacroiliac joint.   Procedure Details: Starting with a 90 degree vertical and midline orientation the fluoroscope was tilted cranially 20 to 25 degrees and the target area of the inferior most part of the SI joint on the side mentioned above was visualized.  The soft tissues overlying this target were infiltrated with 4 ml. of 1% Lidocaine without Epinephrine. A #22 gauge spinal needle was inserted perpendicular to the fluoroscope table and advanced into the posterior inferior joint space using fluoroscopic guidance.  Position in the joint space was confirmed by obtaining a partial arthrogram using a 2 ml. volume of Isovue-250 contrast agent. After negative aspirate for gross pus or blood, the injectate was delivered to the joint. Radiographs were obtained for documentation purposes.   Additional Comments:   Dressing: Bandaid    Post-procedure details: Patient was observed during the procedure. Post-procedure instructions were reviewed.  Patient left the clinic in stable condition.    There was excellent flow of contrast producing a partial arthrogram of the sacroiliac joint.  Procedure, treatment alternatives, risks and benefits explained, specific risks discussed. Consent was given by the patient. Immediately prior to procedure a time out was called to verify the correct patient, procedure, equipment, support staff and site/side marked as required. Patient was prepped and draped in the usual  sterile fashion.         Clinical History: MRI LUMBAR SPINE WITHOUT CONTRAST  TECHNIQUE: Multiplanar, multisequence MR imaging of the lumbar spine was performed. No  intravenous contrast was administered.  COMPARISON: 01/19/2018  FINDINGS: Segmentation: Standard.  Alignment: Minimal retrolisthesis of L2 on L3.  Vertebrae: No fracture, evidence of discitis, or bone lesion.  Conus medullaris and cauda equina: Conus extends to the L1-2 level. Conus and cauda equina appear normal.  Paraspinal and other soft tissues: No acute paraspinal abnormality.  Disc levels:  Disc spaces: Degenerative disease with disc height loss at L4-5 and L5-S1 and to lesser extent L3-4.  T12-L1: No significant disc bulge. No evidence of neural foraminal stenosis. No central canal stenosis.  L1-L2: No significant disc bulge. No evidence of neural foraminal stenosis. No central canal stenosis.  L2-L3: Mild broad-based disc bulge. Mild bilateral facet arthropathy. No evidence of neural foraminal stenosis. No central canal stenosis.  L3-L4: Mild broad-based disc bulge. Mild bilateral facet arthropathy. Bilateral lateral recess narrowing. No evidence of neural foraminal stenosis. Mild spinal stenosis.  L4-L5: Broad-based disc bulge flattening the ventral thecal sac. Moderate bilateral facet arthropathy. Bilateral lateral recess narrowing. Moderate spinal stenosis. Mild right foraminal stenosis. No left foraminal stenosis.  L5-S1: Broad-based disc bulge with a small central disc protrusion. Moderate bilateral facet arthropathy. No central canal stenosis.  IMPRESSION: 1. Lumbar spine spondylosis as described above. No significant interval change compared with the prior exam. 2. At L4-5 there is a broad-based disc bulge flattening the ventral thecal sac. Moderate bilateral facet arthropathy. Bilateral lateral recess narrowing. Moderate spinal stenosis. Mild right foraminal stenosis.   Electronically Signed  By: Kathreen Devoid  On: 11/24/2018 12:23     Objective:  VS:  HT:    WT:   BMI:     BP:   HR: bpm  TEMP: ( )  RESP:  Physical Exam   Imaging: XR  C-ARM NO REPORT  Result Date: 08/21/2020 Please see Notes tab for imaging impression.

## 2020-08-22 DIAGNOSIS — H524 Presbyopia: Secondary | ICD-10-CM | POA: Diagnosis not present

## 2020-08-22 DIAGNOSIS — H11042 Peripheral pterygium, stationary, left eye: Secondary | ICD-10-CM | POA: Diagnosis not present

## 2020-08-22 DIAGNOSIS — H04123 Dry eye syndrome of bilateral lacrimal glands: Secondary | ICD-10-CM | POA: Diagnosis not present

## 2020-08-22 DIAGNOSIS — H25813 Combined forms of age-related cataract, bilateral: Secondary | ICD-10-CM | POA: Diagnosis not present

## 2020-08-22 DIAGNOSIS — H18452 Nodular corneal degeneration, left eye: Secondary | ICD-10-CM | POA: Diagnosis not present

## 2020-08-22 MED ORDER — BUPIVACAINE HCL 0.5 % IJ SOLN
2.0000 mL | INTRAMUSCULAR | Status: AC | PRN
  Administered 2020-08-21: 2 mL via INTRA_ARTICULAR

## 2020-08-22 MED ORDER — METHYLPREDNISOLONE ACETATE 80 MG/ML IJ SUSP
80.0000 mg | INTRAMUSCULAR | Status: AC | PRN
  Administered 2020-08-21: 80 mg via INTRA_ARTICULAR

## 2020-08-28 ENCOUNTER — Ambulatory Visit
Admission: RE | Admit: 2020-08-28 | Discharge: 2020-08-28 | Disposition: A | Discharge status: Home / Self Care | Payer: PPO | Source: Ambulatory Visit | Attending: Family Medicine | Admitting: Family Medicine

## 2020-08-28 ENCOUNTER — Other Ambulatory Visit: Payer: Self-pay

## 2020-08-28 DIAGNOSIS — Z1231 Encounter for screening mammogram for malignant neoplasm of breast: Secondary | ICD-10-CM | POA: Diagnosis not present

## 2020-08-30 ENCOUNTER — Other Ambulatory Visit: Payer: Self-pay | Admitting: Family Medicine

## 2020-08-30 DIAGNOSIS — R928 Other abnormal and inconclusive findings on diagnostic imaging of breast: Secondary | ICD-10-CM

## 2020-09-10 DIAGNOSIS — H25813 Combined forms of age-related cataract, bilateral: Secondary | ICD-10-CM | POA: Diagnosis not present

## 2020-09-10 DIAGNOSIS — H25812 Combined forms of age-related cataract, left eye: Secondary | ICD-10-CM | POA: Diagnosis not present

## 2020-09-20 ENCOUNTER — Ambulatory Visit
Admission: RE | Admit: 2020-09-20 | Discharge: 2020-09-20 | Disposition: A | Discharge status: Home / Self Care | Payer: PPO | Source: Ambulatory Visit | Attending: Family Medicine | Admitting: Family Medicine

## 2020-09-20 ENCOUNTER — Other Ambulatory Visit: Payer: Self-pay

## 2020-09-20 DIAGNOSIS — R928 Other abnormal and inconclusive findings on diagnostic imaging of breast: Secondary | ICD-10-CM

## 2020-09-20 DIAGNOSIS — R922 Inconclusive mammogram: Secondary | ICD-10-CM | POA: Diagnosis not present

## 2020-09-25 DIAGNOSIS — H25812 Combined forms of age-related cataract, left eye: Secondary | ICD-10-CM | POA: Diagnosis not present

## 2020-09-25 DIAGNOSIS — H2512 Age-related nuclear cataract, left eye: Secondary | ICD-10-CM | POA: Diagnosis not present

## 2020-09-25 DIAGNOSIS — H268 Other specified cataract: Secondary | ICD-10-CM | POA: Diagnosis not present

## 2020-10-01 DIAGNOSIS — H25811 Combined forms of age-related cataract, right eye: Secondary | ICD-10-CM | POA: Diagnosis not present

## 2020-10-03 ENCOUNTER — Encounter: Payer: Self-pay | Admitting: Physical Medicine and Rehabilitation

## 2020-10-03 ENCOUNTER — Other Ambulatory Visit: Payer: Self-pay

## 2020-10-03 ENCOUNTER — Ambulatory Visit (INDEPENDENT_AMBULATORY_CARE_PROVIDER_SITE_OTHER): Payer: PPO | Admitting: Physical Medicine and Rehabilitation

## 2020-10-03 VITALS — BP 97/67 | HR 54

## 2020-10-03 DIAGNOSIS — G894 Chronic pain syndrome: Secondary | ICD-10-CM | POA: Diagnosis not present

## 2020-10-03 DIAGNOSIS — M5416 Radiculopathy, lumbar region: Secondary | ICD-10-CM | POA: Diagnosis not present

## 2020-10-03 DIAGNOSIS — G809 Cerebral palsy, unspecified: Secondary | ICD-10-CM

## 2020-10-03 DIAGNOSIS — M48062 Spinal stenosis, lumbar region with neurogenic claudication: Secondary | ICD-10-CM | POA: Diagnosis not present

## 2020-10-03 DIAGNOSIS — Z9689 Presence of other specified functional implants: Secondary | ICD-10-CM | POA: Diagnosis not present

## 2020-10-03 NOTE — Progress Notes (Signed)
Holly Mcconnell - 75 y.o. female MRN [id number]  Date of birth: [birth year]  Office Visit Note: Visit Date: 10/03/2020 PCP: Cari Caraway, MD Referred by: Cari Caraway, MD  Subjective: Chief Complaint  Patient presents with   Lower Back - Pain   HPI: Holly Mcconnell is a 75 y.o. female who comes in today for evaluation of chronic, worsening and severe right sided lower back pain radiating to buttock, lateral leg and ankle. Patient reports pain has been chronic for several years, but worsened over the last few months. Patient reports pain increases with walking and activity, describes as soreness sensation and rates 8 out of 10 at present. Patient reports some pain relief with stretching exercises, rest and medications. Patient's recent lumbar MRI from 2020 exhibits moderate broad based disc bulge, moderate spinal stenosis, and moderate bilateral facet arthropathy at L4-L5. Patient had right L4-L5 transforaminal epidural steroid injection in 2021 with minimal relief of pain. Patient had spinal cord stimulator placed in 2021 by Dr. Lenord Carbo with lead revision by Dr. Wylene Men in 2022.  Patient denies numbness and tingling.  Patient denies any recent trauma or falls.  Patient's course is complicated by cerebral palsy  Patient uses walker to aid with balance and ambulation.  Review of Systems  Musculoskeletal:  Positive for back pain.  Neurological:  Negative for tingling.  Otherwise per HPI.  Assessment & Plan: Visit Diagnoses:    ICD-10-CM   1. Lumbar radiculopathy  M54.16     2. Spinal stenosis of lumbar region with neurogenic claudication  M48.062     3. S/P insertion of spinal cord stimulator  Z96.89     4. Chronic pain syndrome  G89.4     5. Cerebral palsy, unspecified type ([place])  G80.9        Plan: Findings:  Chronic, worsening and severe right-sided lower back pain radiating to buttock, lateral leg and ankle.  Patient's clinical presentation is  consistent with classic L5 distribution. Patient continues to have severe pain despite spinal cord stimulator placement. We feel the next step is to have her spinal cord stimulator reprogrammed. Tillie Rung with [employer] contacted and will set up appointment with patient for reprogramming.  If patient does not get significant relief of pain after reprogramming of spinal cord stimulator we will then consider a right L5-S1 interlaminar epidural steroid injection after her cataract surgery is complete. Patient encouraged to continue conservative therapies such as home exercise program and use of medications to help alleviate pain.  No red flag symptoms noted upon exam.   Meds & Orders: No orders of the defined types were placed in this encounter.  No orders of the defined types were placed in this encounter.   Follow-up: Return in about 1 week (around 10/10/2020) for Right L5-S1 interlaminar epidural steroid injection.   Procedures: No procedures performed      Clinical History: MRI LUMBAR SPINE WITHOUT CONTRAST  TECHNIQUE: Multiplanar, multisequence MR imaging of the lumbar spine was performed. No intravenous contrast was administered.  COMPARISON: 01/19/2018  FINDINGS: Segmentation: Standard.  Alignment: Minimal retrolisthesis of L2 on L3.  Vertebrae: No fracture, evidence of discitis, or bone lesion.  Conus medullaris and cauda equina: Conus extends to the L1-2 level. Conus and cauda equina appear normal.  Paraspinal and other soft tissues: No acute paraspinal abnormality.  Disc levels:  Disc spaces: Degenerative disease with disc height loss at L4-5 and L5-S1 and to lesser extent L3-4.  T12-L1: No significant disc bulge. No evidence  of neural foraminal stenosis. No central canal stenosis.  L1-L2: No significant disc bulge. No evidence of neural foraminal stenosis. No central canal stenosis.  L2-L3: Mild broad-based disc bulge. Mild bilateral facet arthropathy. No  evidence of neural foraminal stenosis. No central canal stenosis.  L3-L4: Mild broad-based disc bulge. Mild bilateral facet arthropathy. Bilateral lateral recess narrowing. No evidence of neural foraminal stenosis. Mild spinal stenosis.  L4-L5: Broad-based disc bulge flattening the ventral thecal sac. Moderate bilateral facet arthropathy. Bilateral lateral recess narrowing. Moderate spinal stenosis. Mild right foraminal stenosis. No left foraminal stenosis.  L5-S1: Broad-based disc bulge with a small central disc protrusion. Moderate bilateral facet arthropathy. No central canal stenosis.  IMPRESSION: 1. Lumbar spine spondylosis as described above. No significant interval change compared with the prior exam. 2. At L4-5 there is a broad-based disc bulge flattening the ventral thecal sac. Moderate bilateral facet arthropathy. Bilateral lateral recess narrowing. Moderate spinal stenosis. Mild right foraminal stenosis.   Electronically Signed  By: Kathreen Devoid  On: 11/24/2018 12:23   She reports that she has never smoked. She has never used smokeless tobacco. No results for input(s): HGBA1C, LABURIC in the last 8760 hours.  Objective:  VS:  HT:    WT:   BMI:     BP:97/67  HR:(!) 54bpm  TEMP: ( )  RESP:  Physical Exam HENT:     Head: Normocephalic and atraumatic.     Right Ear: Tympanic membrane normal.     Left Ear: Tympanic membrane normal.     Nose: Nose normal.     Mouth/Throat:     Mouth: Mucous membranes are moist.  Eyes:     Pupils: Pupils are equal, round, and reactive to light.  Cardiovascular:     Rate and Rhythm: Normal rate.     Pulses: Normal pulses.  Pulmonary:     Effort: Pulmonary effort is normal.  Abdominal:     General: Abdomen is flat. There is no distension.  Musculoskeletal:     Cervical back: Normal range of motion and neck supple.     Comments: Pt is slow to rise from seated position to standing without difficulty. Good lumbar range of motion.  Strong distal strength with increased spasticity, no pain upon palpation of greater trochanters. Sensation intact bilaterally. Ambulates with walker, gait unsteady.    Skin:    General: Skin is warm and dry.     Capillary Refill: Capillary refill takes less than 2 seconds.  Neurological:     Mental Status: She is alert.  Psychiatric:        Mood and Affect: Mood normal.    Ortho Exam  Imaging: No results found.  Past Medical/Family/Surgical/Social History: Medications & Allergies reviewed per EMR, new medications updated. 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   Migraine headache    Family History  Problem Relation Age of Onset   Breast cancer Maternal Grandmother    Colon cancer Maternal Grandmother    Atrial fibrillation Mother 42       Status  post pacemaker   Hypertension Mother    Stroke Mother    Hypercholesterolemia Mother    COPD Mother    Hypothyroidism Mother    CAD Mother        Diagnosed late   Atrial fibrillation Maternal Grandfather        Dilated age 22   Atrial fibrillation Maternal Uncle    Hodgkin's lymphoma Father        Died @ 47   Lung cancer Father    Pancreatic cancer Sister    Hypertension Sister    Hyperlipidemia Sister    Past Surgical History:  Procedure Laterality Date   CHOLECYSTECTOMY  1989   TEMPOROMANDIBULAR JOINT ARTHROPLASTY     Thyroid nodule resection  2005   TOTAL ABDOMINAL HYSTERECTOMY W/ BILATERAL SALPINGOOPHORECTOMY  1982    Social History   Occupational History   Not on file  Tobacco Use   Smoking status: Never   Smokeless tobacco: Never  Substance and Sexual Activity   Alcohol use: Never   Drug use: Never   Sexual activity: Not on file

## 2020-10-03 NOTE — Progress Notes (Signed)
SI joint injection on 6/21. Pain went down to 7 or 8 for a very short time. Right sided low back pain. Numeric Pain Rating Scale and Functional Assessment Average Pain 9   In the last MONTH (on 0-10 scale) has pain interfered with the following?  1. General activity like being  able to carry out your everyday physical activities such as walking, climbing stairs, carrying groceries, or moving a chair?  Rating(8)

## 2020-10-08 ENCOUNTER — Encounter: Payer: Self-pay | Admitting: Physical Medicine and Rehabilitation

## 2020-10-09 ENCOUNTER — Ambulatory Visit: Payer: PPO | Admitting: Physical Medicine and Rehabilitation

## 2020-10-09 DIAGNOSIS — T85122A Displacement of implanted electronic neurostimulator (electrode) of spinal cord, initial encounter: Secondary | ICD-10-CM | POA: Diagnosis not present

## 2020-10-09 DIAGNOSIS — G894 Chronic pain syndrome: Secondary | ICD-10-CM | POA: Diagnosis not present

## 2020-10-09 DIAGNOSIS — G90521 Complex regional pain syndrome I of right lower limb: Secondary | ICD-10-CM | POA: Diagnosis not present

## 2020-10-09 DIAGNOSIS — M5417 Radiculopathy, lumbosacral region: Secondary | ICD-10-CM | POA: Diagnosis not present

## 2020-10-09 DIAGNOSIS — M79609 Pain in unspecified limb: Secondary | ICD-10-CM | POA: Diagnosis not present

## 2020-10-17 DIAGNOSIS — E039 Hypothyroidism, unspecified: Secondary | ICD-10-CM | POA: Diagnosis not present

## 2020-10-23 DIAGNOSIS — H25811 Combined forms of age-related cataract, right eye: Secondary | ICD-10-CM | POA: Diagnosis not present

## 2020-10-24 DIAGNOSIS — E039 Hypothyroidism, unspecified: Secondary | ICD-10-CM | POA: Diagnosis not present

## 2020-10-24 DIAGNOSIS — Z8639 Personal history of other endocrine, nutritional and metabolic disease: Secondary | ICD-10-CM | POA: Diagnosis not present

## 2020-10-24 DIAGNOSIS — Z9889 Other specified postprocedural states: Secondary | ICD-10-CM | POA: Diagnosis not present

## 2020-10-24 DIAGNOSIS — Z6825 Body mass index (BMI) 25.0-25.9, adult: Secondary | ICD-10-CM | POA: Diagnosis not present

## 2020-10-24 DIAGNOSIS — G809 Cerebral palsy, unspecified: Secondary | ICD-10-CM | POA: Diagnosis not present

## 2020-11-01 DIAGNOSIS — L718 Other rosacea: Secondary | ICD-10-CM | POA: Diagnosis not present

## 2020-11-01 DIAGNOSIS — D2272 Melanocytic nevi of left lower limb, including hip: Secondary | ICD-10-CM | POA: Diagnosis not present

## 2020-11-01 DIAGNOSIS — L821 Other seborrheic keratosis: Secondary | ICD-10-CM | POA: Diagnosis not present

## 2020-11-01 DIAGNOSIS — D225 Melanocytic nevi of trunk: Secondary | ICD-10-CM | POA: Diagnosis not present

## 2020-11-01 DIAGNOSIS — D2271 Melanocytic nevi of right lower limb, including hip: Secondary | ICD-10-CM | POA: Diagnosis not present

## 2020-11-01 DIAGNOSIS — D1801 Hemangioma of skin and subcutaneous tissue: Secondary | ICD-10-CM | POA: Diagnosis not present

## 2020-12-03 ENCOUNTER — Other Ambulatory Visit: Payer: Self-pay | Admitting: Cardiology

## 2020-12-12 DIAGNOSIS — M5137 Other intervertebral disc degeneration, lumbosacral region: Secondary | ICD-10-CM | POA: Diagnosis not present

## 2020-12-12 DIAGNOSIS — M4186 Other forms of scoliosis, lumbar region: Secondary | ICD-10-CM | POA: Diagnosis not present

## 2020-12-12 DIAGNOSIS — M5417 Radiculopathy, lumbosacral region: Secondary | ICD-10-CM | POA: Diagnosis not present

## 2020-12-12 DIAGNOSIS — M47816 Spondylosis without myelopathy or radiculopathy, lumbar region: Secondary | ICD-10-CM | POA: Diagnosis not present

## 2020-12-12 DIAGNOSIS — M79609 Pain in unspecified limb: Secondary | ICD-10-CM | POA: Diagnosis not present

## 2020-12-12 DIAGNOSIS — M5136 Other intervertebral disc degeneration, lumbar region: Secondary | ICD-10-CM | POA: Diagnosis not present

## 2020-12-12 DIAGNOSIS — G894 Chronic pain syndrome: Secondary | ICD-10-CM | POA: Diagnosis not present

## 2020-12-12 DIAGNOSIS — G90521 Complex regional pain syndrome I of right lower limb: Secondary | ICD-10-CM | POA: Diagnosis not present

## 2020-12-12 DIAGNOSIS — M419 Scoliosis, unspecified: Secondary | ICD-10-CM | POA: Diagnosis not present

## 2020-12-12 DIAGNOSIS — M48062 Spinal stenosis, lumbar region with neurogenic claudication: Secondary | ICD-10-CM | POA: Diagnosis not present

## 2021-01-09 DIAGNOSIS — M5136 Other intervertebral disc degeneration, lumbar region: Secondary | ICD-10-CM | POA: Diagnosis not present

## 2021-01-09 DIAGNOSIS — M47816 Spondylosis without myelopathy or radiculopathy, lumbar region: Secondary | ICD-10-CM | POA: Diagnosis not present

## 2021-01-09 DIAGNOSIS — M48062 Spinal stenosis, lumbar region with neurogenic claudication: Secondary | ICD-10-CM | POA: Diagnosis not present

## 2021-01-09 DIAGNOSIS — M532X6 Spinal instabilities, lumbar region: Secondary | ICD-10-CM | POA: Diagnosis not present

## 2021-01-21 DIAGNOSIS — R051 Acute cough: Secondary | ICD-10-CM | POA: Diagnosis not present

## 2021-01-21 DIAGNOSIS — J988 Other specified respiratory disorders: Secondary | ICD-10-CM | POA: Diagnosis not present

## 2021-01-21 DIAGNOSIS — Z03818 Encounter for observation for suspected exposure to other biological agents ruled out: Secondary | ICD-10-CM | POA: Diagnosis not present

## 2021-01-21 DIAGNOSIS — B9789 Other viral agents as the cause of diseases classified elsewhere: Secondary | ICD-10-CM | POA: Diagnosis not present

## 2021-01-28 ENCOUNTER — Other Ambulatory Visit: Payer: Self-pay

## 2021-01-28 ENCOUNTER — Ambulatory Visit: Payer: PPO | Admitting: Cardiology

## 2021-01-28 ENCOUNTER — Encounter: Payer: Self-pay | Admitting: Cardiology

## 2021-01-28 VITALS — BP 112/62 | HR 69 | Ht 59.0 in | Wt 141.0 lb

## 2021-01-28 DIAGNOSIS — I479 Paroxysmal tachycardia, unspecified: Secondary | ICD-10-CM | POA: Diagnosis not present

## 2021-01-28 NOTE — Progress Notes (Signed)
Primary Care Provider: Cari Caraway, MD Cardiologist: Glenetta Hew, MD Electrophysiologist: None  Clinic Note: " I feel good."  No chief complaint on file.   ===================================  ASSESSMENT/PLAN   Problem List Items Addressed This Visit       Cardiology Problems   Paroxysmal tachycardia, unspecified ([place]) - Primary (Chronic)   Relevant Orders   EKG 12-Lead (Completed)    ===================================  HPI:    Holly Mcconnell is a 75 y.o. female with a PMH notable for h/o HTN & Cerebral Palsy with Cardiac Sx of palpitations/short atrial runs who presents today for follow up visit.  Holly Mcconnell was last seen on 01/10/2020.  She was 2 months s/p spinal cord stimulator.  Back pain was notably improved.  No notable palpitations or other cardiac symptoms.  Had one fast heart rate episode lasting about a minute.  Looking forward to getting back to the gym.   -> Continued on nadolol.  Recent Hospitalizations: None  Reviewed  CV studies:    The following studies were reviewed today: (if available, images/films reviewed: From Epic Chart or Care Everywhere) None   Interval History:   Holly Mcconnell is a 75 year old female with a past medical history of cerebral palsy and chronic paroxysmal tachycardia. She reports that she is doing well and denies any concerns today. Denies chest pain, dyspnea, palpitations, dizziness, headaches, vision changes and syncopal episodes. She stays very active throughout the day, ambulates with assistance of her walker. She goes to the gym 5 times a week and also goes to the [employer] multiple times a week. She is taking the nadolol and tolerating medication well.   CV Review of Symptoms (Summary) Cardiovascular ROS: negative for - chest pain, dyspnea on exertion, edema, murmur, orthopnea, palpitations, rapid heart rate, or shortness of breath  REVIEWED OF SYSTEMS   Review of Systems  Eyes:  Negative  for blurred vision and double vision.  Respiratory:  Negative for cough, shortness of breath and wheezing.   Cardiovascular:  Negative for chest pain, palpitations, orthopnea and leg swelling.  Gastrointestinal:  Negative for nausea and vomiting.  Neurological:  Negative for dizziness, weakness and headaches.   I have reviewed and (if needed) personally updated the patient's problem list, medications, allergies, past medical and surgical history, social and family history.   PAST MEDICAL HISTORY   Past Medical History:  Diagnosis Date   Cerebral palsy (HCC)    Mild symptoms.   Migraine headache     PAST SURGICAL HISTORY   Past Surgical History:  Procedure Laterality Date   CHOLECYSTECTOMY  1989   TEMPOROMANDIBULAR JOINT ARTHROPLASTY     Thyroid nodule resection  2005   TOTAL ABDOMINAL HYSTERECTOMY W/ BILATERAL SALPINGOOPHORECTOMY  1982    Immunization History  Administered Date(s) Administered   Influenza Split 06/07/2012, 11/30/2013, 12/16/2016, 12/16/2018, 01/09/2020   Influenza, High Dose Seasonal PF 12/01/2017   Influenza,inj,Quad PF,6+ Mos 01/02/2016   Influenza,inj,quad, With Preservative 12/21/2014   Influenza-Unspecified 12/16/2018   PFIZER(Purple Top)SARS-COV-2 Vaccination 06/20/2019, 07/07/2019, 01/09/2020   Pneumococcal Conjugate-13 05/27/2013   Pneumococcal Polysaccharide-23 09/25/2004, 10/22/2011, 02/10/2018   Td 12/18/1999   Tdap 11/29/2012   Zoster Recombinat (Shingrix) 02/26/2017   Zoster, Live 03/11/2006, 12/26/2016, 06/26/2017    MEDICATIONS/ALLERGIES   Current Meds  Medication Sig   acetaminophen (TYLENOL) 650 MG CR tablet 1 dose as needed   aspirin 81 MG chewable tablet Chew by mouth daily.   atorvastatin (LIPITOR) 20 MG tablet 1 tablet   calcium-vitamin  D (OSCAL WITH D) 500-200 MG-UNIT tablet Take 1 tablet by mouth.   ibandronate (BONIVA) 150 MG tablet 1 tablet   Krill Oil 500 MG CAPS Take by mouth.   levothyroxine (SYNTHROID, LEVOTHROID) 75  MCG tablet TK 1 T PO QD IN THE MORNING OES   MAGNESIUM PO Take by mouth daily.   Multiple Vitamins-Minerals (MULTIVITAMIN ADULT PO) Take by mouth.   nadolol (CORGARD) 20 MG tablet TAKE 1 TABLET(20 MG) BY MOUTH AT BEDTIME   SUMAtriptan (IMITREX) 100 MG tablet Take 100 mg by mouth every 2 (two) hours as needed for migraine. May repeat in 2 hours if headache persists or recurs.    Allergies  Allergen Reactions   Amoxicillin Diarrhea   Nabumetone Diarrhea and Nausea And Vomiting   Other Other (See Comments)   Sulfa Antibiotics Diarrhea and Nausea And Vomiting   Codeine Diarrhea    SOCIAL HISTORY/FAMILY HISTORY   Reviewed in Epic:  Pertinent findings:  Social History   Tobacco Use   Smoking status: Never   Smokeless tobacco: Never  Substance Use Topics   Alcohol use: Never   Drug use: Never   Social History   Social History Narrative   She is retired.  Formerly worked in Insurance underwriter.  She is married (husband's name is Holly Mcconnell -married in January 03, 1967).   They have 1 child and 8 biologic grandkids and one adopted grandkid.  She lives with her husband and is very active.     She does cardio-strength exercises with a trainer roughly 5 days a week for 30+ minutes at a time.      She walks with a walker and has some mild speech deficits and musculoskeletal deficits from history of cerebral palsy syndrome. -- >  Does have a handicap placard       OBJCTIVE -PE, EKG, labs   Wt Readings from Last 3 Encounters:  01/28/21 141 lb (64 kg)  01/10/20 135 lb 9.6 oz (61.5 kg)  07/20/19 128 lb (58.1 kg)    Physical Exam: BP 112/62   Pulse 69   Ht 4\' 11"  (1.499 m)   Wt 141 lb (64 kg)   SpO2 97%   BMI 28.48 kg/m  Physical Exam Vitals reviewed.  Constitutional:      General: She is not in acute distress.    Appearance: Normal appearance. She is normal weight. She is not ill-appearing or toxic-appearing.  HENT:     Head: Normocephalic and atraumatic.  Cardiovascular:     Rate and  Rhythm: Normal rate and regular rhythm.     Pulses: Normal pulses.     Heart sounds: Normal heart sounds.    No friction rub. No gallop.  Pulmonary:     Effort: Pulmonary effort is normal. No respiratory distress.     Breath sounds: Normal breath sounds. No wheezing or rhonchi.  Musculoskeletal:     Right lower leg: No edema.     Left lower leg: No edema.  Skin:    General: Skin is warm and dry.     Capillary Refill: Capillary refill takes less than 2 seconds.  Neurological:     Mental Status: She is alert.   -Significant kyphoscoliosis   Adult ECG Report  Rate: 69 ;  Rhythm: normal sinus rhythm;   Narrative Interpretation: Normal sinus rhythm with sinus arrhythmia with possible left atrial enlargement  --  personally reviewed - signed.   Recent Labs:    No results found for: CHOL, HDL, LDLCALC, LDLDIRECT, TRIG,  CHOLHDL No results found for: CREATININE, BUN, NA, K, CL, CO2 No flowsheet data found.  No results found for: HGBA1C No results found for: TSH  ==================================================  COVID-19 Education: The signs and symptoms of COVID-19 were discussed with the patient and how to seek care for testing (follow up with PCP or arrange E-visit).    I spent a total of 15 minutes with the patient spent in direct patient consultation. 3 MIN for Attending evaluation.  Additional time spent with chart review  / charting (studies, outside notes, etc): 5 min, 4 min for Attending Attestation.  Total Time: 20 min  Current medicines are reviewed at length with the patient today.  (+/- concerns) - n/a  This visit occurred during the SARS-CoV-2 public health emergency.  Safety protocols were in place, including screening questions prior to the visit, additional usage of staff PPE, and extensive cleaning of exam room while observing appropriate contact time as indicated for disinfecting solutions.  Notice: This dictation was prepared with Dragon dictation along with  smart phrase technology. Any transcriptional errors that result from this process are unintentional and may not be corrected upon review.  Studies Ordered:   Orders Placed This Encounter  Procedures   EKG 12-Lead     Patient Instructions / Medication Changes & Studies & Tests Ordered   Patient Instructions  Medication Instructions:   No changes  *If you need a refill on your cardiac medications before your next appointment, please call your pharmacy*   Lab Work: Not needed    Testing/Procedures: Not needed   Follow-Up: At [facility], you and your health needs are our priority.  As part of our continuing mission to provide you with exceptional heart care, we have created designated Provider Care Teams.  These Care Teams include your primary Cardiologist (physician) and Advanced Practice Providers (APPs -  Physician Assistants and Nurse Practitioners) who all work together to provide you with the care you need, when you need it.  We recommend signing up for the patient portal called "MyChart".  Sign up information is provided on this After Visit Summary.  MyChart is used to connect with patients for Virtual Visits (Telemedicine).  Patients are able to view lab/test results, encounter notes, upcoming appointments, etc.  Non-urgent messages can be sent to your provider as well.   To learn more about what you can do with MyChart, go to [email or web address].    Your next appointment:   12 month(s)  The format for your next appointment:   In Person  Provider:   Glenetta Hew, MD { If MD is not listed, click here to update      Signed,     Donney Dice, DO   R2, Fam Med   ATTENDING ATTESTATION  I have seen, examined and evaluated the patient along with the Resident Physician in clinic today.  I personally performed my own interview & exanimation.  After reviewing all the available data and chart, we discussed the patients laboratory, study & physical findings as  well as symptoms in detail. I agree with her findings, examination as well as impression recommendations as per our discussion.    Attending adjustments int the full clinic noted annotated in [place].     Glenetta Hew, M.D., M.S. Interventional Cardiologist   Pager # [phone] Phone # [phone] [address] [place], [place] [address]   Thank you for choosing Heartcare at [facility]!!

## 2021-01-28 NOTE — Patient Instructions (Signed)
Medication Instructions:   No changes  *If you need a refill on your cardiac medications before your next appointment, please call your pharmacy*   Lab Work: Not needed    Testing/Procedures: Not needed   Follow-Up: At [facility], you and your health needs are our priority.  As part of our continuing mission to provide you with exceptional heart care, we have created designated Provider Care Teams.  These Care Teams include your primary Cardiologist (physician) and Advanced Practice Providers (APPs -  Physician Assistants and Nurse Practitioners) who all work together to provide you with the care you need, when you need it.  We recommend signing up for the patient portal called "MyChart".  Sign up information is provided on this After Visit Summary.  MyChart is used to connect with patients for Virtual Visits (Telemedicine).  Patients are able to view lab/test results, encounter notes, upcoming appointments, etc.  Non-urgent messages can be sent to your provider as well.   To learn more about what you can do with MyChart, go to [email or web address].    Your next appointment:   12 month(s)  The format for your next appointment:   In Person  Provider:   Glenetta Hew, MD { If MD is not listed, click here to update

## 2021-02-08 DIAGNOSIS — M47816 Spondylosis without myelopathy or radiculopathy, lumbar region: Secondary | ICD-10-CM | POA: Diagnosis not present

## 2021-02-20 NOTE — Assessment & Plan Note (Signed)
No new complaints.  No further episodes on current dose of nadolol.  -chronic and stable -continue nadolol daily  Follow up in 1 year.

## 2021-02-20 NOTE — Progress Notes (Signed)
° ° °  ATTENDING ATTESTATION  I have seen, examined and evaluated the patient along with the Resident Physician - Donney Dice, DO - in clinic today.  I personally performed my own interview & exanimation.  After reviewing all the available data and chart, we discussed the patients laboratory, study & physical findings as well as symptoms in detail. I agree with her findings, examination as well as impression recommendations as per our discussion.    Attending adjustments int the full clinic noted annotated in [place].   Paroxysmal tachycardia, unspecified (HCC) No new complaints.  No further episodes on current dose of nadolol.  -chronic and stable -continue nadolol daily  Follow up in 1 year.      Glenetta Hew, M.D., M.S. Interventional Cardiologist   Pager # [phone] Phone # [phone] [address] [place], [place] [address]

## 2021-02-26 DIAGNOSIS — J069 Acute upper respiratory infection, unspecified: Secondary | ICD-10-CM | POA: Diagnosis not present

## 2021-02-26 DIAGNOSIS — J209 Acute bronchitis, unspecified: Secondary | ICD-10-CM | POA: Diagnosis not present

## 2021-03-26 DIAGNOSIS — M47816 Spondylosis without myelopathy or radiculopathy, lumbar region: Secondary | ICD-10-CM | POA: Diagnosis not present

## 2021-04-15 DIAGNOSIS — M85852 Other specified disorders of bone density and structure, left thigh: Secondary | ICD-10-CM | POA: Diagnosis not present

## 2021-04-15 DIAGNOSIS — E039 Hypothyroidism, unspecified: Secondary | ICD-10-CM | POA: Diagnosis not present

## 2021-04-15 DIAGNOSIS — E782 Mixed hyperlipidemia: Secondary | ICD-10-CM | POA: Diagnosis not present

## 2021-04-19 DIAGNOSIS — Z0000 Encounter for general adult medical examination without abnormal findings: Secondary | ICD-10-CM | POA: Diagnosis not present

## 2021-04-19 DIAGNOSIS — R002 Palpitations: Secondary | ICD-10-CM | POA: Diagnosis not present

## 2021-04-19 DIAGNOSIS — G43909 Migraine, unspecified, not intractable, without status migrainosus: Secondary | ICD-10-CM | POA: Diagnosis not present

## 2021-04-19 DIAGNOSIS — Z1389 Encounter for screening for other disorder: Secondary | ICD-10-CM | POA: Diagnosis not present

## 2021-04-19 DIAGNOSIS — Z79899 Other long term (current) drug therapy: Secondary | ICD-10-CM | POA: Diagnosis not present

## 2021-04-19 DIAGNOSIS — E782 Mixed hyperlipidemia: Secondary | ICD-10-CM | POA: Diagnosis not present

## 2021-04-19 DIAGNOSIS — M85852 Other specified disorders of bone density and structure, left thigh: Secondary | ICD-10-CM | POA: Diagnosis not present

## 2021-04-19 DIAGNOSIS — N898 Other specified noninflammatory disorders of vagina: Secondary | ICD-10-CM | POA: Diagnosis not present

## 2021-04-19 DIAGNOSIS — M47816 Spondylosis without myelopathy or radiculopathy, lumbar region: Secondary | ICD-10-CM | POA: Diagnosis not present

## 2021-04-19 DIAGNOSIS — B351 Tinea unguium: Secondary | ICD-10-CM | POA: Diagnosis not present

## 2021-04-19 DIAGNOSIS — E039 Hypothyroidism, unspecified: Secondary | ICD-10-CM | POA: Diagnosis not present

## 2021-05-07 DIAGNOSIS — M47816 Spondylosis without myelopathy or radiculopathy, lumbar region: Secondary | ICD-10-CM | POA: Diagnosis not present

## 2021-05-13 DIAGNOSIS — Z79899 Other long term (current) drug therapy: Secondary | ICD-10-CM | POA: Diagnosis not present

## 2021-05-28 ENCOUNTER — Other Ambulatory Visit: Payer: Self-pay | Admitting: Cardiology

## 2021-06-11 DIAGNOSIS — M48062 Spinal stenosis, lumbar region with neurogenic claudication: Secondary | ICD-10-CM | POA: Diagnosis not present

## 2021-06-11 DIAGNOSIS — M5136 Other intervertebral disc degeneration, lumbar region: Secondary | ICD-10-CM | POA: Diagnosis not present

## 2021-06-11 DIAGNOSIS — G90521 Complex regional pain syndrome I of right lower limb: Secondary | ICD-10-CM | POA: Diagnosis not present

## 2021-06-11 DIAGNOSIS — M532X6 Spinal instabilities, lumbar region: Secondary | ICD-10-CM | POA: Diagnosis not present

## 2021-06-11 DIAGNOSIS — M79609 Pain in unspecified limb: Secondary | ICD-10-CM | POA: Diagnosis not present

## 2021-06-11 DIAGNOSIS — G894 Chronic pain syndrome: Secondary | ICD-10-CM | POA: Diagnosis not present

## 2021-06-11 DIAGNOSIS — M47816 Spondylosis without myelopathy or radiculopathy, lumbar region: Secondary | ICD-10-CM | POA: Diagnosis not present

## 2021-06-28 ENCOUNTER — Other Ambulatory Visit: Payer: Self-pay | Admitting: Family Medicine

## 2021-06-28 DIAGNOSIS — M85852 Other specified disorders of bone density and structure, left thigh: Secondary | ICD-10-CM

## 2021-07-03 ENCOUNTER — Other Ambulatory Visit: Payer: Self-pay | Admitting: Family Medicine

## 2021-07-03 DIAGNOSIS — Z1231 Encounter for screening mammogram for malignant neoplasm of breast: Secondary | ICD-10-CM

## 2021-07-16 DIAGNOSIS — R2689 Other abnormalities of gait and mobility: Secondary | ICD-10-CM | POA: Diagnosis not present

## 2021-07-16 DIAGNOSIS — E039 Hypothyroidism, unspecified: Secondary | ICD-10-CM | POA: Diagnosis not present

## 2021-07-16 DIAGNOSIS — Z1331 Encounter for screening for depression: Secondary | ICD-10-CM | POA: Diagnosis not present

## 2021-07-16 DIAGNOSIS — E7849 Other hyperlipidemia: Secondary | ICD-10-CM | POA: Diagnosis not present

## 2021-07-16 DIAGNOSIS — R0602 Shortness of breath: Secondary | ICD-10-CM | POA: Diagnosis not present

## 2021-07-16 DIAGNOSIS — R5383 Other fatigue: Secondary | ICD-10-CM | POA: Diagnosis not present

## 2021-07-16 DIAGNOSIS — E663 Overweight: Secondary | ICD-10-CM | POA: Diagnosis not present

## 2021-07-16 DIAGNOSIS — R002 Palpitations: Secondary | ICD-10-CM | POA: Diagnosis not present

## 2021-07-16 DIAGNOSIS — M8580 Other specified disorders of bone density and structure, unspecified site: Secondary | ICD-10-CM | POA: Diagnosis not present

## 2021-07-16 DIAGNOSIS — G809 Cerebral palsy, unspecified: Secondary | ICD-10-CM | POA: Diagnosis not present

## 2021-07-30 ENCOUNTER — Encounter: Payer: Self-pay | Admitting: Cardiology

## 2021-07-30 DIAGNOSIS — E782 Mixed hyperlipidemia: Secondary | ICD-10-CM | POA: Diagnosis not present

## 2021-07-30 DIAGNOSIS — E039 Hypothyroidism, unspecified: Secondary | ICD-10-CM | POA: Diagnosis not present

## 2021-07-30 DIAGNOSIS — Z79899 Other long term (current) drug therapy: Secondary | ICD-10-CM | POA: Diagnosis not present

## 2021-07-30 DIAGNOSIS — R002 Palpitations: Secondary | ICD-10-CM | POA: Diagnosis not present

## 2021-07-31 NOTE — Telephone Encounter (Signed)
I do not know that beta-blockers lower metabolism to the point of gaining weight, but nadolol is only there faster heart rates...  We can start by dropping the dose in half and see if that makes a difference.  There are different beta-blockers, but it is hard to switch without seeing blood pressures etc.  I would be fine if your PCP wants to switch you to a different beta-blocker.  My intention was to use a long-acting beta-blocker.   Glenetta Hew, MD

## 2021-07-31 NOTE — Telephone Encounter (Signed)
Nadolol does not come in '10mg'$ . Called pt and notified that she will still have to cut in 1/2. She states that she will get new pill cutter and try to cut in 1/2 with that or she will call and get replaced by PCP as noted by Dr Roxy Manns may call [facility] to get their recommendation then call PCP to rx/discuss what to rx next). She will try to cut in 1/2 first and then go from there.

## 2021-08-16 DIAGNOSIS — R12 Heartburn: Secondary | ICD-10-CM | POA: Diagnosis not present

## 2021-08-16 DIAGNOSIS — G4719 Other hypersomnia: Secondary | ICD-10-CM | POA: Diagnosis not present

## 2021-08-16 DIAGNOSIS — R002 Palpitations: Secondary | ICD-10-CM | POA: Diagnosis not present

## 2021-08-21 DIAGNOSIS — E663 Overweight: Secondary | ICD-10-CM | POA: Diagnosis not present

## 2021-08-21 DIAGNOSIS — E039 Hypothyroidism, unspecified: Secondary | ICD-10-CM | POA: Diagnosis not present

## 2021-08-21 DIAGNOSIS — G4719 Other hypersomnia: Secondary | ICD-10-CM | POA: Diagnosis not present

## 2021-08-21 DIAGNOSIS — E782 Mixed hyperlipidemia: Secondary | ICD-10-CM | POA: Diagnosis not present

## 2021-08-21 DIAGNOSIS — R000 Tachycardia, unspecified: Secondary | ICD-10-CM | POA: Diagnosis not present

## 2021-09-10 DIAGNOSIS — G809 Cerebral palsy, unspecified: Secondary | ICD-10-CM | POA: Diagnosis not present

## 2021-09-10 DIAGNOSIS — E782 Mixed hyperlipidemia: Secondary | ICD-10-CM | POA: Diagnosis not present

## 2021-09-10 DIAGNOSIS — E039 Hypothyroidism, unspecified: Secondary | ICD-10-CM | POA: Diagnosis not present

## 2021-09-10 DIAGNOSIS — E663 Overweight: Secondary | ICD-10-CM | POA: Diagnosis not present

## 2021-09-11 DIAGNOSIS — M5136 Other intervertebral disc degeneration, lumbar region: Secondary | ICD-10-CM | POA: Diagnosis not present

## 2021-09-11 DIAGNOSIS — M48062 Spinal stenosis, lumbar region with neurogenic claudication: Secondary | ICD-10-CM | POA: Diagnosis not present

## 2021-09-11 DIAGNOSIS — M47816 Spondylosis without myelopathy or radiculopathy, lumbar region: Secondary | ICD-10-CM | POA: Diagnosis not present

## 2021-10-07 NOTE — Therapy (Incomplete)
OUTPATIENT PHYSICAL THERAPY LOWER EXTREMITY EVALUATION   Patient Name: Holly Mcconnell MRN: [id number] DOB:December 08, 1945, 76 y.o., female Today's Date: 10/08/2021   PT End of Session - 10/08/21 0902     Visit Number 1    Number of Visits 16    Date for PT Re-Evaluation 12/03/21    Authorization Type [employer]    PT Start Time 0900    PT Stop Time 0940    PT Time Calculation (min) 40 min    Activity Tolerance Patient tolerated treatment well    Behavior During Therapy WFL for tasks assessed/performed             Past Medical History:  Diagnosis Date   Cerebral palsy (HCC)    Mild symptoms.   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   Radiculopathy due to lumbar intervertebral disc disorder 03/01/2018   Migraine headache    High cholesterol 06/11/2003    PCP: Cari Caraway, MD  REFERRING PROVIDER: Cari Caraway, MD  REFERRING DIAG:  G80.9 (ICD-10-CM) - Cerebral palsy, unspecified  R26.89 (ICD-10-CM) - Other abnormalities of gait and mobility    THERAPY DIAG:  Muscle weakness (generalized)  Abnormality of gait and mobility  Unsteadiness on feet  Rationale for Evaluation and Treatment Rehabilitation  ONSET DATE: 3 months  SUBJECTIVE:   SUBJECTIVE STATEMENT: Spinal fusion L4-5 with a nerve block July 9 [facility]. Radiating pain to right foot after 10 minutes of standing. CP with right  sided weakness, oa right knee, CP effecting wrist/hand > right. Hs remained active throughout life.  Worked with trainer up until a few months ago.  PERTINENT HISTORY: Spinal stenosis of lumbar region with neurogenic claudication   Spondylosis of lumbar    Chronic pain syndrome CP    PAIN:  Are you having pain? Yes: NPRS scale: Current 8/10, least 3/10, max 9/10 Pain location: Across LB with radiation into rle to hip constantly, to foot Pain description: radiation/constant with standing lb Aggravating factors: standing walking Relieving factors: sitting/lying down  PRECAUTIONS: Fall; no more than 5 lb lifting  WEIGHT BEARING RESTRICTIONS No  FALLS:  Has patient fallen in last 6 months? No  LIVING ENVIRONMENT: Lives with: lives with their family Lives in: House/apartment Stairs: No Has following equipment at home: Grab bars and rollator  OCCUPATION: Retired  PLOF: Independent with community mobility with device  PATIENT GOALS Pt wants to be able to complete house hold chores, drive   OBJECTIVE:   DIAGNOSTIC FINDINGS: none in chart  PATIENT SURVEYS:  FOTO Risk adjusted: 48 with goal 55  COGNITION:  Overall cognitive status: Within functional limits for tasks assessed     SENSATION: WFL    MUSCLE LENGTH: Hamstrings: Right WFL deg; Left 60 deg   POSTURE:  Right lean; left shoulder/hip higher than right; cervical spine shifted right, forward shoulders  PALPATION: Slight discomfort right sided lumbar spine   LOWER EXTREMITY ROM:   WFL   LOWER EXTREMITY MMT:  MMT  Right eval Left eval  Hip flexion 3 3  Hip extension    Hip abduction 4 4  Hip adduction 5 5  Hip internal rotation    Hip external rotation    Knee flexion 4 4  Knee extension 4 4  Ankle dorsiflexion    Ankle plantarflexion    Ankle inversion    Ankle eversion     (Blank rows = not tested)  LOWER EXTREMITY SPECIAL TESTS:  Slump test neg R/L SLR test: + RLE @ 60d  FUNCTIONAL  TESTS:  5 times sit to stand: 19.50 Timed up and go (TUG): 23.07 Berg Balance Scale: 26/56  GAIT: Distance walked: 500 Assistive device utilized: Environmental consultant - 4 wheeled Level of assistance: Complete [place] Comments: forward flexed position, increased cadence    TODAY'S TREATMENT: Eval Objective testing Edu on management/anatomy of condition Exercises: PPT; Clamshells   PATIENT EDUCATION:  Education details: Geophysicist/field seismologist of condition/ properties of water/ benefit of aquatic therapy Person educated: Patient Education method: Explanation Education comprehension: verbalized understanding   HOME EXERCISE PROGRAM: [id number] Will add aquatics  ASSESSMENT:  CLINICAL IMPRESSION: Patient is a 76 y.o. F who was seen today for physical therapy evaluation and treatment for CP and abnormality of gait. She reports she recently had a lumber fusion ~ 1 month ago at [facility] without any relief in LB pain symptoms which does radiate into rle.  She has an extensive medical hx including CP (affecting her right side), spinal dysfunction, and chronic pain syndrome.  She is an active 76 yo and pushes through pain as she has had physical challenges since birth. Main complains are pain, lack of balance and the inability to perform her household duties,  She claims she has a high pain tolerance.  She is a good candidate for skilled aquatic and on land based physical therapy to improve all functional mobility, decrease fall risk and return pt to desired activities.   OBJECTIVE IMPAIRMENTS Abnormal gait, decreased activity tolerance, decreased balance, decreased coordination, decreased endurance, decreased mobility, difficulty walking, decreased ROM, decreased strength, postural dysfunction, and pain.   ACTIVITY LIMITATIONS carrying, lifting, bending, stairs, transfers, and dressing  PARTICIPATION LIMITATIONS: meal prep, cleaning, laundry, driving, shopping, community activity, and yard work  PERSONAL  FACTORS Age, Past/current experiences, and 1-2 comorbidities: CP, OA, Lumbar stenosis  are also affecting patient's functional outcome.   REHAB POTENTIAL: Good  CLINICAL DECISION MAKING: Evolving/moderate complexity  EVALUATION COMPLEXITY: Moderate   GOALS: Goals reviewed with patient? Yes  SHORT TERM GOALS: Target date: 10/29/2021  Max pain to decrease to <6/10 with centralization of sx. Baseline:8/10 Goal status: INITIAL  2.  Pt to tolerate standing and walking up towards 15 minutes Baseline: 10 min Goal status: INITIAL  3.  Pt will improve on the TUG to <or= 15s  to demonstrate improved balance and amb ability. Baseline: 23.07 - 19.50 Goal status: INITIAL  4.  Pt will improve on 5 x STS to <or=15s to demonstrate improved  strength Baseline: 19.50 Goal status: INITIAL  5.  Pt will return to driving to demonstrate return to PLOF Baseline:  Goal status: INITIAL    LONG TERM GOALS: Target date: 12/03/2021   Foto to improve to stated goal Baseline: 48 Goal status: INITIAL  2.  Pt to improve Berg to >or= 45/56 to demonstrate decreased fall risk. Baseline: 26/56 Goal status: INITIAL  3.  Pt to report no increase in pain sx with ambulation up towards 30 minutes Baseline: 8/10 Goal status: INITIAL  4.  Pt to return to house hold chores  with management of pain to demonstrate return to PLOF Baseline: unable to complete Goal status: INITIAL    PLAN: PT FREQUENCY: 1-2x/week  PT DURATION: 8 weeks  PLANNED INTERVENTIONS: Therapeutic exercises, Therapeutic activity, Neuromuscular re-education, Balance training, Gait training, Patient/Family education, Self Care, Joint mobilization, Joint manipulation, Stair training, Aquatic Therapy, Dry Needling, Spinal manipulation, Spinal mobilization, Cryotherapy, Moist heat, Taping, Ultrasound, and Manual therapy  PLAN FOR NEXT SESSION: Aquatics for stretching/strengthening of core, balance retraining   Denton Meek, PT  MPT 10/08/2021, 1:35 PM  Holly Mcconnell MPT 10/08/21 700p

## 2021-10-08 ENCOUNTER — Ambulatory Visit (HOSPITAL_BASED_OUTPATIENT_CLINIC_OR_DEPARTMENT_OTHER): Payer: PPO | Attending: Family Medicine | Admitting: Physical Therapy

## 2021-10-08 ENCOUNTER — Encounter (HOSPITAL_BASED_OUTPATIENT_CLINIC_OR_DEPARTMENT_OTHER): Payer: Self-pay | Admitting: Physical Therapy

## 2021-10-08 DIAGNOSIS — G809 Cerebral palsy, unspecified: Secondary | ICD-10-CM | POA: Insufficient documentation

## 2021-10-08 DIAGNOSIS — R2681 Unsteadiness on feet: Secondary | ICD-10-CM | POA: Diagnosis not present

## 2021-10-08 DIAGNOSIS — R2689 Other abnormalities of gait and mobility: Secondary | ICD-10-CM | POA: Diagnosis not present

## 2021-10-08 DIAGNOSIS — R269 Unspecified abnormalities of gait and mobility: Secondary | ICD-10-CM | POA: Diagnosis not present

## 2021-10-08 DIAGNOSIS — M6281 Muscle weakness (generalized): Secondary | ICD-10-CM | POA: Diagnosis not present

## 2021-10-09 DIAGNOSIS — E782 Mixed hyperlipidemia: Secondary | ICD-10-CM | POA: Diagnosis not present

## 2021-10-09 DIAGNOSIS — E663 Overweight: Secondary | ICD-10-CM | POA: Diagnosis not present

## 2021-10-09 DIAGNOSIS — G809 Cerebral palsy, unspecified: Secondary | ICD-10-CM | POA: Diagnosis not present

## 2021-10-10 ENCOUNTER — Encounter (HOSPITAL_BASED_OUTPATIENT_CLINIC_OR_DEPARTMENT_OTHER): Payer: Self-pay | Admitting: Physical Therapy

## 2021-10-10 ENCOUNTER — Ambulatory Visit (HOSPITAL_BASED_OUTPATIENT_CLINIC_OR_DEPARTMENT_OTHER): Payer: PPO | Admitting: Physical Therapy

## 2021-10-10 DIAGNOSIS — G809 Cerebral palsy, unspecified: Secondary | ICD-10-CM | POA: Diagnosis not present

## 2021-10-10 DIAGNOSIS — R2681 Unsteadiness on feet: Secondary | ICD-10-CM

## 2021-10-10 DIAGNOSIS — M6281 Muscle weakness (generalized): Secondary | ICD-10-CM

## 2021-10-10 DIAGNOSIS — R269 Unspecified abnormalities of gait and mobility: Secondary | ICD-10-CM

## 2021-10-10 NOTE — Therapy (Signed)
OUTPATIENT PHYSICAL THERAPY LOWER EXTREMITY EVALUATION   Patient Name: Holly Mcconnell MRN: [id number] DOB:04-19-1945, 76 y.o., female Today's Date: 10/10/2021   PT End of Session - 10/10/21 1033     Visit Number 2    Number of Visits 16    Date for PT Re-Evaluation 12/03/21    Authorization Type [employer]    PT Start Time 1032    PT Stop Time 1115    PT Time Calculation (min) 43 min             Past Medical History:  Diagnosis Date   Cerebral palsy ([place])    Mild symptoms.   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   Paroxysmal tachycardia, unspecified ([place]) 03/17/2018   Cerebral palsy ([place]) 03/01/2018   Lumbar radiculopathy 03/01/2018   Spinal stenosis of lumbar region with neurogenic claudication 03/01/2018   Radiculopathy due to lumbar intervertebral disc disorder 03/01/2018   Migraine headache    High cholesterol 06/11/2003    PCP: Cari Caraway, MD  REFERRING PROVIDER: Cari Caraway, MD  REFERRING DIAG:  G80.9 (ICD-10-CM) - Cerebral palsy, unspecified  R26.89 (ICD-10-CM) - Other abnormalities of gait and mobility    THERAPY DIAG:  Muscle weakness (generalized)  Abnormality of gait and mobility  Unsteadiness on feet  Rationale for Evaluation and Treatment Rehabilitation  ONSET DATE: 3 months  SUBJECTIVE:    Current subjective:  "A little nervous about getting into pool" SUBJECTIVE STATEMENT: Spinal fusion L4-5 with a nerve block July 9 [facility]. Radiating pain to right foot after 10 minutes of standing. CP with right sided weakness, oa right knee, CP effecting  wrist/hand > right. Hs remained active throughout life.  Worked with trainer up until a few months ago.  PERTINENT HISTORY: Spinal stenosis of lumbar region with neurogenic claudication   Spondylosis of lumbar    Chronic pain syndrome CP    PAIN:  Are you having pain? Yes: NPRS scale: Current 7/10, least 3/10, max 9/10 Pain location: Across LB with radiation into rle to hip constantly, to foot Pain description: radiation/constant with standing lb Aggravating factors: standing walking Relieving factors: sitting/lying down  PRECAUTIONS: Fall; no more than 5 lb lifting  WEIGHT BEARING RESTRICTIONS No  FALLS:  Has patient fallen in last 6 months? No  LIVING ENVIRONMENT: Lives with: lives with their family Lives in: House/apartment Stairs: No Has following equipment at home: Grab bars and rollator  OCCUPATION: Retired  PLOF: Independent with community mobility with device  PATIENT GOALS Pt wants to be able to complete house hold chores, drive   OBJECTIVE:   DIAGNOSTIC FINDINGS: none in chart  PATIENT SURVEYS:  FOTO Risk adjusted: 48 with goal 55  COGNITION:  Overall cognitive status: Within functional limits for tasks assessed     SENSATION: WFL    MUSCLE LENGTH: Hamstrings: Right WFL deg; Left 60 deg   POSTURE:  Right lean; left shoulder/hip higher than right; cervical spine shifted right, forward shoulders  PALPATION: Slight discomfort right sided lumbar spine   LOWER EXTREMITY ROM:   WFL   LOWER EXTREMITY MMT:  MMT Right eval Left eval  Hip flexion 3  3  Hip extension    Hip abduction 4 4  Hip adduction 5 5  Hip internal rotation    Hip external rotation    Knee flexion 4 4  Knee extension 4 4  Ankle dorsiflexion    Ankle plantarflexion    Ankle inversion    Ankle eversion     (Blank rows = not tested)  LOWER EXTREMITY SPECIAL TESTS:  Slump test neg R/L SLR test: + RLE @ 60d  FUNCTIONAL TESTS:  5 times sit to stand: 19.50 Timed up  and go (TUG): 23.07 Berg Balance Scale: 26/56  GAIT: Distance walked: 500 Assistive device utilized: Walker - 4 wheeled Level of assistance: Complete Independence Comments: forward flexed position, increased cadence    TODAY'S TREATMENT: Pt seen for aquatic therapy today.  Treatment took place in water 3.25-4.5 ft in depth at the [facility]. Temp of water was 91.  Pt entered/exited the pool via steps using side step pattern with bilat ue on 1 handrail rail.  *Intro to setting *Walking yellow noodle multiple widths, forward/back/side stepping min-cga initially then cga-sup. Cues on last width in ea direction on increased step length for improved control submerged. *Standing holding to wall: df; pf; marching; add/abd; hip extension x 10.  Cues for erect posture. *Stretching: Hamstring, gastroc and lumbar spine 2nd step holding to hand rails 3 x 20-25s hold ea.  Pt requires the buoyancy and hydrostatic pressure of water for support, and to offload joints by unweighting joint load by at least 50 % in navel deep water and by at least 75-80% in chest to neck deep water.  Viscosity of the water is needed for resistance of strengthening. Water current perturbations provides challenge to standing balance requiring increased core activation.    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 Pt with significant apprehension in setting but decreases as session continues.  Requires moderate cues for decreased guarding (in shoulders).  Moderates cues also for core control and balance, walking speed and step length. Pt edu/demonstration on perturbations and viscosity properties.  Focused on easy movement for strengthening and ROM. Gained good stretch in post core overall decreasing LBP to 0/10 upon completion. She tolerates session well.    Patient is a 76 y.o. F who was seen today for physical therapy evaluation and treatment for CP and abnormality of gait. She reports she recently had a lumber fusion ~ 1 month ago at [facility] without any relief in LB pain symptoms which does radiate into rle.  She has an extensive medical hx including CP (affecting her right side), spinal dysfunction, and chronic pain syndrome.  She is an active 76 yo and pushes through pain as she has had physical challenges since birth. Main complains are pain, lack of balance and the inability to perform her household duties,  She claims she has a high pain tolerance.  She is a good candidate for skilled aquatic and on land based physical therapy to improve all functional mobility, decrease fall risk and return pt to desired activities.   OBJECTIVE IMPAIRMENTS Abnormal gait, decreased activity tolerance, decreased balance, decreased coordination, decreased endurance, decreased mobility, difficulty walking, decreased ROM, decreased strength, postural dysfunction, and pain.   ACTIVITY LIMITATIONS carrying, lifting, bending, stairs, transfers, and dressing  PARTICIPATION LIMITATIONS: meal prep, cleaning, laundry, driving, shopping, community activity, and yard work  PERSONAL FACTORS Age, Past/current experiences, and 1-2 comorbidities: CP, OA, Lumbar stenosis  are also affecting patient's functional outcome.   REHAB POTENTIAL: Good  CLINICAL DECISION MAKING: Evolving/moderate complexity  EVALUATION COMPLEXITY: Moderate   GOALS: Goals reviewed with patient? Yes  SHORT TERM GOALS: Target date: 10/31/2021  Max pain to decrease to <6/10 with centralization of sx. Baseline:8/10 Goal status: INITIAL  2.  Pt to tolerate standing and walking up towards 15 minutes Baseline: 10 min Goal status:  INITIAL  3.  Pt will improve on the TUG to <or= 15s  to demonstrate improved balance and amb ability. Baseline: 23.07 - 19.50 Goal status: INITIAL  4.  Pt will improve on 5 x STS to <or=15s to demonstrate improved  strength Baseline: 19.50 Goal status: INITIAL  5.  Pt will return to driving to demonstrate return to PLOF Baseline:  Goal status: INITIAL    LONG TERM GOALS: Target date: 12/05/2021   Foto to improve to stated goal Baseline: 48 Goal status: INITIAL  2.  Pt to improve Berg to >or= 45/56 to demonstrate decreased fall risk. Baseline: 26/56 Goal status: INITIAL  3.  Pt to report no increase in pain sx with ambulation up towards 30 minutes Baseline: 8/10 Goal status: INITIAL  4.  Pt to return to house hold chores  with management of pain to demonstrate return to PLOF Baseline: unable to complete Goal status: INITIAL    PLAN: PT FREQUENCY: 1-2x/week  PT DURATION: 8 weeks  PLANNED INTERVENTIONS: Therapeutic exercises, Therapeutic activity, Neuromuscular re-education, Balance training, Gait training, Patient/Family education, Self Care, Joint mobilization, Joint manipulation, Stair training, Aquatic Therapy, Dry Needling, Spinal manipulation, Spinal mobilization, Cryotherapy, Moist heat, Taping, Ultrasound, and Manual therapy  PLAN FOR NEXT SESSION: Aquatics for stretching/strengthening of core, balance retraining   Denton Meek, PT MPT 10/10/2021, 10:34 AM  Tharon Aquas Obie Kallenbach MPT 10/08/21 700p

## 2021-10-15 ENCOUNTER — Ambulatory Visit (HOSPITAL_BASED_OUTPATIENT_CLINIC_OR_DEPARTMENT_OTHER): Payer: PPO | Admitting: Physical Therapy

## 2021-10-15 ENCOUNTER — Encounter (HOSPITAL_BASED_OUTPATIENT_CLINIC_OR_DEPARTMENT_OTHER): Payer: Self-pay | Admitting: Physical Therapy

## 2021-10-15 DIAGNOSIS — M6281 Muscle weakness (generalized): Secondary | ICD-10-CM

## 2021-10-15 DIAGNOSIS — R269 Unspecified abnormalities of gait and mobility: Secondary | ICD-10-CM

## 2021-10-15 DIAGNOSIS — G809 Cerebral palsy, unspecified: Secondary | ICD-10-CM | POA: Diagnosis not present

## 2021-10-15 DIAGNOSIS — R2681 Unsteadiness on feet: Secondary | ICD-10-CM

## 2021-10-15 NOTE — Therapy (Signed)
OUTPATIENT PHYSICAL THERAPY LOWER EXTREMITY TREATMENT NOTE   Patient Name: Holly Mcconnell MRN: [id number] DOB:11-30-1945, 76 y.o., female Today's Date: 10/15/2021   PT End of Session - 10/15/21 0901     Visit Number 3    Number of Visits 16    Date for PT Re-Evaluation 12/03/21    Authorization Type [employer]    PT Start Time 0900    PT Stop Time 0940    PT Time Calculation (min) 40 min    Activity Tolerance Patient tolerated treatment well    Behavior During Therapy WFL for tasks assessed/performed             Past Medical History:  Diagnosis Date   Cerebral palsy ([place])    Mild symptoms.   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   Paroxysmal tachycardia, unspecified ([place]) 03/17/2018   Cerebral palsy ([place]) 03/01/2018   Lumbar radiculopathy 03/01/2018   Spinal stenosis of lumbar region with neurogenic claudication 03/01/2018   Radiculopathy due to lumbar intervertebral disc disorder 03/01/2018   Migraine headache    High cholesterol 06/11/2003    PCP: Cari Caraway, MD  REFERRING PROVIDER: Cari Caraway, MD  REFERRING DIAG:  G80.9 (ICD-10-CM) - Cerebral palsy, unspecified  R26.89 (ICD-10-CM) - Other abnormalities of gait and mobility    THERAPY DIAG:  Muscle weakness (generalized)  Abnormality of gait and mobility  Unsteadiness on feet  Rationale for Evaluation and Treatment Rehabilitation  ONSET DATE: 3 months  SUBJECTIVE:     SUBJECTIVE STATEMENT: Pt reports that she was sore after last session.  She is accompanied by her husband.    PERTINENT HISTORY: Spinal stenosis of lumbar region with  neurogenic claudication   Spondylosis of lumbar    Chronic pain syndrome CP    PAIN:  Are you having pain? Yes: NPRS scale: Current 8/10, Pain location: Across LB with radiation into RLE to knee Pain description: radiation/constant with standing LB Aggravating factors: standing walking Relieving factors: sitting/lying down  PRECAUTIONS: Fall; no more than 5 lb lifting  WEIGHT BEARING RESTRICTIONS No  FALLS:  Has patient fallen in last 6 months? No  LIVING ENVIRONMENT: Lives with: lives with their family Lives in: House/apartment Stairs: No Has following equipment at home: Grab bars and rollator  OCCUPATION: Retired  PLOF: Independent with community mobility with device  PATIENT GOALS Pt wants to be able to complete house hold chores, drive   OBJECTIVE:   DIAGNOSTIC FINDINGS: none in chart  PATIENT SURVEYS:  FOTO Risk adjusted: 48 with goal 55  COGNITION:  Overall cognitive status: Within functional limits for tasks assessed     SENSATION: WFL    MUSCLE LENGTH: Hamstrings: Right WFL deg; Left 60 deg   POSTURE:  Right lean; left shoulder/hip higher than right; cervical spine shifted right, forward shoulders  PALPATION: Slight discomfort right sided lumbar spine   LOWER EXTREMITY ROM:   WFL   LOWER EXTREMITY MMT:  MMT Right eval Left eval  Hip flexion 3 3  Hip extension    Hip abduction 4 4  Hip adduction 5 5  Hip internal rotation    Hip external rotation    Knee  flexion 4 4  Knee extension 4 4  Ankle dorsiflexion    Ankle plantarflexion    Ankle inversion    Ankle eversion     (Blank rows = not tested)  LOWER EXTREMITY SPECIAL TESTS:  Slump test neg R/L SLR test: + RLE @ 60d  FUNCTIONAL TESTS:  5 times sit to stand: 19.50 Timed up and go (TUG): 23.07 Berg Balance Scale: 26/56  GAIT: Distance walked: 500 Assistive device utilized: Walker - 4 wheeled Level of assistance: Complete Independence Comments: forward flexed position,  increased cadence    TODAY'S TREATMENT: Pt seen for aquatic therapy today.  Treatment took place in water 3.25-4.5 ft in depth at the [facility]. Temp of water was 91.  Pt entered/exited the pool via steps using side step pattern with bilat ue on 1 handrail rail, independently.   *Holding white barbell and SBA:  multiple widths, forward/back/side stepping; high knee marching. Cues for relaxing shoulders, and increased step length for improved control submerged. *Standing holding to wall: Heel/toe raises; squats; add/abd; hip extension x 10.   * straddling yellow noodle and HHA, cycling (challenging) *Stretching: Quad stretch with foot on 2nd step; Hamstring with foot on 2nd step * supported supine with yellow noodle under arms and one under knees and therapist supporting back of head - for spinal decompression  Pt requires the buoyancy and hydrostatic pressure of water for support, and to offload joints by unweighting joint load by at least 50 % in navel deep water and by at least 75-80% in chest to neck deep water.  Viscosity of the water is needed for resistance of strengthening. Water current perturbations provides challenge to standing balance requiring increased core activation.    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 Pt guarded initially, but relaxes well with cues as session continues.  Continued focus on easy movement for strengthening and ROM. Gained good stretch in post core overall decreasing LBP to 0/10 upon completion. She tolerates session well. Husband plans to join pt at pool outside of sessions. He is agreeable to joining her during future  session to understand how to better assist pt in water in community.  Goals are ongoing.   She is a good candidate for skilled aquatic and on land based physical therapy to improve all functional mobility, decrease fall risk and return pt to desired activities.   OBJECTIVE IMPAIRMENTS Abnormal gait, decreased activity tolerance, decreased balance, decreased coordination, decreased endurance, decreased mobility, difficulty walking, decreased ROM, decreased strength, postural dysfunction, and pain.   ACTIVITY LIMITATIONS carrying, lifting, bending, stairs, transfers, and dressing  PARTICIPATION LIMITATIONS: meal prep, cleaning, laundry, driving, shopping, community activity, and yard work  PERSONAL FACTORS Age, Past/current experiences, and 1-2 comorbidities: CP, OA, Lumbar stenosis  are also affecting patient's functional outcome.   REHAB POTENTIAL: Good  CLINICAL DECISION MAKING: Evolving/moderate complexity  EVALUATION COMPLEXITY: Moderate   GOALS: Goals reviewed with patient? Yes  SHORT TERM GOALS: Target date: 11/05/2021  Max pain to decrease to <6/10 with centralization of sx. Baseline:8/10 Goal status: INITIAL  2.  Pt to tolerate standing and walking up towards 15 minutes Baseline: 10 min Goal status: INITIAL  3.  Pt will improve on the TUG to <or= 15s  to demonstrate improved balance  and amb ability. Baseline: 23.07 - 19.50 Goal status: INITIAL  4.  Pt will improve on 5 x STS to <or=15s to demonstrate improved  strength Baseline: 19.50 Goal status: INITIAL  5.  Pt will return to driving to demonstrate return to PLOF Baseline:  Goal status: INITIAL    LONG TERM GOALS: Target date: 12/10/2021   Foto to improve to stated goal Baseline: 48 Goal status: INITIAL  2.  Pt to improve Berg to >or= 45/56 to demonstrate decreased fall risk. Baseline: 26/56 Goal status: INITIAL  3.  Pt to report no increase in pain sx with ambulation up towards 30 minutes Baseline:  8/10 Goal status: INITIAL  4.  Pt to return to house hold chores  with management of pain to demonstrate return to PLOF Baseline: unable to complete Goal status: INITIAL    PLAN: PT FREQUENCY: 1-2x/week  PT DURATION: 8 weeks  PLANNED INTERVENTIONS: Therapeutic exercises, Therapeutic activity, Neuromuscular re-education, Balance training, Gait training, Patient/Family education, Self Care, Joint mobilization, Joint manipulation, Stair training, Aquatic Therapy, Dry Needling, Spinal manipulation, Spinal mobilization, Cryotherapy, Moist heat, Taping, Ultrasound, and Manual therapy  PLAN FOR NEXT SESSION: Aquatics for stretching/strengthening of core, balance retraining  Kerin Perna, PTA 10/15/21 4:11 PM

## 2021-10-21 ENCOUNTER — Telehealth: Payer: Self-pay | Admitting: Cardiology

## 2021-10-21 DIAGNOSIS — E890 Postprocedural hypothyroidism: Secondary | ICD-10-CM | POA: Diagnosis not present

## 2021-10-21 NOTE — Telephone Encounter (Signed)
  Pt is requesting to switch from Dr. Ellyn Hack to Dr. Gardiner Rhyme. Please advise

## 2021-10-22 ENCOUNTER — Ambulatory Visit (HOSPITAL_BASED_OUTPATIENT_CLINIC_OR_DEPARTMENT_OTHER): Payer: PPO | Admitting: Physical Therapy

## 2021-10-22 ENCOUNTER — Encounter (HOSPITAL_BASED_OUTPATIENT_CLINIC_OR_DEPARTMENT_OTHER): Payer: Self-pay | Admitting: Physical Therapy

## 2021-10-22 DIAGNOSIS — R2681 Unsteadiness on feet: Secondary | ICD-10-CM

## 2021-10-22 DIAGNOSIS — R269 Unspecified abnormalities of gait and mobility: Secondary | ICD-10-CM

## 2021-10-22 DIAGNOSIS — G809 Cerebral palsy, unspecified: Secondary | ICD-10-CM | POA: Diagnosis not present

## 2021-10-22 DIAGNOSIS — M6281 Muscle weakness (generalized): Secondary | ICD-10-CM

## 2021-10-22 NOTE — Therapy (Signed)
OUTPATIENT PHYSICAL THERAPY LOWER EXTREMITY TREATMENT NOTE   Patient Name: Holly Mcconnell MRN: [id number] DOB:June 07, 1945, 76 y.o., female Today's Date: 10/22/2021   PT End of Session - 10/22/21 1454     Visit Number 4    Number of Visits 16    Date for PT Re-Evaluation 12/03/21    Authorization Type [employer]    PT Start Time 1445    PT Stop Time 1525    PT Time Calculation (min) 40 min    Behavior During Therapy [facility] for tasks assessed/performed             Past Medical History:  Diagnosis Date   Cerebral palsy ([place])    Mild symptoms.   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   Paroxysmal tachycardia, unspecified ([place]) 03/17/2018   Cerebral palsy ([place]) 03/01/2018   Lumbar radiculopathy 03/01/2018   Spinal stenosis of lumbar region with neurogenic claudication 03/01/2018   Radiculopathy due to lumbar intervertebral disc disorder 03/01/2018   Migraine headache    High cholesterol 06/11/2003    PCP: Cari Caraway, MD  REFERRING PROVIDER: Cari Caraway, MD  REFERRING DIAG:  G80.9 (ICD-10-CM) - Cerebral palsy, unspecified  R26.89 (ICD-10-CM) - Other abnormalities of gait and mobility    THERAPY DIAG:  Muscle weakness (generalized)  Abnormality of gait and mobility  Unsteadiness on feet  Rationale for Evaluation and Treatment Rehabilitation  ONSET DATE: 3 months  SUBJECTIVE:     SUBJECTIVE STATEMENT: Pt reports that she had pain relief for a few hours after last session. She saw PA and can now lift up to 20# and can drive. Her and husband joined [facility] with silver sneakers.   She is accompanied by her  husband (on deck).    PERTINENT HISTORY: Spinal stenosis of lumbar region with neurogenic claudication   Spondylosis of lumbar    Chronic pain syndrome CP    PAIN:  Are you having pain? Yes: NPRS scale: Current 6-7/10 "not bad", Pain location: Across LB with radiation into RLE to knee Pain description: radiation/constant with standing LB Aggravating factors: standing walking Relieving factors: sitting/lying down  PRECAUTIONS: Fall; 10/22/21: no more than 20 lb lifting  WEIGHT BEARING RESTRICTIONS No  FALLS:  Has patient fallen in last 6 months? No  LIVING ENVIRONMENT: Lives with: lives with their family Lives in: House/apartment Stairs: No Has following equipment at home: Grab bars and rollator  OCCUPATION: Retired  PLOF: Independent with community mobility with device  PATIENT GOALS Pt wants to be able to complete house hold chores, drive   OBJECTIVE:   DIAGNOSTIC FINDINGS: none in chart  PATIENT SURVEYS:  FOTO Risk adjusted: 48 with goal 55  COGNITION:  Overall cognitive status: Within functional limits for tasks assessed     SENSATION: WFL    MUSCLE LENGTH: Hamstrings: Right WFL deg; Left 60 deg   POSTURE:  Right lean; left shoulder/hip higher than right; cervical spine shifted right, forward shoulders  PALPATION: Slight discomfort right sided lumbar spine   LOWER EXTREMITY ROM:   WFL   LOWER EXTREMITY MMT:  MMT Right eval Left eval  Hip flexion 3 3  Hip extension  Hip abduction 4 4  Hip adduction 5 5  Hip internal rotation    Hip external rotation    Knee flexion 4 4  Knee extension 4 4  Ankle dorsiflexion    Ankle plantarflexion    Ankle inversion    Ankle eversion     (Blank rows = not tested)  LOWER EXTREMITY SPECIAL TESTS:  Slump test neg R/L SLR test: + RLE @ 60d  FUNCTIONAL TESTS:  5 times sit to stand: 19.50 Timed up and go (TUG): 23.07 Berg Balance Scale: 26/56  GAIT: Distance walked: 500 Assistive device  utilized: Walker - 4 wheeled Level of assistance: Complete Independence Comments: forward flexed position, increased cadence    TODAY'S TREATMENT: Pt seen for aquatic therapy today.  Treatment took place in water 3.25-4.5 ft in depth at the [facility]. Temp of water was 91.  Pt entered/exited the pool via steps using side step pattern with bilat ue on 1 handrail rail, independently.   *Standing holding to wall: side stepping R/L; Heel/toe raises; squats; add/abd; hip extension x 10.  *Holding white barbell and CGA/SBA:  multiple widths, forward/back/side stepping; high knee marching; tandem; SLS.   Cues for relaxing shoulders, and increased step length for improved control submerged.  * STS from bench with feet on blue step * in wide stance:  holding rainbow hand buoys, arm abdct/add; horiz abdct/ add; row * supported supine with yellow noodle under arms and one under knees and therapist supporting back of head - for spinal decompression  Pt requires the buoyancy and hydrostatic pressure of water for support, and to offload joints by unweighting joint load by at least 50 % in navel deep water and by at least 75-80% in chest to neck deep water.  Viscosity of the water is needed for resistance of strengthening. Water current perturbations provides challenge to standing balance requiring increased core activation.    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 Pt less guarded with gait than last session. Requires minor cues for posture and increased step length.  Continued focus on easy movement for strengthening and ROM. She reported elimination of pain by end of session. Plan to  have husband in water with pt in future session to understand how to better assist pt in water in community pool.  Goals are ongoing.     OBJECTIVE IMPAIRMENTS Abnormal gait, decreased activity tolerance, decreased balance, decreased coordination, decreased endurance, decreased mobility, difficulty walking, decreased ROM, decreased strength, postural dysfunction, and pain.   ACTIVITY LIMITATIONS carrying, lifting, bending, stairs, transfers, and dressing  PARTICIPATION LIMITATIONS: meal prep, cleaning, laundry, driving, shopping, community activity, and yard work  PERSONAL FACTORS Age, Past/current experiences, and 1-2 comorbidities: CP, OA, Lumbar stenosis  are also affecting patient's functional outcome.   REHAB POTENTIAL: Good  CLINICAL DECISION MAKING: Evolving/moderate complexity  EVALUATION COMPLEXITY: Moderate   GOALS: Goals reviewed with patient? Yes  SHORT TERM GOALS: Target date: 11/12/2021  Max pain to decrease to <6/10 with centralization of sx. Baseline:8/10 Goal status: INITIAL  2.  Pt to tolerate standing and walking up towards 15 minutes Baseline: 10 min Goal status: INITIAL  3.  Pt will improve on the TUG to <or= 15s  to demonstrate improved balance and amb ability. Baseline: 23.07 - 19.50  Goal status: INITIAL  4.  Pt will improve on 5 x STS to <or=15s to demonstrate improved  strength Baseline: 19.50 Goal status: INITIAL  5.  Pt will return to driving to demonstrate return to PLOF Baseline:  Goal status: INITIAL    LONG TERM GOALS: Target date: 12/17/2021   Foto to improve to stated goal Baseline: 48 Goal status: INITIAL  2.  Pt to improve Berg to >or= 45/56 to demonstrate decreased fall risk. Baseline: 26/56 Goal status: INITIAL  3.  Pt to report no increase in pain sx with ambulation up towards 30 minutes Baseline: 8/10 Goal status: INITIAL  4.  Pt to return to house hold chores  with management of pain to demonstrate return to  PLOF Baseline: unable to complete Goal status: INITIAL    PLAN: PT FREQUENCY: 1-2x/week  PT DURATION: 8 weeks  PLANNED INTERVENTIONS: Therapeutic exercises, Therapeutic activity, Neuromuscular re-education, Balance training, Gait training, Patient/Family education, Self Care, Joint mobilization, Joint manipulation, Stair training, Aquatic Therapy, Dry Needling, Spinal manipulation, Spinal mobilization, Cryotherapy, Moist heat, Taping, Ultrasound, and Manual therapy  PLAN FOR NEXT SESSION: Aquatics for stretching/strengthening of core, balance retraining Kerin Perna, PTA 10/22/21 6:19 PM

## 2021-10-23 ENCOUNTER — Ambulatory Visit (HOSPITAL_BASED_OUTPATIENT_CLINIC_OR_DEPARTMENT_OTHER): Payer: PPO | Admitting: Physical Therapy

## 2021-10-24 NOTE — Telephone Encounter (Signed)
Fine with me.  Holly Hew, MD

## 2021-10-24 NOTE — Telephone Encounter (Signed)
Ok

## 2021-10-29 ENCOUNTER — Encounter (HOSPITAL_BASED_OUTPATIENT_CLINIC_OR_DEPARTMENT_OTHER): Payer: Self-pay | Admitting: Physical Therapy

## 2021-10-29 ENCOUNTER — Ambulatory Visit (HOSPITAL_BASED_OUTPATIENT_CLINIC_OR_DEPARTMENT_OTHER): Payer: PPO | Admitting: Physical Therapy

## 2021-10-29 DIAGNOSIS — R269 Unspecified abnormalities of gait and mobility: Secondary | ICD-10-CM

## 2021-10-29 DIAGNOSIS — R2681 Unsteadiness on feet: Secondary | ICD-10-CM

## 2021-10-29 DIAGNOSIS — M6281 Muscle weakness (generalized): Secondary | ICD-10-CM

## 2021-10-29 DIAGNOSIS — G809 Cerebral palsy, unspecified: Secondary | ICD-10-CM | POA: Diagnosis not present

## 2021-10-29 NOTE — Therapy (Signed)
OUTPATIENT PHYSICAL THERAPY LOWER EXTREMITY TREATMENT NOTE   Patient Name: Holly Mcconnell MRN: [id number] DOB:19-Apr-1945, 76 y.o., female Today's Date: 10/29/2021   PT End of Session - 10/29/21 1031     Visit Number 5    Number of Visits 16    Date for PT Re-Evaluation 12/03/21    Authorization Type [employer]    PT Start Time 1032    PT Stop Time 1115    PT Time Calculation (min) 43 min             Past Medical History:  Diagnosis Date   Cerebral palsy ([place])    Mild symptoms.   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   Paroxysmal tachycardia, unspecified ([place]) 03/17/2018   Cerebral palsy ([place]) 03/01/2018   Lumbar radiculopathy 03/01/2018   Spinal stenosis of lumbar region with neurogenic claudication 03/01/2018   Radiculopathy due to lumbar intervertebral disc disorder 03/01/2018   Migraine headache    High cholesterol 06/11/2003    PCP: Cari Caraway, MD  REFERRING PROVIDER: Cari Caraway, MD  REFERRING DIAG:  G80.9 (ICD-10-CM) - Cerebral palsy, unspecified  R26.89 (ICD-10-CM) - Other abnormalities of gait and mobility    THERAPY DIAG:  Muscle weakness (generalized)  Abnormality of gait and mobility  Unsteadiness on feet  Rationale for Evaluation and Treatment Rehabilitation  ONSET DATE: 3 months  SUBJECTIVE:     SUBJECTIVE STATEMENT: Husband asks about slight valgus of wifes rle as to if that could be causing some knee pain.  [facility] have [facility] times since last session which they have enjoyed   PERTINENT HISTORY: Spinal stenosis of lumbar region with neurogenic claudication    Spondylosis of lumbar    Chronic pain syndrome CP    PAIN:  Are you having pain? Yes: NPRS scale: Current 6-7/10 "not bad", Pain location: Across LB with radiation into RLE to knee Pain description: radiation/constant with standing LB Aggravating factors: standing walking Relieving factors: sitting/lying down  PRECAUTIONS: Fall; 10/22/21: no more than 20 lb lifting  WEIGHT BEARING RESTRICTIONS No  FALLS:  Has patient fallen in last 6 months? No  LIVING ENVIRONMENT: Lives with: lives with their family Lives in: House/apartment Stairs: No Has following equipment at home: Grab bars and rollator  OCCUPATION: Retired  PLOF: Independent with community mobility with device  PATIENT GOALS Pt wants to be able to complete house hold chores, drive   OBJECTIVE:   DIAGNOSTIC FINDINGS: none in chart  PATIENT SURVEYS:  FOTO Risk adjusted: 48 with goal 55  COGNITION:  Overall cognitive status: Within functional limits for tasks assessed     SENSATION: WFL    MUSCLE LENGTH: Hamstrings: Right WFL deg; Left 60 deg   POSTURE:  Right lean; left shoulder/hip higher than right; cervical spine shifted right, forward shoulders  PALPATION: Slight discomfort right sided lumbar spine   LOWER EXTREMITY ROM:   WFL   LOWER EXTREMITY MMT:  MMT Right eval Left eval  Hip flexion 3 3  Hip extension    Hip abduction 4 4  Hip adduction 5 5  Hip internal rotation    Hip external rotation  Knee flexion 4 4  Knee extension 4 4  Ankle dorsiflexion    Ankle plantarflexion    Ankle inversion    Ankle eversion     (Blank rows = not tested)  LOWER EXTREMITY SPECIAL TESTS:  Slump test neg R/L SLR test: + RLE @ 60d  FUNCTIONAL TESTS:  5 times sit to stand: 19.50 Timed up and go (TUG): 23.07 Berg Balance Scale: 26/56  GAIT: Distance walked: 500 Assistive device utilized: Walker - 4 wheeled Level of assistance: Complete Independence Comments: forward flexed position,  increased cadence    TODAY'S TREATMENT: Pt seen for aquatic therapy today.  Treatment took place in water 3.25-4.5 ft in depth at the [facility]. Temp of water was 91.  Pt entered/exited the pool via steps using side step pattern with bilat ue on 1 handrail rail, independently.   *walking forward, back and side stepping multiple widths with sba of husband using white barbell *Holding white barbell and SBA:  multiple widths, forward/back/side stepping; high knee marching  -balance: tandem stance; SLS.               -toe raises, heel raises; add/abd; hip flex; hip ext ; squats x 10 * leaning against wall:  holding rainbow hand buoys, arm abdct/add; horiz abdct/ add; row   Pt requires the buoyancy and hydrostatic pressure of water for support, and to offload joints by unweighting joint load by at least 50 % in navel deep water and by at least 75-80% in chest to neck deep water.  Viscosity of the water is needed for resistance of strengthening. Water current perturbations provides challenge to standing balance requiring increased core activation.    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 Focused on strengthening and balance. Instruction on HEP pt and husband for completion at [facility]. VC and demonstration for proper execution. Cues for increasing challenge as they progress at the [facility] with HEP ( decreasing UE support, increasing/decreasing speed of execution).  They will complete ~3 times weekly.  They state they have enjoyed completing pool exercises together.  Husband verb and demonstrates ability to safely assist her.  They were given laminated copy with instructions to bring back with them to therapy  for [place] to add to as appropriate.    OBJECTIVE IMPAIRMENTS Abnormal gait, decreased activity tolerance, decreased balance, decreased coordination, decreased endurance, decreased mobility, difficulty walking, decreased ROM, decreased strength, postural dysfunction, and pain.   ACTIVITY LIMITATIONS carrying, lifting, bending, stairs, transfers, and dressing  PARTICIPATION LIMITATIONS: meal prep, cleaning, laundry, driving, shopping, community activity, and yard work  PERSONAL FACTORS Age, Past/current experiences, and 1-2 comorbidities: CP, OA, Lumbar stenosis  are also affecting patient's functional outcome.   REHAB POTENTIAL: Good  CLINICAL DECISION MAKING: Evolving/moderate complexity  EVALUATION COMPLEXITY: Moderate   GOALS: Goals reviewed with patient? Yes  SHORT TERM GOALS: Target date: 11/19/2021  Max pain to decrease to <6/10 with centralization of sx. Baseline:8/10 Goal status: INITIAL  2.  Pt to tolerate standing and walking up towards 15 minutes Baseline: 10 min Goal status: INITIAL  3.  Pt will improve on the TUG to <or= 15s  to demonstrate improved balance and amb ability. Baseline: 23.07 - 19.50 Goal status: INITIAL  4.  Pt will improve on 5 x STS to <or=15s  to demonstrate improved  strength Baseline: 19.50 Goal status: INITIAL  5.  Pt will return to driving to demonstrate return to PLOF Baseline:  Goal status: INITIAL    LONG TERM GOALS: Target date: 12/24/2021   Foto to improve to stated goal Baseline: 48 Goal status: INITIAL  2.  Pt to improve Berg to >or= 45/56 to demonstrate decreased fall risk. Baseline: 26/56 Goal status: INITIAL  3.  Pt to report no increase in pain sx with ambulation up towards 30 minutes Baseline: 8/10 Goal status: INITIAL  4.  Pt to return to house hold chores  with management of pain to demonstrate return to PLOF Baseline: unable to complete Goal status: INITIAL    PLAN: PT FREQUENCY: 1-2x/week  PT DURATION: 8  weeks  PLANNED INTERVENTIONS: Therapeutic exercises, Therapeutic activity, Neuromuscular re-education, Balance training, Gait training, Patient/Family education, Self Care, Joint mobilization, Joint manipulation, Stair training, Aquatic Therapy, Dry Needling, Spinal manipulation, Spinal mobilization, Cryotherapy, Moist heat, Taping, Ultrasound, and Manual therapy  PLAN FOR NEXT SESSION: Aquatics for stretching/strengthening of core, balance retraining [facility]) Noha Milberger MPT 10/29/21 1:32 PM

## 2021-10-31 DIAGNOSIS — M1711 Unilateral primary osteoarthritis, right knee: Secondary | ICD-10-CM | POA: Diagnosis not present

## 2021-10-31 DIAGNOSIS — M25561 Pain in right knee: Secondary | ICD-10-CM | POA: Diagnosis not present

## 2021-11-01 ENCOUNTER — Ambulatory Visit (HOSPITAL_BASED_OUTPATIENT_CLINIC_OR_DEPARTMENT_OTHER): Payer: PPO | Attending: Family Medicine | Admitting: Physical Therapy

## 2021-11-01 ENCOUNTER — Encounter (HOSPITAL_BASED_OUTPATIENT_CLINIC_OR_DEPARTMENT_OTHER): Payer: Self-pay | Admitting: Physical Therapy

## 2021-11-01 DIAGNOSIS — R269 Unspecified abnormalities of gait and mobility: Secondary | ICD-10-CM | POA: Insufficient documentation

## 2021-11-01 DIAGNOSIS — R2681 Unsteadiness on feet: Secondary | ICD-10-CM | POA: Diagnosis not present

## 2021-11-01 DIAGNOSIS — M6281 Muscle weakness (generalized): Secondary | ICD-10-CM | POA: Diagnosis not present

## 2021-11-01 NOTE — Therapy (Signed)
OUTPATIENT PHYSICAL THERAPY LOWER EXTREMITY TREATMENT NOTE   Patient Name: Holly Mcconnell MRN: [id number] DOB:03/26/1945, 76 y.o., female Today's Date: 11/01/2021   PT End of Session - 11/01/21 1043     Visit Number 6    Number of Visits 16    Date for PT Re-Evaluation 12/03/21    Authorization Type [employer]    PT Start Time 1032    PT Stop Time 1115    PT Time Calculation (min) 43 min    Activity Tolerance Patient tolerated treatment well    Behavior During Therapy WFL for tasks assessed/performed             Past Medical History:  Diagnosis Date   Cerebral palsy ([place])    Mild symptoms.   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   Paroxysmal tachycardia, unspecified ([place]) 03/17/2018   Cerebral palsy ([place]) 03/01/2018   Lumbar radiculopathy 03/01/2018   Spinal stenosis of lumbar region with neurogenic claudication 03/01/2018   Radiculopathy due to lumbar intervertebral disc disorder 03/01/2018   Migraine headache    High cholesterol 06/11/2003    PCP: Cari Caraway, MD  REFERRING PROVIDER: Cari Caraway, MD  REFERRING DIAG:  G80.9 (ICD-10-CM) - Cerebral palsy, unspecified  R26.89 (ICD-10-CM) - Other abnormalities of gait and mobility    THERAPY DIAG:  Muscle weakness (generalized)  Abnormality of gait and mobility  Unsteadiness on feet  Rationale for Evaluation and Treatment Rehabilitation  ONSET DATE: 3 months  SUBJECTIVE:     SUBJECTIVE STATEMENT: "Saw Dr Conan Bowens due to knee pain.  Got an injection and my knee already feels better.  Went to [employer] followed my HEP that you gave me and it was  great"   PERTINENT HISTORY: Spinal stenosis of lumbar region with neurogenic claudication   Spondylosis of lumbar    Chronic pain syndrome CP    PAIN:  Are you having pain? Yes: NPRS scale: Current 4/10 right knee "not bad", Pain location: Across LB with radiation into RLE to knee Pain description: radiation/constant with standing LB Aggravating factors: standing walking Relieving factors: sitting/lying down   PRECAUTIONS: Fall; 10/22/21: no more than 20 lb lifting  WEIGHT BEARING RESTRICTIONS No  FALLS:  Has patient fallen in last 6 months? No  LIVING ENVIRONMENT: Lives with: lives with their family Lives in: House/apartment Stairs: No Has following equipment at home: Grab bars and rollator  OCCUPATION: Retired  PLOF: Independent with community mobility with device  PATIENT GOALS Pt wants to be able to complete house hold chores, drive   OBJECTIVE:   DIAGNOSTIC FINDINGS: none in chart  PATIENT SURVEYS:  FOTO Risk adjusted: 48 with goal 55  COGNITION:  Overall cognitive status: Within functional limits for tasks assessed     SENSATION: WFL    MUSCLE LENGTH: Hamstrings: Right WFL deg; Left 60 deg   POSTURE:  Right lean; left shoulder/hip higher than right; cervical spine shifted right, forward shoulders  PALPATION: Slight discomfort right sided lumbar spine   LOWER EXTREMITY ROM:   WFL   LOWER EXTREMITY MMT:  MMT Right eval Left eval  Hip flexion 3 3  Hip extension    Hip abduction  4 4  Hip adduction 5 5  Hip internal rotation    Hip external rotation    Knee flexion 4 4  Knee extension 4 4  Ankle dorsiflexion    Ankle plantarflexion    Ankle inversion    Ankle eversion     (Blank rows = not tested)  LOWER EXTREMITY SPECIAL TESTS:  Slump test neg R/L SLR test: + RLE @ 60d  FUNCTIONAL TESTS:  5 times sit to stand: 19.50 Timed up and go (TUG): 23.07 Berg Balance Scale: 26/56  GAIT: Distance walked: 500 Assistive device  utilized: Walker - 4 wheeled Level of assistance: Complete Independence Comments: forward flexed position, increased cadence    TODAY'S TREATMENT: Pt seen for aquatic therapy today.  Treatment took place in water 3.25-4.5 ft in depth at the [facility]. Temp of water was 91.  Pt entered/exited the pool via steps using side step pattern with bilat ue on 1 handrail rail, independently.   *walking forward, back and side stepping multiple widths with sba of husband using white barbell *Holding 1 foam hand buoys and SBA:    -balance: tandem stance; SLS.               -toe raises, heel raises; add/abd; hip flex; hip ext ; squats x 10  - holding rainbow hand buoys, arm abdct/add; horiz abdct/ add; row *Seated 4th step: cycling; add/abd with df 3x20; flutter kicking 3 x20; Cues and demonstration initially for execution then for increased speed for increased resistance   Pt requires the buoyancy and hydrostatic pressure of water for support, and to offload joints by unweighting joint load by at least 50 % in navel deep water and by at least 75-80% in chest to neck deep water.  Viscosity of the water is needed for resistance of strengthening. Water current perturbations provides challenge to standing balance requiring increased core activation.    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 She had a cortizone shot in right knee yesterday with immediate positive results decreasing knee pain to 4/10. She reports no back pain since knee injection.  She has been compliant with aquatic HEP with assistance of husband at the [facility]. She demonstrates improved mobility  submerged walking with improved posture. Pt  and cg given further instruction on few of the HEP for improved understanding and execution.  Added aerobic capacity element which she tolerates very well.  She is progressing very well. Goals ongoing       OBJECTIVE IMPAIRMENTS Abnormal gait, decreased activity tolerance, decreased balance, decreased coordination, decreased endurance, decreased mobility, difficulty walking, decreased ROM, decreased strength, postural dysfunction, and pain.   ACTIVITY LIMITATIONS carrying, lifting, bending, stairs, transfers, and dressing  PARTICIPATION LIMITATIONS: meal prep, cleaning, laundry, driving, shopping, community activity, and yard work  PERSONAL FACTORS Age, Past/current experiences, and 1-2 comorbidities: CP, OA, Lumbar stenosis  are also affecting patient's functional outcome.   REHAB POTENTIAL: Good  CLINICAL DECISION MAKING: Evolving/moderate complexity  EVALUATION COMPLEXITY: Moderate   GOALS: Goals reviewed with patient? Yes  SHORT TERM GOALS: Target date: 11/22/2021  Max pain to decrease to <6/10 with centralization of sx. Baseline:8/10 Goal status: INITIAL  2.  Pt to tolerate standing and walking up towards 15 minutes Baseline: 10 min Goal status: INITIAL  3.  Pt will improve on the TUG to <or= 15s  to demonstrate improved balance and amb ability. Baseline: 23.07 - 19.50 Goal status: INITIAL  4.  Pt will improve on 5 x STS to <or=15s to demonstrate improved  strength Baseline: 19.50 Goal status: INITIAL  5.  Pt will return to driving to demonstrate return to PLOF Baseline:  Goal status: INITIAL    LONG TERM GOALS: Target date: 12/27/2021   Foto to improve to stated goal Baseline: 48 Goal status: INITIAL  2.  Pt to improve Berg to >or= 45/56 to demonstrate decreased fall risk. Baseline: 26/56 Goal status: INITIAL  3.  Pt to report no increase in pain sx with ambulation up towards 30 minutes Baseline: 8/10 Goal status: INITIAL  4.  Pt to return to house  hold chores  with management of pain to demonstrate return to PLOF Baseline: unable to complete Goal status: INITIAL    PLAN: PT FREQUENCY: 1-2x/week  PT DURATION: 8 weeks  PLANNED INTERVENTIONS: Therapeutic exercises, Therapeutic activity, Neuromuscular re-education, Balance training, Gait training, Patient/Family education, Self Care, Joint mobilization, Joint manipulation, Stair training, Aquatic Therapy, Dry Needling, Spinal manipulation, Spinal mobilization, Cryotherapy, Moist heat, Taping, Ultrasound, and Manual therapy  PLAN FOR NEXT SESSION: Aquatics for stretching/strengthening of core, balance retraining Stanton Kidney [facility] Aquas) Ambry Dix MPT 11/01/21 1:38 PM

## 2021-11-05 ENCOUNTER — Ambulatory Visit (HOSPITAL_BASED_OUTPATIENT_CLINIC_OR_DEPARTMENT_OTHER): Payer: PPO | Admitting: Physical Therapy

## 2021-11-05 ENCOUNTER — Encounter (HOSPITAL_BASED_OUTPATIENT_CLINIC_OR_DEPARTMENT_OTHER): Payer: Self-pay | Admitting: Physical Therapy

## 2021-11-05 DIAGNOSIS — M6281 Muscle weakness (generalized): Secondary | ICD-10-CM | POA: Diagnosis not present

## 2021-11-05 DIAGNOSIS — R269 Unspecified abnormalities of gait and mobility: Secondary | ICD-10-CM

## 2021-11-05 DIAGNOSIS — R2681 Unsteadiness on feet: Secondary | ICD-10-CM

## 2021-11-05 NOTE — Therapy (Signed)
OUTPATIENT PHYSICAL THERAPY LOWER EXTREMITY TREATMENT NOTE   Patient Name: Nalah Macioce MRN: [id number] DOB:1946/02/15, 76 y.o., female Today's Date: 11/05/2021   PT End of Session - 11/05/21 1303     Visit Number 7    Number of Visits 16    Date for PT Re-Evaluation 12/03/21    Authorization Type [employer]    PT Start Time 1035    PT Stop Time 1115    PT Time Calculation (min) 40 min    Activity Tolerance Patient tolerated treatment well    Behavior During Therapy WFL for tasks assessed/performed              Past Medical History:  Diagnosis Date   Cerebral palsy ([place])    Mild symptoms.   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   Paroxysmal tachycardia, unspecified ([place]) 03/17/2018   Cerebral palsy ([place]) 03/01/2018   Lumbar radiculopathy 03/01/2018   Spinal stenosis of lumbar region with neurogenic claudication 03/01/2018   Radiculopathy due to lumbar intervertebral disc disorder 03/01/2018   Migraine headache    High cholesterol 06/11/2003    PCP: Cari Caraway, MD  REFERRING PROVIDER: Cari Caraway, MD  REFERRING DIAG:  G80.9 (ICD-10-CM) - Cerebral palsy, unspecified  R26.89 (ICD-10-CM) - Other abnormalities of gait and mobility    THERAPY DIAG:  Muscle weakness (generalized)  Abnormality of gait and mobility  Unsteadiness on feet  Rationale for Evaluation and Treatment Rehabilitation  ONSET DATE: 3 months  SUBJECTIVE:     SUBJECTIVE STATEMENT: "Knee is better but having LBP down to my ankle today"  "I have returned to driving"   PERTINENT HISTORY: Spinal stenosis of lumbar region  with neurogenic claudication   Spondylosis of lumbar    Chronic pain syndrome CP    PAIN:  Are you having pain? Yes: NPRS scale: Current 2/10 right knee "not bad", LB with radiculopathy to ankle 8-9/10 Pain location: Across LB with radiation into RLE to knee Pain description: radiation/constant with standing LB Aggravating factors: standing walking Relieving factors: sitting/lying down   PRECAUTIONS: Fall; 10/22/21: no more than 20 lb lifting  WEIGHT BEARING RESTRICTIONS No  FALLS:  Has patient fallen in last 6 months? No  LIVING ENVIRONMENT: Lives with: lives with their family Lives in: House/apartment Stairs: No Has following equipment at home: Grab bars and rollator  OCCUPATION: Retired  PLOF: Independent with community mobility with device  PATIENT GOALS Pt wants to be able to complete house hold chores, drive   OBJECTIVE:   DIAGNOSTIC FINDINGS: none in chart  PATIENT SURVEYS:  FOTO Risk adjusted: 48 with goal 55  COGNITION:  Overall cognitive status: Within functional limits for tasks assessed     SENSATION: WFL    MUSCLE LENGTH: Hamstrings: Right WFL deg; Left 60 deg   POSTURE:  Right lean; left shoulder/hip higher than right; cervical spine shifted right, forward shoulders  PALPATION: Slight discomfort right sided lumbar spine   LOWER EXTREMITY ROM:   WFL   LOWER EXTREMITY MMT:  MMT Right eval Left eval  Hip flexion 3 3  Hip extension    Hip abduction 4 4  Hip adduction 5 5  Hip internal rotation    Hip external rotation    Knee flexion 4 4  Knee extension 4 4  Ankle dorsiflexion    Ankle plantarflexion    Ankle inversion    Ankle eversion     (Blank rows = not tested)  LOWER EXTREMITY SPECIAL TESTS:  Slump test neg R/L SLR test: + RLE @ 60d  FUNCTIONAL TESTS:  5 times sit to stand: 19.50 Timed up and go (TUG): 23.07 Berg Balance Scale: 26/56  GAIT: Distance walked: 500 Assistive device utilized: Walker - 4  wheeled Level of assistance: Complete Independence Comments: forward flexed position, increased cadence    TODAY'S TREATMENT: Pt seen for aquatic therapy today.  Treatment took place in water 3.25-4.5 ft in depth at the [facility]. Temp of water was 91.  Pt entered/exited the pool via steps using side step pattern with bilat ue on 1 handrail rail, independently.   *walking forward, back and side stepping multiple widths with sba of husband using white barbell *kick board row 2x15 feet together, then 2x15 staggered leading with ea LE *Kick board press 3 x 15 *Seated 4th step: cycling; add/abd with df 3x20; flutter kicking 3 x20; *Holding 1 foam hand buoys and SBA:    -SLS multiple tries.  Pt requires the buoyancy and hydrostatic pressure of water for support, and to offload joints by unweighting joint load by at least 50 % in navel deep water and by at least 75-80% in chest to neck deep water.  Viscosity of the water is needed for resistance of strengthening. Water current perturbations provides challenge to standing balance requiring increased core activation.    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 R knee pain continues to improve 2/10. Lbp with radiculopathy through right ankle slightly increased today. She does reports decreased severity as session progresses. Demonstrates improving core strength with toleration to new core exercises.  Goals ongoing.   OBJECTIVE IMPAIRMENTS Abnormal gait, decreased activity tolerance, decreased balance, decreased coordination, decreased endurance, decreased mobility, difficulty walking,  decreased ROM, decreased strength, postural dysfunction, and pain.   ACTIVITY LIMITATIONS carrying, lifting, bending, stairs, transfers, and dressing  PARTICIPATION LIMITATIONS: meal prep, cleaning, laundry, driving, shopping, community activity, and yard work  PERSONAL FACTORS Age, Past/current experiences, and 1-2 comorbidities: CP, OA, Lumbar stenosis  are also affecting patient's functional outcome.   REHAB POTENTIAL: Good  CLINICAL DECISION MAKING: Evolving/moderate complexity  EVALUATION COMPLEXITY: Moderate   GOALS: Goals reviewed with patient? Yes  SHORT TERM GOALS: Target date: 11/26/2021  Max pain to decrease to <6/10 with centralization of sx. Baseline:8/10 Goal status: INITIAL  2.  Pt to tolerate standing and walking up towards 15 minutes Baseline: 10 min Goal status: INITIAL  3.  Pt will improve on the TUG to <or= 15s  to demonstrate improved balance and amb ability. Baseline: 23.07 - 19.50 Goal status: INITIAL  4.  Pt will improve on 5 x STS to <or=15s to demonstrate improved  strength Baseline: 19.50 Goal status: INITIAL  5.  Pt will return to driving to demonstrate return to PLOF Baseline:  Goal status: Achieved    LONG TERM GOALS: Target date: 12/31/2021   Foto to improve to stated goal Baseline: 48 Goal status: INITIAL  2.  Pt to improve Berg to >or= 45/56 to demonstrate decreased fall risk. Baseline: 26/56 Goal status: INITIAL  3.  Pt to report no increase in pain sx with ambulation up towards 30 minutes Baseline: 8/10 Goal status: INITIAL  4.  Pt to return to house hold chores  with management of pain to demonstrate return to PLOF Baseline: unable to complete Goal status: INITIAL    PLAN: PT FREQUENCY: 1-2x/week  PT DURATION: 8 weeks  PLANNED INTERVENTIONS: Therapeutic exercises, Therapeutic activity, Neuromuscular re-education, Balance training, Gait training, Patient/Family education, Self Care, Joint mobilization, Joint  manipulation, Stair training, Aquatic Therapy, Dry Needling, Spinal manipulation, Spinal mobilization, Cryotherapy, Moist heat, Taping, Ultrasound, and Manual therapy  PLAN FOR NEXT SESSION: Aquatics for stretching/strengthening of core, balance retraining Stanton Kidney [facility]aron Aquas) Enda Santo MPT 11/05/21 1:04 PM

## 2021-11-06 DIAGNOSIS — G809 Cerebral palsy, unspecified: Secondary | ICD-10-CM | POA: Diagnosis not present

## 2021-11-06 DIAGNOSIS — E663 Overweight: Secondary | ICD-10-CM | POA: Diagnosis not present

## 2021-11-06 DIAGNOSIS — E782 Mixed hyperlipidemia: Secondary | ICD-10-CM | POA: Diagnosis not present

## 2021-11-07 DIAGNOSIS — D2272 Melanocytic nevi of left lower limb, including hip: Secondary | ICD-10-CM | POA: Diagnosis not present

## 2021-11-07 DIAGNOSIS — D2271 Melanocytic nevi of right lower limb, including hip: Secondary | ICD-10-CM | POA: Diagnosis not present

## 2021-11-07 DIAGNOSIS — L821 Other seborrheic keratosis: Secondary | ICD-10-CM | POA: Diagnosis not present

## 2021-11-07 DIAGNOSIS — D2261 Melanocytic nevi of right upper limb, including shoulder: Secondary | ICD-10-CM | POA: Diagnosis not present

## 2021-11-08 ENCOUNTER — Ambulatory Visit (HOSPITAL_BASED_OUTPATIENT_CLINIC_OR_DEPARTMENT_OTHER): Payer: PPO | Admitting: Physical Therapy

## 2021-11-08 ENCOUNTER — Encounter (HOSPITAL_BASED_OUTPATIENT_CLINIC_OR_DEPARTMENT_OTHER): Payer: Self-pay | Admitting: Physical Therapy

## 2021-11-08 DIAGNOSIS — R2681 Unsteadiness on feet: Secondary | ICD-10-CM

## 2021-11-08 DIAGNOSIS — R269 Unspecified abnormalities of gait and mobility: Secondary | ICD-10-CM

## 2021-11-08 DIAGNOSIS — M6281 Muscle weakness (generalized): Secondary | ICD-10-CM | POA: Diagnosis not present

## 2021-11-08 NOTE — Therapy (Signed)
OUTPATIENT PHYSICAL THERAPY LOWER EXTREMITY TREATMENT NOTE   Patient Name: Holly Mcconnell MRN: [id number] DOB:06/20/1945, 76 y.o., female Today's Date: 11/08/2021   PT End of Session - 11/08/21 0944     Visit Number 8    Number of Visits 16    Date for PT Re-Evaluation 12/03/21    Authorization Type [employer]    PT Start Time [phone]    PT Stop Time 1025    PT Time Calculation (min) 42 min    Activity Tolerance Patient tolerated treatment well    Behavior During Therapy [facility] for tasks assessed/performed              Past Medical History:  Diagnosis Date   Cerebral palsy ([place])    Mild symptoms.   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   Paroxysmal tachycardia, unspecified ([place]) 03/17/2018   Cerebral palsy ([place]) 03/01/2018   Lumbar radiculopathy 03/01/2018   Spinal stenosis of lumbar region with neurogenic claudication 03/01/2018   Radiculopathy due to lumbar intervertebral disc disorder 03/01/2018   Migraine headache    High cholesterol 06/11/2003    PCP: Cari Caraway, MD  REFERRING PROVIDER: Cari Caraway, MD  REFERRING DIAG:  G80.9 (ICD-10-CM) - Cerebral palsy, unspecified  R26.89 (ICD-10-CM) - Other abnormalities of gait and mobility    THERAPY DIAG:  Muscle weakness (generalized)  Abnormality of gait and mobility  Unsteadiness on feet  Rationale for Evaluation and Treatment Rehabilitation  ONSET DATE: 3 months  SUBJECTIVE:     SUBJECTIVE STATEMENT: Pt reports that the knee injection has helped.  Her and husband have been going to [facility] 1-2x/wk for 1 hr each time.    PERTINENT  HISTORY: Spinal stenosis of lumbar region with neurogenic claudication   Spondylosis of lumbar    Chronic pain syndrome CP    PAIN:  Are you having pain? Yes: NPRS scale: Current 2/10 right knee , LB  6/10 Pain location: see above Pain description: radiation/constant with standing LB Aggravating factors: standing walking Relieving factors: sitting/lying down   PRECAUTIONS: Fall; 10/22/21: no more than 20 lb lifting  WEIGHT BEARING RESTRICTIONS No  FALLS:  Has patient fallen in last 6 months? No  LIVING ENVIRONMENT: Lives with: lives with their family Lives in: House/apartment Stairs: No Has following equipment at home: Grab bars and rollator  OCCUPATION: Retired  PLOF: Independent with community mobility with device  PATIENT GOALS Pt wants to be able to complete house hold chores, drive   OBJECTIVE:   DIAGNOSTIC FINDINGS: none in chart  PATIENT SURVEYS:  FOTO Risk adjusted: 48 with goal 55  COGNITION:  Overall cognitive status: Within functional limits for tasks assessed     SENSATION: WFL    MUSCLE LENGTH: Hamstrings: Right WFL deg; Left 60 deg   POSTURE:  Right lean; left shoulder/hip higher than right; cervical spine shifted right, forward shoulders  PALPATION: Slight discomfort right sided lumbar spine   LOWER EXTREMITY ROM:   WFL   LOWER EXTREMITY MMT:  MMT Right eval Left eval  Hip flexion 3 3  Hip extension    Hip abduction 4 4  Hip adduction 5 5  Hip internal rotation  Hip external rotation    Knee flexion 4 4  Knee extension 4 4  Ankle dorsiflexion    Ankle plantarflexion    Ankle inversion    Ankle eversion     (Blank rows = not tested)  LOWER EXTREMITY SPECIAL TESTS:  Slump test neg R/L SLR test: + RLE @ 60d  FUNCTIONAL TESTS:  5 times sit to stand: 19.50 Timed up and go (TUG): 23.07 Berg Balance Scale: 26/56  GAIT: Distance walked: 500 Assistive device utilized: Walker - 4 wheeled Level of assistance: Complete  Independence Comments: forward flexed position, increased cadence    TODAY'S TREATMENT: Pt seen for aquatic therapy today.  Treatment took place in water 3.25-4.5 ft in depth at the [facility]. Temp of water was 91.  Pt entered/exited the pool via steps using side step pattern with bilat ue on 1 handrail rail, independently.  * testing prior to entry in water *walking forward, back and side stepping multiple widths with SBA of husband using white barbell * with red paddles on hands, near wall:  bilat shoulder abdct/ add, bilat shoulder ER/IR, bilat shoulder flex/ext, horiz abdct/ add * holding wall: heel/toe raises * Holding white barbell: hip abdct x 10 * one hand on wall and one hand on barbell - hip flex / ext (leg swings) x 10; hip circles   *kick board row wide stance, then staggered leading with ea LE *holding white barbell:  tandem forward/ backward;  marching forwards/ backwards; SLS x 5-10s; SLS with 3 direction toe taps x 5 each   Pt requires the buoyancy and hydrostatic pressure of water for support, and to offload joints by unweighting joint load by at least 50 % in navel deep water and by at least 75-80% in chest to neck deep water.  Viscosity of the water is needed for resistance of strengthening. Water current perturbations provides challenge to standing balance requiring increased core activation.    PATIENT EDUCATION:  Education details: updated laminated HEP Person educated: Patient, spouse Education method: Explanation Education comprehension: verbalized understanding   HOME EXERCISE PROGRAM: [id number]   ASSESSMENT:  CLINICAL IMPRESSION: Pt improved time for 5x STS and TUG. She has met STG 2-5. While in water, pain reduced to 3/10 in back and 1-2/10 in knee.  Added minor changes to laminated HEP; alternative exercises.  Pt is making good gains towards remaining goals.    OBJECTIVE IMPAIRMENTS Abnormal gait, decreased activity tolerance,  decreased balance, decreased coordination, decreased endurance, decreased mobility, difficulty walking, decreased ROM, decreased strength, postural dysfunction, and pain.   ACTIVITY LIMITATIONS carrying, lifting, bending, stairs, transfers, and dressing  PARTICIPATION LIMITATIONS: meal prep, cleaning, laundry, driving, shopping, community activity, and yard work  PERSONAL FACTORS Age, Past/current experiences, and 1-2 comorbidities: CP, OA, Lumbar stenosis  are also affecting patient's functional outcome.   REHAB POTENTIAL: Good  CLINICAL DECISION MAKING: Evolving/moderate complexity  EVALUATION COMPLEXITY: Moderate   GOALS: Goals reviewed with patient? Yes  SHORT TERM GOALS: Target date: 10/29/21 Max pain to decrease to <6/10 with centralization of sx. Baseline: continues to have radicular symptoms with walking and transitions; max pain 6-8/10 Goal status: Ongoing  2.  Pt to tolerate standing and walking up towards 15 minutes Baseline: 1 hr, per pt and husbands report Goal status: Achieved -11/08/21  3.  Pt will improve on the TUG to <or= 15s  to demonstrate improved balance and amb ability. Baseline: 14.22s Goal status: Achieved - 11/08/21  4.  Pt will improve on 5 x  STS to <or=15s to demonstrate improved  strength Baseline: 9.78s Goal status: Achieved - 11/08/21  5.  Pt will return to driving to demonstrate return to PLOF Baseline:  Goal status: Achieved - verbalized it happened at least 3 wks ago, 11/08/21    LONG TERM GOALS: Target date: 12/03/21  Foto to improve to stated goal Baseline: 48 Goal status: INITIAL  2.  Pt to improve Berg to >or= 45/56 to demonstrate decreased fall risk. Baseline: 26/56 Goal status: INITIAL  3.  Pt to report no increase in pain sx with ambulation up towards 30 minutes Baseline: 8/10 Goal status: INITIAL  4.  Pt to return to house hold chores  with management of pain to demonstrate return to PLOF Baseline: unable to complete Goal  status: INITIAL    PLAN: PT FREQUENCY: 1-2x/week  PT DURATION: 8 weeks  PLANNED INTERVENTIONS: Therapeutic exercises, Therapeutic activity, Neuromuscular re-education, Balance training, Gait training, Patient/Family education, Self Care, Joint mobilization, Joint manipulation, Stair training, Aquatic Therapy, Dry Needling, Spinal manipulation, Spinal mobilization, Cryotherapy, Moist heat, Taping, Ultrasound, and Manual therapy  PLAN FOR NEXT SESSION: Aquatics for stretching/strengthening of core, balance retraining  Kerin Perna, PTA 11/08/21 12:08 PM

## 2021-11-12 ENCOUNTER — Encounter (HOSPITAL_BASED_OUTPATIENT_CLINIC_OR_DEPARTMENT_OTHER): Payer: Self-pay | Admitting: Physical Therapy

## 2021-11-12 ENCOUNTER — Ambulatory Visit (HOSPITAL_BASED_OUTPATIENT_CLINIC_OR_DEPARTMENT_OTHER): Payer: PPO | Admitting: Physical Therapy

## 2021-11-12 DIAGNOSIS — R2681 Unsteadiness on feet: Secondary | ICD-10-CM

## 2021-11-12 DIAGNOSIS — M6281 Muscle weakness (generalized): Secondary | ICD-10-CM | POA: Diagnosis not present

## 2021-11-12 DIAGNOSIS — R269 Unspecified abnormalities of gait and mobility: Secondary | ICD-10-CM

## 2021-11-12 NOTE — Therapy (Signed)
OUTPATIENT PHYSICAL THERAPY LOWER EXTREMITY TREATMENT NOTE   Patient Name: Holly Mcconnell MRN: [id number] DOB:12-26-1945, 76 y.o., female Today's Date: 11/12/2021   PT End of Session - 11/12/21 1032     Visit Number 9    Number of Visits 16    Date for PT Re-Evaluation 12/03/21    Authorization Type [employer]    PT Start Time 1025    PT Stop Time 1110    PT Time Calculation (min) 45 min    Activity Tolerance Patient tolerated treatment well    Behavior During Therapy WFL for tasks assessed/performed              Past Medical History:  Diagnosis Date   Cerebral palsy ([place])    Mild symptoms.   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   Paroxysmal tachycardia, unspecified ([place]) 03/17/2018   Cerebral palsy ([place]) 03/01/2018   Lumbar radiculopathy 03/01/2018   Spinal stenosis of lumbar region with neurogenic claudication 03/01/2018   Radiculopathy due to lumbar intervertebral disc disorder 03/01/2018   Migraine headache    High cholesterol 06/11/2003    PCP: Cari Caraway, MD  REFERRING PROVIDER: Cari Caraway, MD  REFERRING DIAG:  G80.9 (ICD-10-CM) - Cerebral palsy, unspecified  R26.89 (ICD-10-CM) - Other abnormalities of gait and mobility    THERAPY DIAG:  Muscle weakness (generalized)  Abnormality of gait and mobility  Unsteadiness on feet  Rationale for Evaluation and Treatment Rehabilitation  ONSET DATE: 3 months  SUBJECTIVE:     SUBJECTIVE STATEMENT: Knee continues to do well.  Low back with radiculopathy seems consistent, hasn't changed much   PERTINENT HISTORY: Spinal stenosis of lumbar  region with neurogenic claudication   Spondylosis of lumbar    Chronic pain syndrome CP    PAIN:  Are you having pain? Yes: NPRS scale: Current 2/10 right knee , LB  6/10 Pain location: see above Pain description: radiation/constant with standing LB Aggravating factors: standing walking Relieving factors: sitting/lying down   PRECAUTIONS: Fall; 10/22/21: no more than 20 lb lifting  WEIGHT BEARING RESTRICTIONS No  FALLS:  Has patient fallen in last 6 months? No  LIVING ENVIRONMENT: Lives with: lives with their family Lives in: House/apartment Stairs: No Has following equipment at home: Grab bars and rollator  OCCUPATION: Retired  PLOF: Independent with community mobility with device  PATIENT GOALS Pt wants to be able to complete house hold chores, drive   OBJECTIVE:   DIAGNOSTIC FINDINGS: none in chart  PATIENT SURVEYS:  FOTO Risk adjusted: 48 with goal 55  COGNITION:  Overall cognitive status: Within functional limits for tasks assessed     SENSATION: WFL    MUSCLE LENGTH: Hamstrings: Right WFL deg; Left 60 deg   POSTURE:  Right lean; left shoulder/hip higher than right; cervical spine shifted right, forward shoulders  PALPATION: Slight discomfort right sided lumbar spine   LOWER EXTREMITY ROM:   WFL   LOWER EXTREMITY MMT:  MMT Right eval Left eval  Hip flexion 3 3  Hip extension    Hip abduction 4 4  Hip adduction 5 5  Hip internal rotation    Hip external rotation  Knee flexion 4 4  Knee extension 4 4  Ankle dorsiflexion    Ankle plantarflexion    Ankle inversion    Ankle eversion     (Blank rows = not tested)  LOWER EXTREMITY SPECIAL TESTS:  Slump test neg R/L SLR test: + RLE @ 60d  FUNCTIONAL TESTS:  5 times sit to stand: 19.50 Timed up and go (TUG): 23.07 Berg Balance Scale: 26/56  GAIT: Distance walked: 500 Assistive device utilized: Walker - 4 wheeled Level of assistance: Complete Independence Comments: forward  flexed position, increased cadence    TODAY'S TREATMENT: Pt seen for aquatic therapy today.  Treatment took place in water 3.25-4.5 ft in depth at the [facility]. Temp of water was 91.  Pt entered/exited the pool via steps using side step pattern with bilat ue on 1 handrail rail, independently.   *walking forward, back and side stepping multiple widths with SBA of husband using white barbell * with rainbow hand buoys near wall:  bilat shoulder abdct/ add, bilat shoulder ER/IR, bilat shoulder flex/ext, horiz abdct/ add * Holding blue and black barbell: hip abdct x 10: hip flex; hip ext ; hip flex/ext (leg swings); heel/toe raises: hip circles CW & CCW ;SLS with 3 direction toe taps x10 *Holding blue and black barbell: Tandem forward/ backward 2 widths ea;  marching forwards/ backwards 2 widths ea; SLS x 5-10s;   x10 *kick board row wide stance, then staggered leading with ea LE *Initiated sitting balance straddled on yellow noodle with yellow hand buoys for ue support.  Pt able to gain position after several tries.  Progressed to cycling 2 lengths of pool.     Pt requires the buoyancy and hydrostatic pressure of water for support, and to offload joints by unweighting joint load by at least 50 % in navel deep water and by at least 75-80% in chest to neck deep water.  Viscosity of the water is needed for resistance of strengthening. Water current perturbations provides challenge to standing balance requiring increased core activation.    PATIENT EDUCATION:  Education details: updated laminated HEP Person educated: Patient, spouse Education method: Explanation Education comprehension: verbalized understanding   HOME EXERCISE PROGRAM: [id number]   ASSESSMENT:  CLINICAL IMPRESSION:  Progressed strengthening and balance challenges completing exercises with decreased UE support. Added cycling on noodle with initial focus on sitting balance progressing to dynamic. Pt successful  fairly quickly. Cycling for reduced LBP with added aerobic capacity element.  Pt has reduction of pain throughout to 0/10 upon completion. Minor addition made to HEP. Next visit progress note. Goals ongoing     OBJECTIVE IMPAIRMENTS Abnormal gait, decreased activity tolerance, decreased balance, decreased coordination, decreased endurance, decreased mobility, difficulty walking, decreased ROM, decreased strength, postural dysfunction, and pain.   ACTIVITY LIMITATIONS carrying, lifting, bending, stairs, transfers, and dressing  PARTICIPATION LIMITATIONS: meal prep, cleaning, laundry, driving, shopping, community activity, and yard work  PERSONAL FACTORS Age, Past/current experiences, and 1-2 comorbidities: CP, OA, Lumbar stenosis  are also affecting patient's functional outcome.   REHAB POTENTIAL: Good  CLINICAL DECISION MAKING: Evolving/moderate complexity  EVALUATION COMPLEXITY: Moderate   GOALS: Goals reviewed with patient? Yes  SHORT TERM GOALS: Target date: 10/29/21 Max pain to decrease to <6/10 with centralization of sx. Baseline: continues to have radicular symptoms with walking and transitions; max pain 6-8/10 Goal status: Ongoing  2.  Pt to tolerate standing and walking up towards 15 minutes Baseline: 1 hr, per pt and husbands report Goal status: Achieved -11/08/21  3.  Pt will improve on the TUG to <or= 15s  to demonstrate improved balance and amb ability. Baseline: 14.22s Goal status: Achieved - 11/08/21  4.  Pt will improve on 5 x STS to <or=15s to demonstrate improved  strength Baseline: 9.78s Goal status: Achieved - 11/08/21  5.  Pt will return to driving to demonstrate return to PLOF Baseline:  Goal status: Achieved - verbalized it happened at least 3 wks ago, 11/08/21    LONG TERM GOALS: Target date: 12/03/21  Foto to improve to stated goal Baseline: 48 Goal status: INITIAL  2.  Pt to improve Berg to >or= 45/56 to demonstrate decreased fall risk. Baseline:  26/56 Goal status: INITIAL  3.  Pt to report no increase in pain sx with ambulation up towards 30 minutes Baseline: 8/10 Goal status: INITIAL  4.  Pt to return to house hold chores  with management of pain to demonstrate return to PLOF Baseline: unable to complete Goal status: INITIAL    PLAN: PT FREQUENCY: 1-2x/week  PT DURATION: 8 weeks  PLANNED INTERVENTIONS: Therapeutic exercises, Therapeutic activity, Neuromuscular re-education, Balance training, Gait training, Patient/Family education, Self Care, Joint mobilization, Joint manipulation, Stair training, Aquatic Therapy, Dry Needling, Spinal manipulation, Spinal mobilization, Cryotherapy, Moist heat, Taping, Ultrasound, and Manual therapy  PLAN FOR NEXT SESSION: Aquatics for stretching/strengthening of core, balance retraining  Stanton Kidney Tharon Aquas) Humbert Morozov MPT 11/12/21 10:34 AM

## 2021-11-14 ENCOUNTER — Ambulatory Visit (HOSPITAL_BASED_OUTPATIENT_CLINIC_OR_DEPARTMENT_OTHER): Payer: PPO | Admitting: Physical Therapy

## 2021-11-14 ENCOUNTER — Encounter (HOSPITAL_BASED_OUTPATIENT_CLINIC_OR_DEPARTMENT_OTHER): Payer: Self-pay | Admitting: Physical Therapy

## 2021-11-14 DIAGNOSIS — R2681 Unsteadiness on feet: Secondary | ICD-10-CM

## 2021-11-14 DIAGNOSIS — R269 Unspecified abnormalities of gait and mobility: Secondary | ICD-10-CM

## 2021-11-14 DIAGNOSIS — M6281 Muscle weakness (generalized): Secondary | ICD-10-CM | POA: Diagnosis not present

## 2021-11-14 NOTE — Therapy (Signed)
OUTPATIENT PHYSICAL THERAPY LOWER EXTREMITY TREATMENT NOTE Progress Note Reporting Period 10/08/21 to 11/14/21  See note below for Objective Data and Assessment of Progress/Goals.      Patient Name: Holly Mcconnell MRN: [id number] DOB:11/29/1945, 76 y.o., female Today's Date: 11/14/2021   PT End of Session - 11/14/21 1035     Visit Number 10    Number of Visits 16    Date for PT Re-Evaluation 12/03/21    Authorization Type [employer]    PT Start Time 1031    PT Stop Time 1115    PT Time Calculation (min) 44 min    Activity Tolerance Patient tolerated treatment well    Behavior During Therapy WFL for tasks assessed/performed              Past Medical History:  Diagnosis Date   Cerebral palsy ([place])    Mild symptoms.   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   Paroxysmal tachycardia, unspecified ([place]) 03/17/2018   Cerebral palsy ([place]) 03/01/2018   Lumbar radiculopathy 03/01/2018   Spinal stenosis of lumbar region with neurogenic claudication 03/01/2018   Radiculopathy due to lumbar intervertebral disc disorder 03/01/2018   Migraine headache    High cholesterol 06/11/2003    PCP: Cari Caraway, MD  REFERRING PROVIDER: Cari Caraway, MD  REFERRING DIAG:  G80.9 (ICD-10-CM) - Cerebral palsy, unspecified  R26.89 (ICD-10-CM) - Other abnormalities of gait and mobility    THERAPY DIAG:  Muscle weakness (generalized)  Abnormality of gait and mobility  Unsteadiness on feet  Rationale for Evaluation and Treatment Rehabilitation  ONSET DATE: 3 months  SUBJECTIVE:     SUBJECTIVE STATEMENT: Back pain  same   PERTINENT HISTORY: Spinal stenosis of lumbar region with neurogenic claudication   Spondylosis of lumbar    Chronic pain syndrome CP    PAIN:  Are you having pain? Yes: NPRS scale: Current 1-2/10 right knee , LB  7/10 Pain location: see above Pain description: radiation/constant with standing LB Aggravating factors: standing walking Relieving factors: sitting/lying down   PRECAUTIONS: Fall; 10/22/21: no more than 20 lb lifting  WEIGHT BEARING RESTRICTIONS No  FALLS:  Has patient fallen in last 6 months? No  LIVING ENVIRONMENT: Lives with: lives with their family Lives in: House/apartment Stairs: No Has following equipment at home: Grab bars and rollator  OCCUPATION: Retired  PLOF: Independent with community mobility with device  PATIENT GOALS Pt wants to be able to complete house hold chores, drive   OBJECTIVE:   DIAGNOSTIC FINDINGS: none in chart  PATIENT SURVEYS:  FOTO Risk adjusted: 48 with goal 55  COGNITION:  Overall cognitive status: Within functional limits for tasks assessed     SENSATION: WFL    MUSCLE LENGTH: Hamstrings: Right WFL deg; Left 60 deg   POSTURE:  Right lean; left shoulder/hip higher than right; cervical spine shifted right, forward shoulders  PALPATION: Slight discomfort right sided lumbar spine   LOWER EXTREMITY ROM:   WFL   LOWER EXTREMITY MMT:  MMT Right eval Left eval Right/Left  Hip flexion 3 3 3+/ 4  Hip extension     Hip abduction 4 4 5-/5-  Hip adduction 5  5 5/5  Hip internal rotation     Hip external rotation     Knee flexion 4 4 5-/5  Knee extension 4 4 5/5  Ankle dorsiflexion     Ankle plantarflexion     Ankle inversion     Ankle eversion      (Blank rows = not tested)  LOWER EXTREMITY SPECIAL TESTS:  Slump test neg R/L SLR test: + RLE @ 60d  FUNCTIONAL TESTS:  5 times sit to stand: 19.50 Timed up and go (TUG): 23.07 Berg Balance Scale: 26/56  9/14 5 X STS:12.43 TUG:12.46                        Berg:34 GAIT: Distance walked: 500 Assistive device utilized: Walker - 4 wheeled Level of assistance: Complete Independence Comments: forward flexed position, increased cadence    TODAY'S TREATMENT:  10 visit PN with objective testing and Pt/cg EDU  Pt seen for aquatic therapy today.  Treatment took place in water 3.25-4.5 ft in depth at the [facility]. Temp of water was 91.  Pt entered/exited the pool via steps using side step pattern with bilat ue on 1 handrail rail, independently.   *walking forward, back and side stepping multiple widths with SBA of husband using white barbell *stair tapping 3x10 with decreasing ue support to no ue suppoert. Verbal Cues and demonstrationfor execution then increasing speed *Hip flex/ext x 10 ue supported blue and black barbell *Hip extension with forward trunk R/L using barbell balance   Pt requires the buoyancy and hydrostatic pressure of water for support, and to offload joints by unweighting joint load by at least 50 % in navel deep water and by at least 75-80% in chest to neck deep water.  Viscosity of the water is needed for resistance of strengthening. Water current perturbations provides challenge to standing balance requiring increased core activation.    PATIENT EDUCATION:  Education details: updated laminated HEP Person educated: Patient, spouse Education method: Explanation Education comprehension: verbalized understanding   HOME EXERCISE PROGRAM: [id number]   ASSESSMENT:  CLINICAL IMPRESSION:  PN:Pt demonstrates improvements in all objective testing as in charts above. She has not had any falls.  She and husband have been completing aquatic HEP at the [facility].  She reports household chores have become easier although she continues to have difficulty dusting and vacuuming due to LBP. Pt and and cg instructed on pain scale and its indications and uses  (controlled vs uncontrolled pain). She reports desire to  get back to on land exercises which she participated in prior to episode.  Will plan on transitioning pt to on land visits alternating some with aquatics then [place] with Aquatic as well as on land HEP.  She will continue to benefit from skilled PT to improve functional mobility and safety returning pt to PLOF. FOTO to be completed next visit.      OBJECTIVE IMPAIRMENTS Abnormal gait, decreased activity tolerance, decreased balance, decreased coordination, decreased endurance, decreased mobility, difficulty walking, decreased ROM, decreased strength, postural dysfunction, and pain.   ACTIVITY LIMITATIONS carrying, lifting, bending, stairs, transfers, and dressing  PARTICIPATION LIMITATIONS: meal prep, cleaning, laundry, driving, shopping, community activity, and yard work  PERSONAL FACTORS Age, Past/current experiences, and 1-2 comorbidities: CP, OA, Lumbar stenosis  are also affecting patient's functional outcome.   REHAB POTENTIAL: Good  CLINICAL DECISION MAKING: Evolving/moderate complexity  EVALUATION COMPLEXITY: Moderate   GOALS: Goals reviewed with patient? Yes  SHORT TERM GOALS: Target date:  10/29/21 Max pain to decrease to <6/10 with centralization of sx. Baseline: continues to have radicular symptoms with walking and transitions; max pain 6-8/10 Goal status: Ongoing  2.  Pt to tolerate standing and walking up towards 15 minutes Baseline: 1 hr, per pt and husbands report Goal status: Achieved -11/08/21  3.  Pt will improve on the TUG to <or= 15s  to demonstrate improved balance and amb ability. Baseline: 14.22s Goal status: Achieved - 11/08/21  4.  Pt will improve on 5 x STS to <or=15s to demonstrate improved  strength Baseline: 9.78s Goal status: Achieved - 11/08/21  5.  Pt will return to driving to demonstrate return to PLOF Baseline:  Goal status: Achieved - verbalized it happened at least 3 wks ago, 11/08/21    LONG TERM GOALS: Target date: 12/03/21  Foto to improve  to stated goal Baseline: 48 Goal status: ongoing  2.  Pt to improve Berg to >or= 45/56 to demonstrate decreased fall risk. Baseline: 26/56 Goal status: ongoing  3.  Pt to report no increase in pain sx with ambulation up towards 30 minutes Baseline: 8/10:  slight increase 9/12 Goal status: ongoing  4.  Pt to return to house hold chores  with management of pain to demonstrate return to PLOF Baseline: unable to complete Goal status: ongoing    PLAN: PT FREQUENCY: 1-2x/week  PT DURATION: 8 weeks  PLANNED INTERVENTIONS: Therapeutic exercises, Therapeutic activity, Neuromuscular re-education, Balance training, Gait training, Patient/Family education, Self Care, Joint mobilization, Joint manipulation, Stair training, Aquatic Therapy, Dry Needling, Spinal manipulation, Spinal mobilization, Cryotherapy, Moist heat, Taping, Ultrasound, and Manual therapy  PLAN FOR NEXT SESSION: Aquatics for stretching/strengthening of core, balance retraining  Stanton Kidney Tharon Aquas) Jodine Muchmore MPT 11/14/21 1:51 PM

## 2021-11-19 ENCOUNTER — Encounter (HOSPITAL_BASED_OUTPATIENT_CLINIC_OR_DEPARTMENT_OTHER): Payer: Self-pay | Admitting: Physical Therapy

## 2021-11-19 ENCOUNTER — Ambulatory Visit (HOSPITAL_BASED_OUTPATIENT_CLINIC_OR_DEPARTMENT_OTHER): Payer: PPO | Admitting: Physical Therapy

## 2021-11-19 DIAGNOSIS — M6281 Muscle weakness (generalized): Secondary | ICD-10-CM

## 2021-11-19 DIAGNOSIS — R269 Unspecified abnormalities of gait and mobility: Secondary | ICD-10-CM

## 2021-11-19 DIAGNOSIS — R2681 Unsteadiness on feet: Secondary | ICD-10-CM

## 2021-11-19 NOTE — Therapy (Signed)
OUTPATIENT PHYSICAL THERAPY LOWER EXTREMITY TREATMENT NOTE     Patient Name: Holly Mcconnell MRN: [id number] DOB:September 01, 1945, 76 y.o., female Today's Date: 11/19/2021   PT End of Session - 11/19/21 1146     Visit Number 11    Number of Visits 16    Date for PT Re-Evaluation 12/03/21    Authorization Type [employer]    PT Start Time 1145    PT Stop Time 1225    PT Time Calculation (min) 40 min    Activity Tolerance Patient tolerated treatment well    Behavior During Therapy WFL for tasks assessed/performed               Past Medical History:  Diagnosis Date   Cerebral palsy ([place])    Mild symptoms.   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   Paroxysmal tachycardia, unspecified ([place]) 03/17/2018   Cerebral palsy ([place]) 03/01/2018   Lumbar radiculopathy 03/01/2018   Spinal stenosis of lumbar region with neurogenic claudication 03/01/2018   Radiculopathy due to lumbar intervertebral disc disorder 03/01/2018   Migraine headache    High cholesterol 06/11/2003    PCP: Cari Caraway, MD  REFERRING PROVIDER: Cari Caraway, MD  REFERRING DIAG:  G80.9 (ICD-10-CM) - Cerebral palsy, unspecified  R26.89 (ICD-10-CM) - Other abnormalities of gait and mobility    THERAPY DIAG:  Muscle weakness (generalized)  Abnormality of gait and mobility  Unsteadiness on feet  Rationale for Evaluation and Treatment Rehabilitation  ONSET DATE: 3 months  SUBJECTIVE:     SUBJECTIVE STATEMENT: Pt states that she is able to stand for longer now without back pain. Bending over and transfers are still causing her pain.     PERTINENT HISTORY: Spinal stenosis of lumbar region with neurogenic claudication   Spondylosis of lumbar    Chronic pain syndrome CP    PAIN:  Are you having pain? No: NPRS scale: 0/10 Pain location: see above Pain description: radiation/constant with standing LB Aggravating factors: standing walking Relieving factors: sitting/lying down   PRECAUTIONS: Fall; 10/22/21: no more than 20 lb lifting  WEIGHT BEARING RESTRICTIONS No  FALLS:  Has patient fallen in last 6 months? No  LIVING ENVIRONMENT: Lives with: lives with their family Lives in: House/apartment Stairs: No Has following equipment at home: Grab bars and rollator  OCCUPATION: Retired  PLOF: Independent with community mobility with device  PATIENT GOALS Pt wants to be able to complete house hold chores, drive   OBJECTIVE:  PATIENT SURVEYS:  FOTO Risk adjusted: 48 with goal 55  FOTO 11th: 49 pts  LOWER EXTREMITY MMT:  MMT Right eval Left eval Right/Left  Hip flexion 3 3 3+/ 4  Hip extension     Hip abduction 4 4 5-/5-  Hip adduction 5 5 5/5  Hip internal rotation     Hip external rotation     Knee flexion 4 4 5-/5  Knee extension 4 4 5/5  Ankle dorsiflexion     Ankle plantarflexion     Ankle inversion     Ankle eversion      (Blank rows = not tested)  FUNCTIONAL TESTS:  5 times sit to stand: 19.50 Timed  up and go (TUG): 23.07 Berg Balance Scale: 26/56  9/14 5 X STS:12.43 TUG:12.46                       Berg:34  GAIT: Distance walked: 500 Assistive device utilized: Walker - 4 wheeled Level of assistance: Complete Independence Comments: forward flexed position, increased cadence  TODAY'S TREATMENT:   9/19  PPT 2s 10x Neutral curl up 2x10 LTR 3s 10x Bridge with band at knees GTB 2x10  Seated QL 30s 2x each Seated pelvic tilting 15x (focus on pelvic rotation) STS tall table height with focus on TrA brace during transition 5x5 Seated lumbar segmental flexion  10x     Previous: 10 visit PN with objective testing and Pt/cg EDU  Pt seen for aquatic therapy today.  Treatment took place in water 3.25-4.5 ft in depth at the [facility]. Temp of water was 91.  Pt entered/exited the pool via steps using side step pattern with bilat ue on 1 handrail rail, independently.   *walking forward, back and side stepping multiple widths with SBA of husband using white barbell *stair tapping 3x10 with decreasing ue support to no ue suppoert. Verbal Cues and demonstrationfor execution then increasing speed *Hip flex/ext x 10 ue supported blue and black barbell *Hip extension with forward trunk R/L using barbell balance   Pt requires the buoyancy and hydrostatic pressure of water for support, and to offload joints by unweighting joint load by at least 50 % in navel deep water and by at least 75-80% in chest to neck deep water.  Viscosity of the water is needed for resistance of strengthening. Water current perturbations provides challenge to standing balance requiring increased core activation.    PATIENT EDUCATION:  Education details: updated laminated HEP Person educated: Patient, spouse Education method: Explanation Education comprehension: verbalized understanding   HOME EXERCISE PROGRAM: [id number]  ASSESSMENT:  CLINICAL IMPRESSION:  Pt session focus today on progression of lumbar mobility exercise for pt as well focus on abdominal bracing during functional movements in order movement sensitivity during transfers. Pt had good success with reduction of pain with cuing for throttling of abdominal bracing. Pt given land based HEP at today's session. Plan to review land based movements at next session and progress functional lumbopelvic stability and strength as tolerated. Pt does appear to be very stiff dominant at this time due to history of back surgery. Pt will continue to benefit from skilled PT to improve functional mobility and safety  returning pt to PLOF.      OBJECTIVE IMPAIRMENTS Abnormal gait, decreased activity tolerance, decreased balance, decreased coordination, decreased endurance, decreased mobility, difficulty walking, decreased ROM, decreased strength, postural dysfunction, and pain.   ACTIVITY LIMITATIONS carrying, lifting, bending, stairs, transfers, and dressing  PARTICIPATION LIMITATIONS: meal prep, cleaning, laundry, driving, shopping, community activity, and yard work  PERSONAL FACTORS Age, Past/current experiences, and 1-2 comorbidities: CP, OA, Lumbar stenosis  are also affecting patient's functional outcome.   REHAB POTENTIAL: Good  CLINICAL DECISION MAKING: Evolving/moderate complexity  EVALUATION COMPLEXITY: Moderate   GOALS: Goals reviewed with patient? Yes  SHORT TERM GOALS: Target date: 10/29/21 Max pain to decrease to <6/10 with centralization of sx. Baseline: continues to have radicular symptoms with walking and transitions; max pain 6-8/10 Goal status: Ongoing  2.  Pt to tolerate standing and walking up towards 15 minutes Baseline: 1 hr, per pt and husbands report Goal status: Achieved -11/08/21  3.  Pt will improve on the TUG to <  or= 15s  to demonstrate improved balance and amb ability. Baseline: 14.22s Goal status: Achieved - 11/08/21  4.  Pt will improve on 5 x STS to <or=15s to demonstrate improved  strength Baseline: 9.78s Goal status: Achieved - 11/08/21  5.  Pt will return to driving to demonstrate return to PLOF Baseline:  Goal status: Achieved - verbalized it happened at least 3 wks ago, 11/08/21    LONG TERM GOALS: Target date: 12/03/21  Foto to improve to stated goal Baseline: 48 Goal status: ongoing  2.  Pt to improve Berg to >or= 45/56 to demonstrate decreased fall risk. Baseline: 26/56 Goal status: ongoing  3.  Pt to report no increase in pain sx with ambulation up towards 30 minutes Baseline: 8/10:  slight increase 9/12 Goal status: ongoing  4.  Pt to  return to house hold chores  with management of pain to demonstrate return to PLOF Baseline: unable to complete Goal status: ongoing    PLAN: PT FREQUENCY: 1-2x/week  PT DURATION: 8 weeks  PLANNED INTERVENTIONS: Therapeutic exercises, Therapeutic activity, Neuromuscular re-education, Balance training, Gait training, Patient/Family education, Self Care, Joint mobilization, Joint manipulation, Stair training, Aquatic Therapy, Dry Needling, Spinal manipulation, Spinal mobilization, Cryotherapy, Moist heat, Taping, Ultrasound, and Manual therapy  PLAN FOR NEXT SESSION: alternating land and aquatics for stretching/strengthening of core, balance retraining  Daleen Bo PT, DPT 11/19/21 1:12 PM

## 2021-11-21 ENCOUNTER — Ambulatory Visit (HOSPITAL_BASED_OUTPATIENT_CLINIC_OR_DEPARTMENT_OTHER): Payer: PPO | Admitting: Physical Therapy

## 2021-11-21 ENCOUNTER — Encounter (HOSPITAL_BASED_OUTPATIENT_CLINIC_OR_DEPARTMENT_OTHER): Payer: Self-pay | Admitting: Physical Therapy

## 2021-11-21 DIAGNOSIS — M6281 Muscle weakness (generalized): Secondary | ICD-10-CM | POA: Diagnosis not present

## 2021-11-21 DIAGNOSIS — R269 Unspecified abnormalities of gait and mobility: Secondary | ICD-10-CM

## 2021-11-21 DIAGNOSIS — R2681 Unsteadiness on feet: Secondary | ICD-10-CM

## 2021-11-21 NOTE — Therapy (Signed)
OUTPATIENT PHYSICAL THERAPY LOWER EXTREMITY TREATMENT NOTE     Patient Name: Holly Mcconnell MRN: [id number] DOB:1945/08/11, 76 y.o., female Today's Date: 11/21/2021   PT End of Session - 11/21/21 1026     Visit Number 12    Number of Visits 16    Date for PT Re-Evaluation 12/03/21    Authorization Type [employer]    PT Start Time 1027    PT Stop Time 1110    PT Time Calculation (min) 43 min    Activity Tolerance Patient tolerated treatment well    Behavior During Therapy WFL for tasks assessed/performed               Past Medical History:  Diagnosis Date   Cerebral palsy ([place])    Mild symptoms.   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   Paroxysmal tachycardia, unspecified ([place]) 03/17/2018   Cerebral palsy ([place]) 03/01/2018   Lumbar radiculopathy 03/01/2018   Spinal stenosis of lumbar region with neurogenic claudication 03/01/2018   Radiculopathy due to lumbar intervertebral disc disorder 03/01/2018   Migraine headache    High cholesterol 06/11/2003    PCP: Cari Caraway, MD  REFERRING PROVIDER: Cari Caraway, MD  REFERRING DIAG:  G80.9 (ICD-10-CM) - Cerebral palsy, unspecified  R26.89 (ICD-10-CM) - Other abnormalities of gait and mobility    THERAPY DIAG:  Muscle weakness (generalized)  Abnormality of gait and mobility  Unsteadiness on feet  Rationale for Evaluation and Treatment Rehabilitation  ONSET DATE: 3 months  SUBJECTIVE:     SUBJECTIVE STATEMENT: Pt reports she had a fall after last PT session.  She had just put her walker in the back seat and was walking towards front door when she  fell.  She landed on Lt elbow (observed to have bruises and abrasion ).     PERTINENT HISTORY: Spinal stenosis of lumbar region with neurogenic claudication   Spondylosis of lumbar    Chronic pain syndrome CP    PAIN:  Are you having pain? No: NPRS scale: 6-7/10 Pain location: lower back Pain description: radiation/constant with standing LB Aggravating factors: standing walking Relieving factors: sitting/lying down   PRECAUTIONS: Fall; 10/22/21: no more than 20 lb lifting  WEIGHT BEARING RESTRICTIONS No  FALLS:  Has patient fallen in last 6 months? No  LIVING ENVIRONMENT: Lives with: lives with their family Lives in: House/apartment Stairs: No Has following equipment at home: Grab bars and rollator  OCCUPATION: Retired  PLOF: Independent with community mobility with device  PATIENT GOALS Pt wants to be able to complete house hold chores, drive   OBJECTIVE:  PATIENT SURVEYS:  FOTO Risk adjusted: 48 with goal 55  FOTO 11th: 49 pts  LOWER EXTREMITY MMT:  MMT Right eval Left eval Right/Left  Hip flexion 3 3 3+/ 4  Hip extension     Hip abduction 4 4 5-/5-  Hip adduction 5 5 5/5  Hip internal rotation     Hip external rotation     Knee flexion 4 4 5-/5  Knee extension 4 4 5/5  Ankle dorsiflexion     Ankle plantarflexion     Ankle inversion     Ankle eversion      (  Blank rows = not tested)  FUNCTIONAL TESTS:  5 times sit to stand: 19.50 Timed up and go (TUG): 23.07 Berg Balance Scale: 26/56  9/14 5 X STS:12.43 TUG:12.46                       Berg:34  GAIT: Distance walked: 500 Assistive device utilized: Walker - 4 wheeled Level of assistance: Complete Independence Comments: forward flexed position, increased cadence  TODAY'S TREATMENT: 9/21 Pt seen for aquatic therapy today.  Treatment took place in water 3.25-4.5 ft in depth at the [facility]. Temp of water was 92.  Pt entered/exited the pool via steps using side step pattern  with bilat ue on 1 handrail rail, independently.  with SBA of husband using white barbell: *walking forward, back and side stepping multiple widths  * SLS and tandem stance, each x 15 sec - return to walking forward * SLS with 3 way toe taps x 10 each; bilat heel raises; hip circles CW/ CCW * Staggered stance with barbell push/pull  * STS on bench with blue step under feet cues for hip hinge, core engaged, nose over toes * High knee marching forward/ backward  * at stairs, UE on rail - Rt forward step up x 5; Lt forward step down/Rt retro step up x 5  Pt requires the buoyancy and hydrostatic pressure of water for support, and to offload joints by unweighting joint load by at least 50 % in navel deep water and by at least 75-80% in chest to neck deep water.  Viscosity of the water is needed for resistance of strengthening. Water current perturbations provides challenge to standing balance requiring increased core activation.  9/19  PPT 2s 10x Neutral curl up 2x10 LTR 3s 10x Bridge with band at knees GTB 2x10  Seated QL 30s 2x each Seated pelvic tilting 15x (focus on pelvic rotation) STS tall table height with focus on TrA brace during transition 5x5 Seated lumbar segmental flexion 10x     PATIENT EDUCATION:  Education details: updated laminated HEP Person educated: Patient, spouse Education method: Explanation Education comprehension: verbalized understanding   HOME EXERCISE PROGRAM: [id number]  ASSESSMENT:  CLINICAL IMPRESSION:   Despite recent fall, pt reported reduction of back pain to 0/10 with standing exercises. Pain increased to 3-4/10 with STS exercise; reduced back to 0/10 with return to walking.   Encouraged pt and husband to monitor pain level after 30 min of walking (for LTG#3).  Pt making great progress towards remaining goals.  will continue to benefit from skilled PT to improve functional mobility and safety returning pt to PLOF.    OBJECTIVE IMPAIRMENTS  Abnormal gait, decreased activity tolerance, decreased balance, decreased coordination, decreased endurance, decreased mobility, difficulty walking, decreased ROM, decreased strength, postural dysfunction, and pain.   ACTIVITY LIMITATIONS carrying, lifting, bending, stairs, transfers, and dressing  PARTICIPATION LIMITATIONS: meal prep, cleaning, laundry, driving, shopping, community activity, and yard work  PERSONAL FACTORS Age, Past/current experiences, and 1-2 comorbidities: CP, OA, Lumbar stenosis  are also affecting patient's functional outcome.   REHAB POTENTIAL: Good  CLINICAL DECISION MAKING: Evolving/moderate complexity  EVALUATION COMPLEXITY: Moderate   GOALS: Goals reviewed with patient? Yes  SHORT TERM GOALS: Target date: 10/29/21 Max pain to decrease to <6/10 with centralization of sx. Baseline: continues to have radicular symptoms with walking and transitions; max pain 6-8/10 Goal status: Ongoing  2.  Pt to tolerate standing and walking up towards 15 minutes Baseline: 1 hr, per pt and  husbands report Goal status: Achieved -11/08/21  3.  Pt will improve on the TUG to <or= 15s  to demonstrate improved balance and amb ability. Baseline: 14.22s Goal status: Achieved - 11/08/21  4.  Pt will improve on 5 x STS to <or=15s to demonstrate improved  strength Baseline: 9.78s Goal status: Achieved - 11/08/21  5.  Pt will return to driving to demonstrate return to PLOF Baseline:  Goal status: Achieved - verbalized it happened at least 3 wks ago, 11/08/21    LONG TERM GOALS: Target date: 12/03/21  Foto to improve to stated goal Baseline: 49-  11th visit Goal status: ongoing  2.  Pt to improve Berg to >or= 45/56 to demonstrate decreased fall risk. Baseline: 34/56 -11/13/21 Goal status: ongoing  3.  Pt to report no increase in pain sx with ambulation up towards 30 minutes Baseline: 8/10:  slight increase 9/12 Goal status: ongoing  4.  Pt to return to house hold chores  with  management of pain to demonstrate return to PLOF Baseline: unable to complete Goal status: ongoing    PLAN: PT FREQUENCY: 1-2x/week  PT DURATION: 8 weeks  PLANNED INTERVENTIONS: Therapeutic exercises, Therapeutic activity, Neuromuscular re-education, Balance training, Gait training, Patient/Family education, Self Care, Joint mobilization, Joint manipulation, Stair training, Aquatic Therapy, Dry Needling, Spinal manipulation, Spinal mobilization, Cryotherapy, Moist heat, Taping, Ultrasound, and Manual therapy  PLAN FOR NEXT SESSION: alternating land and aquatics for stretching/strengthening of core, balance retraining  Kerin Perna, PTA 11/21/21 1:46 PM [place], [place], [address] Phone: [phone]   Fax:  [phone]

## 2021-11-25 ENCOUNTER — Encounter (HOSPITAL_BASED_OUTPATIENT_CLINIC_OR_DEPARTMENT_OTHER): Payer: Self-pay | Admitting: Physical Therapy

## 2021-11-25 ENCOUNTER — Ambulatory Visit (HOSPITAL_BASED_OUTPATIENT_CLINIC_OR_DEPARTMENT_OTHER): Payer: PPO | Admitting: Physical Therapy

## 2021-11-25 DIAGNOSIS — M6281 Muscle weakness (generalized): Secondary | ICD-10-CM

## 2021-11-25 DIAGNOSIS — R2681 Unsteadiness on feet: Secondary | ICD-10-CM

## 2021-11-25 DIAGNOSIS — R269 Unspecified abnormalities of gait and mobility: Secondary | ICD-10-CM

## 2021-11-25 NOTE — Therapy (Signed)
OUTPATIENT PHYSICAL THERAPY LOWER EXTREMITY TREATMENT NOTE     Patient Name: Holly Mcconnell MRN: [id number] DOB:06-09-1945, 76 y.o., female Today's Date: 11/25/2021   PT End of Session - 11/25/21 1022     Visit Number 13    Number of Visits 16    Date for PT Re-Evaluation 12/03/21    Authorization Type [employer]    PT Start Time 1016    PT Stop Time 1056    PT Time Calculation (min) 40 min    Activity Tolerance Patient tolerated treatment well    Behavior During Therapy WFL for tasks assessed/performed                Past Medical History:  Diagnosis Date   Cerebral palsy ([place])    Mild symptoms.   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   Paroxysmal tachycardia, unspecified ([place]) 03/17/2018   Cerebral palsy ([place]) 03/01/2018   Lumbar radiculopathy 03/01/2018   Spinal stenosis of lumbar region with neurogenic claudication 03/01/2018   Radiculopathy due to lumbar intervertebral disc disorder 03/01/2018   Migraine headache    High cholesterol 06/11/2003    PCP: Cari Caraway, MD  REFERRING PROVIDER: Cari Caraway, MD  REFERRING DIAG:  G80.9 (ICD-10-CM) - Cerebral palsy, unspecified  R26.89 (ICD-10-CM) - Other abnormalities of gait and mobility    THERAPY DIAG:  Muscle weakness (generalized)  Abnormality of gait and mobility  Unsteadiness on feet  Rationale for Evaluation and Treatment Rehabilitation  ONSET DATE: 3 months  SUBJECTIVE:     SUBJECTIVE STATEMENT: Pt states she had no issues after last land session but did have a fall as previously documented. She feels okay today. No extra  stiffness and was able to go to [facility] after last pool session.    PERTINENT HISTORY: Spinal stenosis of lumbar region with neurogenic claudication   Spondylosis of lumbar    Chronic pain syndrome CP    PAIN:  Are you having pain? No: NPRS scale: 6-7/10 Pain location: lower back Pain description: radiation/constant with standing LB Aggravating factors: standing walking Relieving factors: sitting/lying down   PRECAUTIONS: Fall; 10/22/21: no more than 20 lb lifting  WEIGHT BEARING RESTRICTIONS No  FALLS:  Has patient fallen in last 6 months? No  LIVING ENVIRONMENT: Lives with: lives with their family Lives in: House/apartment Stairs: No Has following equipment at home: Grab bars and rollator  OCCUPATION: Retired  PLOF: Independent with community mobility with device  PATIENT GOALS Pt wants to be able to complete house hold chores, drive   OBJECTIVE:  PATIENT SURVEYS:  FOTO Risk adjusted: 48 with goal 55  FOTO 11th: 49 pts  LOWER EXTREMITY MMT:  MMT Right eval Left eval Right/Left  Hip flexion 3 3 3+/ 4  Hip extension     Hip abduction 4 4 5-/5-  Hip adduction 5 5 5/5  Hip internal rotation     Hip external rotation     Knee flexion 4 4 5-/5  Knee extension 4 4 5/5  Ankle dorsiflexion     Ankle plantarflexion     Ankle inversion     Ankle eversion      (Blank rows =  not tested)  FUNCTIONAL TESTS:  5 times sit to stand: 19.50 Timed up and go (TUG): 23.07 Berg Balance Scale: 26/56  9/14 5 X STS:12.43 TUG:12.46                       Berg:34  GAIT: Distance walked: 500 Assistive device utilized: Walker - 4 wheeled Level of assistance: Complete Independence Comments: forward flexed position, increased cadence  TODAY'S TREATMENT:  9/25  Nustep L3 5 min   Paloff press RTB 2x10 with NBOS Tandem on airex 30s 3x each Sidestepping YTB ankles at table 4x laps with UE Standing lunge 2x10 each side with UE  Resisted STS with YTB at hips 5x5 (focus  on slow eccentric lower, posture, hip extension, power with eccentric)   9/21 Pt seen for aquatic therapy today.  Treatment took place in water 3.25-4.5 ft in depth at the [facility]. Temp of water was 92.  Pt entered/exited the pool via steps using side step pattern with bilat ue on 1 handrail rail, independently.  with SBA of husband using white barbell: *walking forward, back and side stepping multiple widths  * SLS and tandem stance, each x 15 sec - return to walking forward * SLS with 3 way toe taps x 10 each; bilat heel raises; hip circles CW/ CCW * Staggered stance with barbell push/pull  * STS on bench with blue step under feet cues for hip hinge, core engaged, nose over toes * High knee marching forward/ backward  * at stairs, UE on rail - Rt forward step up x 5; Lt forward step down/Rt retro step up x 5  Pt requires the buoyancy and hydrostatic pressure of water for support, and to offload joints by unweighting joint load by at least 50 % in navel deep water and by at least 75-80% in chest to neck deep water.  Viscosity of the water is needed for resistance of strengthening. Water current perturbations provides challenge to standing balance requiring increased core activation.  9/19  PPT 2s 10x Neutral curl up 2x10 LTR 3s 10x Bridge with band at knees GTB 2x10  Seated QL 30s 2x each Seated pelvic tilting 15x (focus on pelvic rotation) STS tall table height with focus on TrA brace during transition 5x5 Seated lumbar segmental flexion 10x     PATIENT EDUCATION:  Education details: updated laminated HEP Person educated: Patient, spouse Education method: Explanation Education comprehension: verbalized understanding   HOME EXERCISE PROGRAM: [id number]  ASSESSMENT:  CLINICAL IMPRESSION:  Pt able to continue with progression of land based balance and lumbopelvic strength exercise at today's session. Pt without increase in pain or significant LOB. Pt does  have difficulty with slow eccentric and maintaining postural stability with resisted STS, plan to revisit at next. Pt able to perform hip extension with power focus at today's session when given cuing. Pt was able to update HEP at this time. Pt also able to add in reactive stepping exercise today without LOB. Plan to continue with land based strength and dynamic balance as able. Pt will continue to benefit from skilled PT to improve functional mobility and safety returning pt to PLOF.    OBJECTIVE IMPAIRMENTS Abnormal gait, decreased activity tolerance, decreased balance, decreased coordination, decreased endurance, decreased mobility, difficulty walking, decreased ROM, decreased strength, postural dysfunction, and pain.   ACTIVITY LIMITATIONS carrying, lifting, bending, stairs, transfers, and dressing  PARTICIPATION LIMITATIONS: meal prep, cleaning, laundry, driving, shopping, community activity, and yard work  PERSONAL FACTORS  Age, Past/current experiences, and 1-2 comorbidities: CP, OA, Lumbar stenosis  are also affecting patient's functional outcome.   REHAB POTENTIAL: Good  CLINICAL DECISION MAKING: Evolving/moderate complexity  EVALUATION COMPLEXITY: Moderate   GOALS: Goals reviewed with patient? Yes  SHORT TERM GOALS: Target date: 10/29/21 Max pain to decrease to <6/10 with centralization of sx. Baseline: continues to have radicular symptoms with walking and transitions; max pain 6-8/10 Goal status: Ongoing  2.  Pt to tolerate standing and walking up towards 15 minutes Baseline: 1 hr, per pt and husbands report Goal status: Achieved -11/08/21  3.  Pt will improve on the TUG to <or= 15s  to demonstrate improved balance and amb ability. Baseline: 14.22s Goal status: Achieved - 11/08/21  4.  Pt will improve on 5 x STS to <or=15s to demonstrate improved  strength Baseline: 9.78s Goal status: Achieved - 11/08/21  5.  Pt will return to driving to demonstrate return to PLOF Baseline:   Goal status: Achieved - verbalized it happened at least 3 wks ago, 11/08/21    LONG TERM GOALS: Target date: 12/03/21  Foto to improve to stated goal Baseline: 49-  11th visit Goal status: ongoing  2.  Pt to improve Berg to >or= 45/56 to demonstrate decreased fall risk. Baseline: 34/56 -11/13/21 Goal status: ongoing  3.  Pt to report no increase in pain sx with ambulation up towards 30 minutes Baseline: 8/10:  slight increase 9/12 Goal status: ongoing  4.  Pt to return to house hold chores  with management of pain to demonstrate return to PLOF Baseline: unable to complete Goal status: ongoing    PLAN: PT FREQUENCY: 1-2x/week  PT DURATION: 8 weeks  PLANNED INTERVENTIONS: Therapeutic exercises, Therapeutic activity, Neuromuscular re-education, Balance training, Gait training, Patient/Family education, Self Care, Joint mobilization, Joint manipulation, Stair training, Aquatic Therapy, Dry Needling, Spinal manipulation, Spinal mobilization, Cryotherapy, Moist heat, Taping, Ultrasound, and Manual therapy  PLAN FOR NEXT SESSION: alternating land and aquatics for stretching/strengthening of core, balance retraining  Daleen Bo PT, DPT 11/25/21 11:41 AM  [place], [place], [address] Phone: [phone]   Fax:  [phone]

## 2021-11-26 ENCOUNTER — Encounter (HOSPITAL_BASED_OUTPATIENT_CLINIC_OR_DEPARTMENT_OTHER): Payer: Self-pay | Admitting: Physical Therapy

## 2021-11-26 ENCOUNTER — Ambulatory Visit (HOSPITAL_BASED_OUTPATIENT_CLINIC_OR_DEPARTMENT_OTHER): Payer: PPO | Admitting: Physical Therapy

## 2021-11-26 DIAGNOSIS — M6281 Muscle weakness (generalized): Secondary | ICD-10-CM | POA: Diagnosis not present

## 2021-11-26 DIAGNOSIS — R2681 Unsteadiness on feet: Secondary | ICD-10-CM

## 2021-11-26 DIAGNOSIS — R269 Unspecified abnormalities of gait and mobility: Secondary | ICD-10-CM

## 2021-11-26 NOTE — Therapy (Signed)
OUTPATIENT PHYSICAL THERAPY LOWER EXTREMITY TREATMENT NOTE  Recert   Patient Name: Holly Mcconnell MRN: [id number] DOB:1946-02-25, 76 y.o., female Today's Date: 11/26/2021   PT End of Session - 11/26/21 1032     Visit Number 14    Number of Visits 16    Date for PT Re-Evaluation 12/10/21    Authorization Type [employer]    PT Start Time 1030    PT Stop Time 1115    PT Time Calculation (min) 45 min    Activity Tolerance Patient tolerated treatment well    Behavior During Therapy WFL for tasks assessed/performed                 Past Medical History:  Diagnosis Date   Cerebral palsy ([place])    Mild symptoms.   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   Paroxysmal tachycardia, unspecified ([place]) 03/17/2018   Cerebral palsy ([place]) 03/01/2018   Lumbar radiculopathy 03/01/2018   Spinal stenosis of lumbar region with neurogenic claudication 03/01/2018   Radiculopathy due to lumbar intervertebral disc disorder 03/01/2018   Migraine headache    High cholesterol 06/11/2003    PCP: Cari Caraway, MD  REFERRING PROVIDER: Cari Caraway, MD  REFERRING DIAG:  G80.9 (ICD-10-CM) - Cerebral palsy, unspecified  R26.89 (ICD-10-CM) - Other abnormalities of gait and mobility    THERAPY DIAG:  Muscle weakness (generalized)  Abnormality of gait and mobility  Unsteadiness on feet  Rationale for Evaluation and Treatment Rehabilitation  ONSET DATE: 3 months  SUBJECTIVE:     SUBJECTIVE STATEMENT: Pt reports good response last session. Continues compliance with aquatic and on land HEP    PERTINENT HISTORY: Spinal  stenosis of lumbar region with neurogenic claudication   Spondylosis of lumbar    Chronic pain syndrome CP    PAIN:  Are you having pain? No: NPRS scale: 6/10 Pain location: lower back Pain description: radiation/constant with standing LB Aggravating factors: standing walking Relieving factors: sitting/lying down   PRECAUTIONS: Fall; 10/22/21: no more than 20 lb lifting  WEIGHT BEARING RESTRICTIONS No  FALLS:  Has patient fallen in last 6 months? No  LIVING ENVIRONMENT: Lives with: lives with their family Lives in: House/apartment Stairs: No Has following equipment at home: Grab bars and rollator  OCCUPATION: Retired  PLOF: Independent with community mobility with device  PATIENT GOALS Pt wants to be able to complete house hold chores, drive   OBJECTIVE:  PATIENT SURVEYS:  FOTO Risk adjusted: 48 with goal 55  FOTO 11th: 49 pts  LOWER EXTREMITY MMT:  MMT Right eval Left eval Right/Left 11/14/21  Hip flexion 3 3 3+/ 4  Hip extension     Hip abduction 4 4 5-/5-  Hip adduction 5 5 5/5  Hip internal rotation     Hip external rotation     Knee flexion 4 4 5-/5  Knee extension 4 4 5/5  Ankle dorsiflexion     Ankle plantarflexion     Ankle inversion     Ankle eversion      (Blank rows = not tested)  FUNCTIONAL TESTS:  5 times sit to stand: 19.50 Timed up and go (TUG): 23.07 Merrilee Jansky  Balance Scale: 26/56  9/14 5 X STS:12.43 TUG:12.46                       Berg:34  GAIT: Distance walked: 500 Assistive device utilized: Walker - 4 wheeled Level of assistance: Complete Independence Comments: forward flexed position, increased cadence  TODAY'S TREATMENT:  Pt seen for aquatic therapy today.  Treatment took place in water 3.25-4.5 ft in depth at the [facility]. Temp of water was 92.  Pt entered/exited the pool via steps using side step pattern with bilat ue on 1 handrail rail, independently.  with SBA of husband using white barbell: *walking  forward, back and side stepping multiple widths  *  STS on bench with blue step with add set BB 2x5 *STS from 3rd step (unable to complete on 4th) x 5 cues for weigth shift and momentum and min-cga *SLS ue support on barbell *Stork stance with IR/ER hips  x1o R/L. ue supported on wall * SLS with 3 way toe taps x 10 each; bilat heel raises; hip circles CW/ CCW * Staggered stance with barbell push/pull Leading ea LE 2x20 * at stairs, unilateral UE on rail - R/L forward step up x 10; Lt forward step down/Rt retro step up x 10 R/L *straddling yellow noodle: cycling; add/abd   Pt requires the buoyancy and hydrostatic pressure of water for support, and to offload joints by unweighting joint load by at least 50 % in navel deep water and by at least 75-80% in chest to neck deep water.  Viscosity of the water is needed for resistance of strengthening. Water current perturbations provides challenge to standing balance requiring increased core activation.  9/25  Nustep L3 5 min   Paloff press RTB 2x10 with NBOS Tandem on airex 30s 3x each Sidestepping YTB ankles at table 4x laps with UE Standing lunge 2x10 each side with UE  Resisted STS with YTB at hips 5x5 (focus on slow eccentric lower, posture, hip extension, power with eccentric)    PATIENT EDUCATION:  Education details: updated laminated HEP Person educated: Patient, spouse Education method: Explanation Education comprehension: verbalized understanding   HOME EXERCISE PROGRAM: [id number]  ASSESSMENT:  CLINICAL IMPRESSION:  Further progressed aquatic based strengthening and balance.  Increased challenge with STS from less submerged position.  She has made significant gains in balance, strength ( see above chart) and functional mobility meeting most goals at this point. She has had an improvement in all objective testing as noted in above charts. Knee pain has reduced to 0-1/10 and back pain is controlled but continues.  She will benefit  from continued skilled PT for 1 more aquatic and 1 more land session to finalize HEP and its progression.     Pt able to continue with progression of land based balance and lumbopelvic strength exercise at today's session. Pt without increase in pain or significant LOB. Pt does have difficulty with slow eccentric and maintaining postural stability with resisted STS, plan to revisit at next. Pt able to perform hip extension with power focus at today's session when given cuing. Pt was able to update HEP at this time. Pt also able to add in reactive stepping exercise today without LOB. Plan to continue with land based strength and dynamic balance as able. Pt will continue to benefit from skilled PT to improve functional mobility and safety returning pt to PLOF.    OBJECTIVE IMPAIRMENTS Abnormal gait, decreased activity tolerance, decreased balance, decreased coordination, decreased endurance,  decreased mobility, difficulty walking, decreased ROM, decreased strength, postural dysfunction, and pain.   ACTIVITY LIMITATIONS carrying, lifting, bending, stairs, transfers, and dressing  PARTICIPATION LIMITATIONS: meal prep, cleaning, laundry, driving, shopping, community activity, and yard work  PERSONAL FACTORS Age, Past/current experiences, and 1-2 comorbidities: CP, OA, Lumbar stenosis  are also affecting patient's functional outcome.   REHAB POTENTIAL: Good  CLINICAL DECISION MAKING: Evolving/moderate complexity  EVALUATION COMPLEXITY: Moderate   GOALS: Goals reviewed with patient? Yes  SHORT TERM GOALS: Target date: 10/29/21 Max pain to decrease to <6/10 with centralization of sx. Baseline: continues to have radicular symptoms with walking and transitions; max pain 6-8/10 Goal status: Ongoing  2.  Pt to tolerate standing and walking up towards 15 minutes Baseline: 1 hr, per pt and husbands report Goal status: Achieved -11/08/21  3.  Pt will improve on the TUG to <or= 15s  to demonstrate  improved balance and amb ability. Baseline: 14.22s Goal status: Achieved - 11/08/21  4.  Pt will improve on 5 x STS to <or=15s to demonstrate improved  strength Baseline: 9.78s Goal status: Achieved - 11/08/21  5.  Pt will return to driving to demonstrate return to PLOF Baseline:  Goal status: Achieved - verbalized it happened at least 3 wks ago, 11/08/21    LONG TERM GOALS: Target date: 12/03/21  Foto to improve to stated goal Baseline: 49-  11th visit Goal status: ongoing  2.  Pt to improve Berg to >or= 45/56 to demonstrate decreased fall risk. Baseline: 34/56 -11/13/21 Goal status: ongoing  3.  Pt to report no increase in pain sx with ambulation up towards 30 minutes Baseline: 8/10:  slight increase 9/12 Goal status: ongoing  4.  Pt to return to house hold chores  with management of pain to demonstrate return to PLOF Baseline: unable to complete Goal status: ongoing    PLAN: PT FREQUENCY: 1x week  PT DURATION: 2 weeks  PLANNED INTERVENTIONS: Therapeutic exercises, Therapeutic activity, Neuromuscular re-education, Balance training, Gait training, Patient/Family education, Self Care, Joint mobilization, Joint manipulation, Stair training, Aquatic Therapy, Dry Needling, Spinal manipulation, Spinal mobilization, Cryotherapy, Moist heat, Taping, Ultrasound, and Manual therapy  PLAN FOR NEXT SESSION: alternating land and aquatics for stretching/strengthening of core, balance retraining  Annamarie Major) Mylo Choi MPT 11/26/21 6:57 PM  [facility] [address] [place], [place], [address] Phone: [phone]   Fax:  [phone]

## 2021-11-28 ENCOUNTER — Ambulatory Visit (HOSPITAL_BASED_OUTPATIENT_CLINIC_OR_DEPARTMENT_OTHER): Payer: PPO | Admitting: Physical Therapy

## 2021-11-28 ENCOUNTER — Encounter (HOSPITAL_BASED_OUTPATIENT_CLINIC_OR_DEPARTMENT_OTHER): Payer: Self-pay | Admitting: Physical Therapy

## 2021-11-28 DIAGNOSIS — M6281 Muscle weakness (generalized): Secondary | ICD-10-CM

## 2021-11-28 DIAGNOSIS — R269 Unspecified abnormalities of gait and mobility: Secondary | ICD-10-CM

## 2021-11-28 DIAGNOSIS — R2681 Unsteadiness on feet: Secondary | ICD-10-CM

## 2021-11-28 NOTE — Therapy (Signed)
OUTPATIENT PHYSICAL THERAPY LOWER EXTREMITY TREATMENT NOTE   Patient Name: Holly Mcconnell MRN: [id number] DOB:15-Jan-1946, 76 y.o., female Today's Date: 11/28/2021   PT End of Session - 11/28/21 1126     Visit Number 15    Number of Visits 16    Date for PT Re-Evaluation 12/10/21    Authorization Type [employer]    PT Start Time 1117    PT Stop Time 1155    PT Time Calculation (min) 38 min    Activity Tolerance Patient tolerated treatment well    Behavior During Therapy WFL for tasks assessed/performed                 Past Medical History:  Diagnosis Date   Cerebral palsy ([place])    Mild symptoms.   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   Paroxysmal tachycardia, unspecified ([place]) 03/17/2018   Cerebral palsy ([place]) 03/01/2018   Lumbar radiculopathy 03/01/2018   Spinal stenosis of lumbar region with neurogenic claudication 03/01/2018   Radiculopathy due to lumbar intervertebral disc disorder 03/01/2018   Migraine headache    High cholesterol 06/11/2003    PCP: Cari Caraway, MD  REFERRING PROVIDER: Cari Caraway, MD  REFERRING DIAG:  G80.9 (ICD-10-CM) - Cerebral palsy, unspecified  R26.89 (ICD-10-CM) - Other abnormalities of gait and mobility    THERAPY DIAG:  Muscle weakness (generalized)  Abnormality of gait and mobility  Unsteadiness on feet  Rationale for Evaluation and Treatment Rehabilitation  ONSET DATE: 3 months  SUBJECTIVE:     SUBJECTIVE STATEMENT: Pt is accompanied by husband.  She said she is pleasantly surprised that she has no pain today.  Pt and husband have been going to [facility] and  completing aquatic HEP.    PERTINENT HISTORY: Spinal stenosis of lumbar region with neurogenic claudication   Spondylosis of lumbar    Chronic pain syndrome CP    PAIN:  Are you having pain? No: NPRS scale: 0/10 Pain location:  Pain description:  Aggravating factors: standing walking Relieving factors: sitting/lying down   PRECAUTIONS: Fall; 10/22/21: no more than 20 lb lifting  WEIGHT BEARING RESTRICTIONS No  FALLS:  Has patient fallen in last 6 months? No  LIVING ENVIRONMENT: Lives with: lives with their family Lives in: House/apartment Stairs: No Has following equipment at home: Grab bars and rollator  OCCUPATION: Retired  PLOF: Independent with community mobility with device  PATIENT GOALS Pt wants to be able to complete house hold chores, drive   OBJECTIVE:  PATIENT SURVEYS:  FOTO Risk adjusted: 48 with goal 55  FOTO 11th: 49 pts  LOWER EXTREMITY MMT:  MMT Right eval Left eval Right/Left 11/14/21  Hip flexion 3 3 3+/ 4  Hip extension     Hip abduction 4 4 5-/5-  Hip adduction 5 5 5/5  Hip internal rotation     Hip external rotation     Knee flexion 4 4 5-/5  Knee extension 4 4 5/5  Ankle dorsiflexion     Ankle plantarflexion     Ankle inversion     Ankle eversion      (Blank rows = not tested)  FUNCTIONAL TESTS:  5 times  sit to stand: 19.50 Timed up and go (TUG): 23.07 Berg Balance Scale: 26/56  9/14 5 X STS:12.43 TUG:12.46                       Berg:34  GAIT: Distance walked: 500 Assistive device utilized: Walker - 4 wheeled Level of assistance: Complete Independence Comments: forward flexed position, increased cadence  TODAY'S TREATMENT:  Pt seen for aquatic therapy today.  Treatment took place in water 3.25-4.5 ft in depth at the [facility]. Temp of water was 92.  Pt entered/exited the pool via steps using side step pattern with bilat ue on 1 handrail rail, independently.  with SBA of husband using white  barbell: *walking forward, back and side stepping multiple widths  * near wall holding rainbow hand buoys:  bilat shoulder horiz abdct/add x 10; shoulder add x 10; unilateral flex/ext to neutral x 10 each * holding white barbell:  SLS "flamingo" with hip IR/ER x 5 x 2 (light CGA to barbell from husband)  * return to walking forward  * Tandem gait (as narrow as tolerated) forward/ backward * SLS with 3 way toe tap; 3 way LE kick *straddling yellow noodle holding yellow hand buoys: cycling * forward step ups x 10R x 10L * STS on 4th step with HHA from husband x 5   Pt requires the buoyancy and hydrostatic pressure of water for support, and to offload joints by unweighting joint load by at least 50 % in navel deep water and by at least 75-80% in chest to neck deep water.  Viscosity of the water is needed for resistance of strengthening. Water current perturbations provides challenge to standing balance requiring increased core activation.  9/25  Nustep L3 5 min   Paloff press RTB 2x10 with NBOS Tandem on airex 30s 3x each Sidestepping YTB ankles at table 4x laps with UE Standing lunge 2x10 each side with UE  Resisted STS with YTB at hips 5x5 (focus on slow eccentric lower, posture, hip extension, power with eccentric)    PATIENT EDUCATION:  Education details: updated laminated HEP Person educated: Patient, spouse Education method: Explanation Education comprehension: verbalized understanding   HOME EXERCISE PROGRAM: [id number]  ASSESSMENT:  CLINICAL IMPRESSION:  Pt arrived pain-free and remained pain-free during session.  Encouraged pt to not stand too long in SLS exercises, to avoid straining back; had pt walk a lap in between 2-3 static standing exercises with good tolerance.   She will benefit from continued skilled PT  1 more land session to finalize HEP and its progression.  PT to assess goals (including BERG/ FOTO) next visit for d/c.     OBJECTIVE IMPAIRMENTS Abnormal  gait, decreased activity tolerance, decreased balance, decreased coordination, decreased endurance, decreased mobility, difficulty walking, decreased ROM, decreased strength, postural dysfunction, and pain.   ACTIVITY LIMITATIONS carrying, lifting, bending, stairs, transfers, and dressing  PARTICIPATION LIMITATIONS: meal prep, cleaning, laundry, driving, shopping, community activity, and yard work  PERSONAL FACTORS Age, Past/current experiences, and 1-2 comorbidities: CP, OA, Lumbar stenosis  are also affecting patient's functional outcome.   REHAB POTENTIAL: Good  CLINICAL DECISION MAKING: Evolving/moderate complexity  EVALUATION COMPLEXITY: Moderate   GOALS: Goals reviewed with patient? Yes  SHORT TERM GOALS: Target date: 10/29/21 Max pain to decrease to <6/10 with centralization of sx. Baseline: continues to have radicular symptoms with walking and transitions; max pain 6-8/10 Goal status: Ongoing  2.  Pt to tolerate standing and walking up towards 15 minutes Baseline:  1 hr, per pt and husbands report Goal status: Achieved -11/08/21  3.  Pt will improve on the TUG to <or= 15s  to demonstrate improved balance and amb ability. Baseline: 14.22s Goal status: Achieved - 11/08/21  4.  Pt will improve on 5 x STS to <or=15s to demonstrate improved  strength Baseline: 9.78s Goal status: Achieved - 11/08/21  5.  Pt will return to driving to demonstrate return to PLOF Baseline:  Goal status: Achieved - verbalized it happened at least 3 wks ago, 11/08/21    LONG TERM GOALS: Target date: 12/03/21  Foto to improve to stated goal Baseline: 49-  11th visit Goal status: ongoing  2.  Pt to improve Berg to >or= 45/56 to demonstrate decreased fall risk. Baseline: 34/56 -11/13/21 Goal status: ongoing  3.  Pt to report no increase in pain sx with ambulation up towards 30 minutes Baseline: 6-7/10 - 9/28 Goal status: ongoing  4.  Pt to return to house hold chores  with management of pain to  demonstrate return to PLOF Baseline:  Goal status: Achieved - 9/28    PLAN: PT FREQUENCY: 1x week  PT DURATION: 2 weeks  PLANNED INTERVENTIONS: Therapeutic exercises, Therapeutic activity, Neuromuscular re-education, Balance training, Gait training, Patient/Family education, Self Care, Joint mobilization, Joint manipulation, Stair training, Aquatic Therapy, Dry Needling, Spinal manipulation, Spinal mobilization, Cryotherapy, Moist heat, Taping, Ultrasound, and Manual therapy  PLAN FOR NEXT SESSION:  see above and d/c after next visit.   Kerin Perna, PTA 11/28/21 11:59 AM [facility] [address] [place], [place], [address] Phone: [phone]   Fax:  [phone]

## 2021-11-29 DIAGNOSIS — M545 Low back pain, unspecified: Secondary | ICD-10-CM | POA: Diagnosis not present

## 2021-12-10 ENCOUNTER — Encounter (HOSPITAL_BASED_OUTPATIENT_CLINIC_OR_DEPARTMENT_OTHER): Payer: Self-pay | Admitting: Physical Therapy

## 2021-12-10 ENCOUNTER — Ambulatory Visit (HOSPITAL_BASED_OUTPATIENT_CLINIC_OR_DEPARTMENT_OTHER): Payer: PPO | Attending: Family Medicine | Admitting: Physical Therapy

## 2021-12-10 DIAGNOSIS — M6281 Muscle weakness (generalized): Secondary | ICD-10-CM | POA: Diagnosis not present

## 2021-12-10 DIAGNOSIS — R269 Unspecified abnormalities of gait and mobility: Secondary | ICD-10-CM | POA: Diagnosis not present

## 2021-12-10 DIAGNOSIS — R2681 Unsteadiness on feet: Secondary | ICD-10-CM | POA: Insufficient documentation

## 2021-12-10 DIAGNOSIS — G809 Cerebral palsy, unspecified: Secondary | ICD-10-CM | POA: Diagnosis not present

## 2021-12-10 DIAGNOSIS — E663 Overweight: Secondary | ICD-10-CM | POA: Diagnosis not present

## 2021-12-10 DIAGNOSIS — E782 Mixed hyperlipidemia: Secondary | ICD-10-CM | POA: Diagnosis not present

## 2021-12-10 NOTE — Therapy (Signed)
OUTPATIENT PHYSICAL THERAPY LOWER EXTREMITY TREATMENT NOTE  PHYSICAL THERAPY DISCHARGE SUMMARY  Visits from Start of Care: 16  Plan: Patient agrees to discharge.  Patient goals were met. Patient is being discharged due to meeting the stated rehab goals.         Patient Name: Holly Mcconnell MRN: [id number] DOB:03-Sep-1945, 76 y.o., female Today's Date: 12/10/2021   PT End of Session - 12/10/21 0927     Visit Number 16    Number of Visits 16    Date for PT Re-Evaluation 12/10/21    Authorization Type [employer]    PT Start Time 0845    PT Stop Time 0925    PT Time Calculation (min) 40 min    Activity Tolerance Patient tolerated treatment well    Behavior During Therapy [facility] for tasks assessed/performed                  Past Medical History:  Diagnosis Date   Cerebral palsy ([place])    Mild symptoms.   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   Paroxysmal tachycardia, unspecified ([place]) 03/17/2018   Cerebral palsy ([place]) 03/01/2018   Lumbar radiculopathy 03/01/2018   Spinal stenosis of lumbar region with neurogenic claudication 03/01/2018   Radiculopathy due to lumbar intervertebral disc disorder 03/01/2018   Migraine headache    High cholesterol 06/11/2003    PCP: Cari Caraway, MD  REFERRING PROVIDER: Cari Caraway, MD  REFERRING DIAG:  G80.9 (ICD-10-CM) - Cerebral palsy, unspecified  R26.89 (ICD-10-CM) - Other abnormalities of gait and mobility    THERAPY DIAG:  Muscle weakness (generalized)  Abnormality of gait and mobility  Unsteadiness on feet  Rationale for Evaluation and Treatment  Rehabilitation  ONSET DATE: 3 months  SUBJECTIVE:     SUBJECTIVE STATEMENT: Pt is accompanied by husband.  She said she is pleasantly surprised that she has no pain today.  Pt and husband have been going to [facility] and completing aquatic HEP.    PERTINENT HISTORY: Spinal stenosis of lumbar region with neurogenic claudication   Spondylosis of lumbar    Chronic pain syndrome CP    PAIN:  Are you having pain? No: NPRS scale: 0/10 Pain location:  Pain description:  Aggravating factors: standing walking Relieving factors: sitting/lying down   PRECAUTIONS: Fall; 10/22/21: no more than 20 lb lifting  WEIGHT BEARING RESTRICTIONS No  FALLS:  Has patient fallen in last 6 months? No  LIVING ENVIRONMENT: Lives with: lives with their family Lives in: House/apartment Stairs: No Has following equipment at home: Grab bars and rollator  OCCUPATION: Retired  PLOF: Independent with community mobility with device  PATIENT GOALS Pt wants to be able to complete house hold chores, drive   OBJECTIVE:  PATIENT SURVEYS:  FOTO Risk adjusted: 48 with goal 55  FOTO 11th: 49 pts  FOTO 68 @ DC   FUNCTIONAL TESTS:  5 times sit to stand: 19.50 Timed up and go (TUG): 23.07 Berg Balance Scale: 26/56  9/14 5 X STS:12.43 TUG:12.46                       Berg:34  10/10 Berg 49/56  5xSTS: <10s TUG: 10.5s   GAIT: Distance walked: 500 Assistive device utilized: Walker - 4 wheeled Level of assistance: Complete Independence Comments: forward flexed position, increased cadence  TODAY'S TREATMENT:  10/10   Exercises - Sit to Stand with Resistance Around Legs  - 1 x daily - 2-3 x weekly - 3 sets - 8 reps - Side Stepping with Resistance at Ankles and Counter Support  - 1 x daily - 2-3 x weekly - 1 sets - 4 reps - 30f hold - Forward and Backward Monster Walk with Resistance at Ankles and Counter Support  - 1 x daily - 2-3 x weekly - 1 sets - 4 reps - 148fhold - Standing Anti-Rotation  Press with Anchored Resistance  - 1 x daily - 2-3 x weekly - 3 sets - 10 reps - Side Lunge with Counter Support  - 1 x daily - 2-3 x weekly - 2 sets - 10 reps - Standing Tandem Balance with Unilateral Counter Support  - 1 x daily - 7 x weekly - 3 sets - 10 reps - Tandem Walking  - 1 x daily - 7 x weekly - 3 sets - 10 reps - Single Leg Stance  - 1 x daily - 7 x weekly - 3 sets - 10 reps - Heel Toe Raises at Pool Wall  - 1 x daily - 7 x weekly - 3 sets - 10 reps - Standing Hip Abduction Adduction at Pool Wall  - 1 x daily - 7 x weekly - 3 sets - 10 reps - Standing Hip Flexion Extension at [facility]- 1 x daily - 7 x weekly - 3 sets - 10 reps - Bilateral Shoulder Horizontal Abduction Adduction AROM  - 1 x daily - 7 x weekly - 3 sets - 10 reps - Bilateral Shoulder Flexion Extension with Hand Floats at [facility]- 1 x daily - 7 x weekly - 3 sets - 10 reps - Asymmetrical Shoulder Abduction Adduction AROM at Pool Wall  - 1 x daily - 7 x weekly - 3 sets - 10 reps  Previous:  Pt seen for aquatic therapy today.  Treatment took place [place] 3.25[place]4.[place]  Pt entered/exited the pool via steps using side step pattern with bilat ue on 1 handrail rail, independently.  with SBA of husband using white barbell: *walking forward, back and side stepping multiple widths  * near wall holding rainbow hand buoys:  bilat shoulder horiz abdct/add x 10; shoulder add x 10; unilateral flex/ext to neutral x 10 each * holding white barbell:  SLS "flamingo" with hip IR/ER x 5 x 2 (light CGA to barbell from husband)  * return to walking forward  * Tandem gait (as narrow as tolerated) forward/ backward * SLS with 3 way toe tap; 3 way LE kick *straddling yellow noodle holding yellow hand buoys: cycling * forward step ups x 10R x 10L * STS on 4th step with HHA from husband x 5   Pt requires the buoyancy and hydrostatic pressure of water for support, and to  offload joints by unweighting joint load by at least 50 % in navel deep water and by at least 75-80% in chest to neck deep water.  Viscosity of the water is needed for resistance of strengthening. Water current perturbations provides challenge to standing balance requiring increased core activation.  9/25  Nustep L3 5 min  Paloff press RTB 2x10 with NBOS Tandem on airex 30s 3x each Sidestepping YTB ankles at table 4x laps with UE Standing lunge 2x10 each side with UE  Resisted STS with YTB at hips 5x5 (focus on slow eccentric lower, posture, hip extension, power with eccentric)    PATIENT EDUCATION:  Education details: exam results, safety with gym exercise, graded exposure, anatomy, exercise progression, HEP, POC  Person educated: Patient, spouse Education method: Explanation Education comprehension: verbalized understanding   HOME EXERCISE PROGRAM: [id number]  ASSESSMENT:  CLINICAL IMPRESSION:  Pt has met all functional goals at this time and has progressed very well with therapy. Pt has improved balance, LE strength, and has good understanding of graded exposure to exercise at the gym. Pt given edu about safety at the gym and to use discretion with avoiding excessively heavy, impact, and high velocity activity. Pt to receive referral to [facility] personal training for trial period and slow transition from therapy to independent exercise. Pt with no further needs for skilled therapy at this time. Pt with good understanding and support from spouse at home. D/C this episode.    OBJECTIVE IMPAIRMENTS Abnormal gait, decreased activity tolerance, decreased balance, decreased coordination, decreased endurance, decreased mobility, difficulty walking, decreased ROM, decreased strength, postural dysfunction, and pain.   ACTIVITY LIMITATIONS carrying, lifting, bending, stairs, transfers, and dressing  PARTICIPATION LIMITATIONS: meal prep, cleaning, laundry, driving, shopping, community  activity, and yard work  PERSONAL FACTORS Age, Past/current experiences, and 1-2 comorbidities: CP, OA, Lumbar stenosis  are also affecting patient's functional outcome.   REHAB POTENTIAL: Good  CLINICAL DECISION MAKING: Evolving/moderate complexity  EVALUATION COMPLEXITY: Moderate   GOALS: Goals reviewed with patient? Yes  SHORT TERM GOALS: Target date: 10/29/21 Max pain to decrease to <6/10 with centralization of sx. Baseline: continues to have radicular symptoms with walking and transitions; max pain 6-8/10 Goal status: Ongoing  2.  Pt to tolerate standing and walking up towards 15 minutes Baseline: 1 hr, per pt and husbands report Goal status: Achieved -11/08/21  3.  Pt will improve on the TUG to <or= 15s  to demonstrate improved balance and amb ability. Baseline: 14.22s Goal status: Achieved - 11/08/21  4.  Pt will improve on 5 x STS to <or=15s to demonstrate improved  strength Baseline: 9.78s Goal status: Achieved - 11/08/21  5.  Pt will return to driving to demonstrate return to PLOF Baseline:  Goal status: Achieved - verbalized it happened at least 3 wks ago, 11/08/21    LONG TERM GOALS: Target date: 12/03/21  Foto to improve to stated goal Baseline: 49-  11th visit Goal status: MET  2.  Pt to improve Berg to >or= 45/56 to demonstrate decreased fall risk. Baseline: 34/56 -11/13/21 Goal status: MET  3.  Pt to report no increase in pain sx with ambulation up towards 30 minutes Baseline: 6-7/10 - 9/28 Goal status: MET  4.  Pt to return to house hold chores  with management of pain to demonstrate return to PLOF Baseline:  Goal status: Achieved - 9/28    PLAN: PT FREQUENCY: 1x week  PT DURATION: 2 weeks  PLANNED INTERVENTIONS: Therapeutic exercises, Therapeutic activity, Neuromuscular re-education, Balance training, Gait training, Patient/Family education, Self Care, Joint mobilization, Joint manipulation, Stair training, Aquatic Therapy, Dry Needling, Spinal  manipulation, Spinal mobilization, Cryotherapy, Moist heat, Taping, Ultrasound, and Manual therapy   Daleen Bo PT, DPT 12/10/21 9:28 AM

## 2021-12-12 ENCOUNTER — Ambulatory Visit (HOSPITAL_BASED_OUTPATIENT_CLINIC_OR_DEPARTMENT_OTHER): Payer: PPO | Admitting: Physical Therapy

## 2021-12-16 DIAGNOSIS — H04123 Dry eye syndrome of bilateral lacrimal glands: Secondary | ICD-10-CM | POA: Diagnosis not present

## 2021-12-16 DIAGNOSIS — Z961 Presence of intraocular lens: Secondary | ICD-10-CM | POA: Diagnosis not present

## 2021-12-16 DIAGNOSIS — G43809 Other migraine, not intractable, without status migrainosus: Secondary | ICD-10-CM | POA: Diagnosis not present

## 2021-12-16 DIAGNOSIS — H11042 Peripheral pterygium, stationary, left eye: Secondary | ICD-10-CM | POA: Diagnosis not present

## 2021-12-16 DIAGNOSIS — H1849 Other corneal degeneration: Secondary | ICD-10-CM | POA: Diagnosis not present

## 2021-12-19 ENCOUNTER — Other Ambulatory Visit: Payer: Self-pay | Admitting: Family Medicine

## 2021-12-23 DIAGNOSIS — M533 Sacrococcygeal disorders, not elsewhere classified: Secondary | ICD-10-CM | POA: Diagnosis not present

## 2021-12-24 ENCOUNTER — Ambulatory Visit
Admission: RE | Admit: 2021-12-24 | Discharge: 2021-12-24 | Disposition: A | Discharge status: Home / Self Care | Payer: PPO | Source: Ambulatory Visit | Attending: Family Medicine | Admitting: Family Medicine

## 2021-12-24 DIAGNOSIS — Z1231 Encounter for screening mammogram for malignant neoplasm of breast: Secondary | ICD-10-CM

## 2021-12-24 DIAGNOSIS — M8589 Other specified disorders of bone density and structure, multiple sites: Secondary | ICD-10-CM | POA: Diagnosis not present

## 2021-12-24 DIAGNOSIS — E890 Postprocedural hypothyroidism: Secondary | ICD-10-CM | POA: Diagnosis not present

## 2021-12-24 DIAGNOSIS — M85852 Other specified disorders of bone density and structure, left thigh: Secondary | ICD-10-CM

## 2021-12-24 DIAGNOSIS — Z780 Asymptomatic menopausal state: Secondary | ICD-10-CM | POA: Diagnosis not present

## 2022-01-08 DIAGNOSIS — E782 Mixed hyperlipidemia: Secondary | ICD-10-CM | POA: Diagnosis not present

## 2022-01-08 DIAGNOSIS — E663 Overweight: Secondary | ICD-10-CM | POA: Diagnosis not present

## 2022-01-08 DIAGNOSIS — G809 Cerebral palsy, unspecified: Secondary | ICD-10-CM | POA: Diagnosis not present

## 2022-01-08 DIAGNOSIS — Z6827 Body mass index (BMI) 27.0-27.9, adult: Secondary | ICD-10-CM | POA: Diagnosis not present

## 2022-01-09 DIAGNOSIS — M533 Sacrococcygeal disorders, not elsewhere classified: Secondary | ICD-10-CM | POA: Diagnosis not present

## 2022-02-03 DIAGNOSIS — M47816 Spondylosis without myelopathy or radiculopathy, lumbar region: Secondary | ICD-10-CM | POA: Diagnosis not present

## 2022-02-04 DIAGNOSIS — G809 Cerebral palsy, unspecified: Secondary | ICD-10-CM | POA: Diagnosis not present

## 2022-02-04 DIAGNOSIS — Z6828 Body mass index (BMI) 28.0-28.9, adult: Secondary | ICD-10-CM | POA: Diagnosis not present

## 2022-02-04 DIAGNOSIS — E782 Mixed hyperlipidemia: Secondary | ICD-10-CM | POA: Diagnosis not present

## 2022-02-04 DIAGNOSIS — E663 Overweight: Secondary | ICD-10-CM | POA: Diagnosis not present

## 2022-02-07 ENCOUNTER — Ambulatory Visit: Payer: PPO | Admitting: Cardiology

## 2022-02-10 ENCOUNTER — Ambulatory Visit: Payer: PPO | Admitting: Cardiology

## 2022-02-17 ENCOUNTER — Ambulatory Visit: Payer: PPO | Attending: Cardiology

## 2022-02-17 ENCOUNTER — Ambulatory Visit: Payer: PPO | Attending: Cardiology | Admitting: Cardiology

## 2022-02-17 ENCOUNTER — Encounter: Payer: Self-pay | Admitting: Cardiology

## 2022-02-17 VITALS — BP 118/68 | HR 70 | Ht 59.0 in | Wt 149.2 lb

## 2022-02-17 DIAGNOSIS — E785 Hyperlipidemia, unspecified: Secondary | ICD-10-CM

## 2022-02-17 DIAGNOSIS — R000 Tachycardia, unspecified: Secondary | ICD-10-CM

## 2022-02-17 DIAGNOSIS — R002 Palpitations: Secondary | ICD-10-CM

## 2022-02-17 DIAGNOSIS — R9431 Abnormal electrocardiogram [ECG] [EKG]: Secondary | ICD-10-CM | POA: Diagnosis not present

## 2022-02-17 NOTE — Progress Notes (Unsigned)
Enrolled for [employer] to mail a ZIO XT long term holter monitor to the patients address on file.  

## 2022-02-17 NOTE — Patient Instructions (Signed)
Medication Instructions:  Your physician recommends that you continue on your current medications as directed. Please refer to the Current Medication list given to you today.  *If you need a refill on your cardiac medications before your next appointment, please call your pharmacy*  Testing/Procedures: Your physician has requested that you have an echocardiogram. Echocardiography is a painless test that uses sound waves to create images of your heart. It provides your doctor with information about the size and shape of your heart and how well your heart's chambers and valves are working. This procedure takes approximately one hour. There are no restrictions for this procedure. Please do NOT wear cologne, perfume, aftershave, or lotions (deodorant is allowed). Please arrive 15 minutes prior to your appointment time.  ZIO XT- Long Term Monitor Instructions   Your physician has requested you wear a ZIO patch monitor for _14__ days.  This is a single patch monitor.   [employer] supplies one patch monitor per enrollment. Additional stickers are not available. Please do not apply patch if you will be having a Nuclear Stress Test, Echocardiogram, Cardiac CT, MRI, or Chest Xray during the period you would be wearing the monitor. The patch cannot be worn during these tests. You cannot remove and re-apply the ZIO XT patch monitor.  Your ZIO patch monitor will be sent Fed Ex from [employer] directly to your home address. It may take 3-5 days to receive your monitor after you have been enrolled.  Once you have received your monitor, please review the enclosed instructions. Your monitor has already been registered assigning a specific monitor serial # to you.  Billing and Patient Assistance Program Information   We have supplied [employer] with any of your insurance information on file for billing purposes. [employer] offers a sliding scale Patient Assistance Program for patients that do not have insurance,  or whose insurance does not completely cover the cost of the ZIO monitor.   You must apply for the Patient Assistance Program to qualify for this discounted rate.     To apply, please call [employer] at [phone], select option 4, then select option 2, and ask to apply for Patient Assistance Program.  Theodore Demark will ask your household income, and how many people are in your household.  They will quote your out-of-pocket cost based on that information.  [employer] will also be able to set up a 80-month interest-free payment plan if needed.  Applying the monitor   Shave hair from upper left chest.  Hold abrader disc by orange tab. Rub abrader in 40 strokes over the upper left chest as indicated in your monitor instructions.  Clean area with 4 enclosed alcohol pads. Let dry.  Apply patch as indicated in monitor instructions. Patch will be placed under collarbone on left side of chest with arrow pointing upward.  Rub patch adhesive wings for 2 minutes. Remove white label marked "1". Remove the white label marked "2". Rub patch adhesive wings for 2 additional minutes.  While looking in a mirror, press and release button in center of patch. A small green light will flash 3-4 times. This will be your only indicator that the monitor has been turned on. ?  Do not shower for the first 24 hours. You may shower after the first 24 hours.  Press the button if you feel a symptom. You will hear a small click. Record Date, Time and Symptom in the Patient Logbook.  When you are ready to remove the patch, follow instructions on the last  2 pages of the Patient Logbook. Stick patch monitor onto the last page of Patient Logbook.  Place Patient Logbook in the blue and white box.  Use locking tab on box and tape box closed securely.  The blue and white box has prepaid postage on it. Please place it in the mailbox as soon as possible. Your physician should have your test results approximately 7 days after the monitor has been  mailed back to [facility].  Call [facility] at [phone] if you have questions regarding your ZIO XT patch monitor. Call them immediately if you see an orange light blinking on your monitor.  If your monitor falls off in less than 4 days, contact our Monitor department at [phone]. ?If your monitor becomes loose or falls off after 4 days call [employer] at [phone] for suggestions on securing your monitor.?  Follow-Up: At [facility], you and your health needs are our priority.  As part of our continuing mission to provide you with exceptional heart care, we have created designated Provider Care Teams.  These Care Teams include your primary Cardiologist (physician) and Advanced Practice Providers (APPs -  Physician Assistants and Nurse Practitioners) who all work together to provide you with the care you need, when you need it.  We recommend signing up for the patient portal called "MyChart".  Sign up information is provided on this After Visit Summary.  MyChart is used to connect with patients for Virtual Visits (Telemedicine).  Patients are able to view lab/test results, encounter notes, upcoming appointments, etc.  Non-urgent messages can be sent to your provider as well.   To learn more about what you can do with MyChart, go to [email or web address].    Your next appointment:   6 month(s)  The format for your next appointment:   In Person  Provider:   Dr. Gardiner Rhyme

## 2022-02-17 NOTE — Progress Notes (Signed)
Cardiology Office Note:    Date:  02/17/2022   ID:  Holly Mcconnell, DOB 1945-04-16, MRN [id number]  PCP:  Cari Caraway, MD  Cardiologist:  Glenetta Hew, MD  Electrophysiologist:  None   Referring MD: Cari Caraway, MD   Chief Complaint  Patient presents with   Tachycardia    History of Present Illness:    Holly Mcconnell is a 76 y.o. female with a hx of hypertension, cerebral palsy who presents for follow-up.  She previously followed with Dr. Ellyn Hack, last seen 01/2021.  Wore 30-day monitor 03/2018, had episode of possible atach vs aflutter, lasted 10 minutes.  Suspecting likely atrial tachycardia and she was started on nadolol and has had no further palpitations.  Switched in 08/2021 to metoprolol.    Since last clinic visit, she reports that she has been doing well.  Has noted heart rate up to 160s when checks at home and watch has flagged for irregular rhythm.  Denies any chest pain, dyspnea, lightheadedness, syncope, lower extremity edema, or palpitations.   Past Medical History:  Diagnosis Date   Cerebral palsy (HCC)    Mild symptoms.   Migraine headache     Past Surgical History:  Procedure Laterality Date   CHOLECYSTECTOMY  1989   TEMPOROMANDIBULAR JOINT ARTHROPLASTY     Thyroid nodule resection  2005   TOTAL ABDOMINAL HYSTERECTOMY W/ BILATERAL SALPINGOOPHORECTOMY  1982    Current Medications: Current Meds  Medication Sig   metoprolol succinate (TOPROL-XL) 25 MG 24 hr tablet Take 25 mg by mouth daily.     Allergies:   Amoxicillin, Nabumetone, Other, Sulfa antibiotics, and Codeine   Social History   Socioeconomic History   Marital status: Married    Spouse name: Not on file   Number of children: 1   Years of education: Not on file   Highest education level: Some college, no degree  Occupational History   Not on file  Tobacco Use   Smoking status: Never   Smokeless tobacco: Never  Substance and Sexual Activity   Alcohol use: Never    Drug use: Never   Sexual activity: Not on file  Other Topics Concern   Not on file  Social History Narrative   She is retired.  Formerly worked in Insurance underwriter.  She is married (husband's name is Abbe Amsterdam -married in January 03, 1967).   They have 1 child and 8 biologic grandkids and one adopted grandkid.  She lives with her husband and is very active.     She does cardio-strength exercises with a trainer roughly 5 days a week for 30+ minutes at a time.      She walks with a walker and has some mild speech deficits and musculoskeletal deficits from history of cerebral palsy syndrome. -- >  Does have a handicap placard      Social Determinants of Radio broadcast assistant Strain: Not on file  Food Insecurity: Not on file  Transportation Needs: Not on file  Physical Activity: Not on file  Stress: Not on file  Social Connections: Not on file     Family History: The patient's family history includes Atrial fibrillation in her maternal grandfather and maternal uncle; Atrial fibrillation (age of onset: 42) in her mother; Breast cancer in her maternal grandmother; CAD in her mother; COPD in her mother; Colon cancer in her maternal grandmother; Hodgkin's lymphoma in her father; Hypercholesterolemia in her mother; Hyperlipidemia in her sister; Hypertension in her mother and sister; Hypothyroidism in her  mother; Lung cancer in her father; Pancreatic cancer in her sister; Stroke in her mother.  ROS:   Please see the history of present illness.     All other systems reviewed and are negative.  EKGs/Labs/Other Studies Reviewed:    The following studies were reviewed today:   EKG:   02/17/2022: Normal sinus rhythm, rate 70, low voltage, no ST abnormalities  Recent Labs: No results found for requested labs within last 365 days.  Recent Lipid Panel No results found for: "CHOL", "TRIG", "HDL", "CHOLHDL", "VLDL", "LDLCALC", "LDLDIRECT"  Physical Exam:    VS:  BP 118/68 (BP Location: Left Arm,  Patient Position: Sitting, Cuff Size: Normal)   Pulse 70   Ht '4\' 11"'$  (1.499 m)   Wt 149 lb 3.2 oz (67.7 kg)   SpO2 94%   BMI 30.13 kg/m     Wt Readings from Last 3 Encounters:  02/17/22 149 lb 3.2 oz (67.7 kg)  01/28/21 141 lb (64 kg)  01/10/20 135 lb 9.6 oz (61.5 kg)     GEN:  Well nourished, well developed in no acute distress HEENT: Normal NECK: No JVD; No carotid bruits LYMPHATICS: No lymphadenopathy CARDIAC: RRR, no murmurs, rubs, gallops RESPIRATORY:  Clear to auscultation without rales, wheezing or rhonchi  ABDOMEN: Soft, non-tender, non-distended MUSCULOSKELETAL:  No edema; No deformity  SKIN: Warm and dry NEUROLOGIC:  Alert and oriented x 3 PSYCHIATRIC:  Normal affect   ASSESSMENT:    1. Tachycardia   2. Abnormal EKG   3. Palpitations   4. Hyperlipidemia, unspecified hyperlipidemia type    PLAN:    Palpitations/tachycardia: Wore 30-day monitor 03/2018, had episode of possible atach vs aflutter, lasted 10 minutes.  Suspected likely atrial tachycardia and she was started on nadolol and has had no further palpitations.  Switch to Toprol-XL 25 mg daily 08/2021 -Has had heart rates up to 160s with irregular rhythm flagged by her watch.  Will check Zio patch x 2 weeks -Check echocardiogram to rule out structural heart disease  Hypertension: On Toprol-XL 25 mg daily.  Appears controlled.  Hyperlipidemia: On atorvastatin 20 mg daily.  LDL 89 on 04/15/2021  RTC in 6 months   Medication Adjustments/Labs and Tests Ordered: Current medicines are reviewed at length with the patient today.  Concerns regarding medicines are outlined above.  Orders Placed This Encounter  Procedures   LONG TERM MONITOR (3-14 DAYS)   EKG 12-Lead   ECHOCARDIOGRAM COMPLETE   No orders of the defined types were placed in this encounter.   Patient Instructions  Medication Instructions:  Your physician recommends that you continue on your current medications as directed. Please refer to the  Current Medication list given to you today.  *If you need a refill on your cardiac medications before your next appointment, please call your pharmacy*  Testing/Procedures: Your physician has requested that you have an echocardiogram. Echocardiography is a painless test that uses sound waves to create images of your heart. It provides your doctor with information about the size and shape of your heart and how well your heart's chambers and valves are working. This procedure takes approximately one hour. There are no restrictions for this procedure. Please do NOT wear cologne, perfume, aftershave, or lotions (deodorant is allowed). Please arrive 15 minutes prior to your appointment time.  ZIO XT- Long Term Monitor Instructions   Your physician has requested you wear a ZIO patch monitor for _14__ days.  This is a single patch monitor.   [employer] supplies one patch  monitor per enrollment. Additional stickers are not available. Please do not apply patch if you will be having a Nuclear Stress Test, Echocardiogram, Cardiac CT, MRI, or Chest Xray during the period you would be wearing the monitor. The patch cannot be worn during these tests. You cannot remove and re-apply the ZIO XT patch monitor.  Your ZIO patch monitor will be sent Fed Ex from [employer] directly to your home address. It may take 3-5 days to receive your monitor after you have been enrolled.  Once you have received your monitor, please review the enclosed instructions. Your monitor has already been registered assigning a specific monitor serial # to you.  Billing and Patient Assistance Program Information   We have supplied [employer] with any of your insurance information on file for billing purposes. [employer] offers a sliding scale Patient Assistance Program for patients that do not have insurance, or whose insurance does not completely cover the cost of the ZIO monitor.   You must apply for the Patient Assistance Program to  qualify for this discounted rate.     To apply, please call [employer] at [phone], select option 4, then select option 2, and ask to apply for Patient Assistance Program.  Theodore Demark will ask your household income, and how many people are in your household.  They will quote your out-of-pocket cost based on that information.  [employer] will also be able to set up a 79-month interest-free payment plan if needed.  Applying the monitor   Shave hair from upper left chest.  Hold abrader disc by orange tab. Rub abrader in 40 strokes over the upper left chest as indicated in your monitor instructions.  Clean area with 4 enclosed alcohol pads. Let dry.  Apply patch as indicated in monitor instructions. Patch will be placed under collarbone on left side of chest with arrow pointing upward.  Rub patch adhesive wings for 2 minutes. Remove white label marked "1". Remove the white label marked "2". Rub patch adhesive wings for 2 additional minutes.  While looking in a mirror, press and release button in center of patch. A small green light will flash 3-4 times. This will be your only indicator that the monitor has been turned on. ?  Do not shower for the first 24 hours. You may shower after the first 24 hours.  Press the button if you feel a symptom. You will hear a small click. Record Date, Time and Symptom in the Patient Logbook.  When you are ready to remove the patch, follow instructions on the last 2 pages of the Patient Logbook. Stick patch monitor onto the last page of Patient Logbook.  Place Patient Logbook in the blue and white box.  Use locking tab on box and tape box closed securely.  The blue and white box has prepaid postage on it. Please place it in the mailbox as soon as possible. Your physician should have your test results approximately 7 days after the monitor has [facility]  Call [facility] [phone] you have questions regarding your ZIO XT patch  monitor. Call them immediately if you see an orange light blinking on your monitor.  If your monitor falls off in less than 4 days, contact our Monitor department at [phone] ?If your monitor becomes loose or falls off after 4 days call [employer] at [phone] suggestions on securing your monitor.?  Follow-Up: At [facility] you and your health needs are our priority.  As  part of our continuing mission to provide you with exceptional heart care, we have created designated Provider Care Teams.  These Care Teams include your primary Cardiologist (physician) and Advanced Practice Providers (APPs -  Physician Assistants and Nurse Practitioners) who all work together to provide you with the care you need, when you need it.  We recommend signing up for the patient portal called "MyChart".  Sign up information is provided on this After Visit Summary.  MyChart is used to connect with patients for Virtual Visits (Telemedicine).  Patients are able to view lab/test results, encounter notes, upcoming appointments, etc.  Non-urgent messages can be sent to your provider as well.   To learn more about what you can do with MyChart, go to [email or web address].    Your next appointment:   6 month(s)  The format for your next appointment:   In Person  Provider:   Dr. Gardiner Rhyme         Signed, Donato Heinz, MD  02/17/2022 9:45 AM    [place]

## 2022-02-19 DIAGNOSIS — Z6826 Body mass index (BMI) 26.0-26.9, adult: Secondary | ICD-10-CM | POA: Diagnosis not present

## 2022-02-19 DIAGNOSIS — Z9689 Presence of other specified functional implants: Secondary | ICD-10-CM | POA: Diagnosis not present

## 2022-02-19 DIAGNOSIS — M47816 Spondylosis without myelopathy or radiculopathy, lumbar region: Secondary | ICD-10-CM | POA: Diagnosis not present

## 2022-02-19 DIAGNOSIS — M5416 Radiculopathy, lumbar region: Secondary | ICD-10-CM | POA: Diagnosis not present

## 2022-02-19 DIAGNOSIS — R000 Tachycardia, unspecified: Secondary | ICD-10-CM

## 2022-03-11 ENCOUNTER — Encounter: Payer: Self-pay | Admitting: Cardiology

## 2022-03-12 DIAGNOSIS — E663 Overweight: Secondary | ICD-10-CM | POA: Diagnosis not present

## 2022-03-12 DIAGNOSIS — R000 Tachycardia, unspecified: Secondary | ICD-10-CM | POA: Diagnosis not present

## 2022-03-12 DIAGNOSIS — Z6828 Body mass index (BMI) 28.0-28.9, adult: Secondary | ICD-10-CM | POA: Diagnosis not present

## 2022-03-12 DIAGNOSIS — E782 Mixed hyperlipidemia: Secondary | ICD-10-CM | POA: Diagnosis not present

## 2022-03-12 DIAGNOSIS — G809 Cerebral palsy, unspecified: Secondary | ICD-10-CM | POA: Diagnosis not present

## 2022-03-17 ENCOUNTER — Telehealth: Payer: Self-pay | Admitting: Cardiology

## 2022-03-17 DIAGNOSIS — M25561 Pain in right knee: Secondary | ICD-10-CM | POA: Diagnosis not present

## 2022-03-17 NOTE — Progress Notes (Unsigned)
Cardiology Office Note:    Date:  03/18/2022   ID:  Holly Mcconnell, DOB September 07, 1945, MRN [id number]  PCP:  Cari Caraway, MD  Cardiologist:  Glenetta Hew, MD  Electrophysiologist:  None   Referring MD: Cari Caraway, MD   Chief Complaint  Patient presents with   Atrial Flutter    History of Present Illness:    Holly Mcconnell is a 77 y.o. female with a hx of atrial flutter, hypertension, cerebral palsy who presents for follow-up.  She previously followed with Dr. Ellyn Hack, last seen 01/2021.  Wore 30-day monitor 03/2018, had episode of possible atach vs aflutter, lasted 10 minutes.  Suspecting likely atrial tachycardia and she was started on nadolol and has had no further palpitations.  Switched in 08/2021 to metoprolol.    Zio patch x 14 days on 03/12/2022 showed 9% atrial flutter burden, average rate 134 bpm with longest episode lasting 1 hour with average rate 147 bpm, occasional PVCs (3% of beats).  Since last clinic visit, she reports he has been doing well.  Denies any chest pain, dyspnea, lightheadedness, syncope, lower extremity edema, or palpitations.  Denies any bleeding issues.  Does report has history of falls due to her cerebral palsy, last fall about 6 months ago.  Planning to have spinal stimulator removed.   Past Medical History:  Diagnosis Date   Cerebral palsy (HCC)    Mild symptoms.   Migraine headache     Past Surgical History:  Procedure Laterality Date   CHOLECYSTECTOMY  1989   TEMPOROMANDIBULAR JOINT ARTHROPLASTY     Thyroid nodule resection  2005   TOTAL ABDOMINAL HYSTERECTOMY W/ BILATERAL SALPINGOOPHORECTOMY  1982    Current Medications: Current Meds  Medication Sig   acetaminophen (TYLENOL) 650 MG CR tablet 1 dose as needed   apixaban (ELIQUIS) 5 MG TABS tablet Take 1 tablet (5 mg total) by mouth 2 (two) times daily.   atorvastatin (LIPITOR) 20 MG tablet 1 tablet   calcium-vitamin D (OSCAL WITH D) 500-200 MG-UNIT tablet Take 1  tablet by mouth.   ibandronate (BONIVA) 150 MG tablet 1 tablet   Krill Oil 500 MG CAPS Take by mouth.   levothyroxine (SYNTHROID, LEVOTHROID) 75 MCG tablet TK 1 T PO QD IN THE MORNING OES   MAGNESIUM PO Take by mouth daily.   Multiple Vitamins-Minerals (MULTIVITAMIN ADULT PO) Take by mouth.   SUMAtriptan (IMITREX) 100 MG tablet Take 100 mg by mouth every 2 (two) hours as needed for migraine. May repeat in 2 hours if headache persists or recurs.   topiramate (TOPAMAX) 25 MG tablet Take 25 mg by mouth daily.   [DISCONTINUED] aspirin 81 MG chewable tablet Chew by mouth daily.   [DISCONTINUED] metoprolol succinate (TOPROL-XL) 25 MG 24 hr tablet Take 25 mg by mouth daily.     Allergies:   Amoxicillin, Nabumetone, Other, Sulfa antibiotics, and Codeine   Social History   Socioeconomic History   Marital status: Married    Spouse name: Not on file   Number of children: 1   Years of education: Not on file   Highest education level: Some college, no degree  Occupational History   Not on file  Tobacco Use   Smoking status: Never   Smokeless tobacco: Never  Substance and Sexual Activity   Alcohol use: Never   Drug use: Never   Sexual activity: Not on file  Other Topics Concern   Not on file  Social History Narrative   She is retired.  Formerly  worked in Insurance underwriter.  She is married (husband's name is Abbe Amsterdam -married in January 03, 1967).   They have 1 child and 8 biologic grandkids and one adopted grandkid.  She lives with her husband and is very active.     She does cardio-strength exercises with a trainer roughly 5 days a week for 30+ minutes at a time.      She walks with a walker and has some mild speech deficits and musculoskeletal deficits from history of cerebral palsy syndrome. -- >  Does have a handicap placard      Social Determinants of Radio broadcast assistant Strain: Not on file  Food Insecurity: Not on file  Transportation Needs: Not on file  Physical Activity: Not on  file  Stress: Not on file  Social Connections: Not on file     Family History: The patient's family history includes Atrial fibrillation in her maternal grandfather and maternal uncle; Atrial fibrillation (age of onset: 79) in her mother; Breast cancer in her maternal grandmother; CAD in her mother; COPD in her mother; Colon cancer in her maternal grandmother; Hodgkin's lymphoma in her father; Hypercholesterolemia in her mother; Hyperlipidemia in her sister; Hypertension in her mother and sister; Hypothyroidism in her mother; Lung cancer in her father; Pancreatic cancer in her sister; Stroke in her mother.  ROS:   Please see the history of present illness.     All other systems reviewed and are negative.  EKGs/Labs/Other Studies Reviewed:    The following studies were reviewed today:   EKG:   02/17/2022: Normal sinus rhythm, rate 70, low voltage, no ST abnormalities 03/18/2022: Normal sinus rhythm, rate 75, no ST abnormality  Recent Labs: No results found for requested labs within last 365 days.  Recent Lipid Panel No results found for: "CHOL", "TRIG", "HDL", "CHOLHDL", "VLDL", "LDLCALC", "LDLDIRECT"  Physical Exam:    VS:  BP 120/70   Pulse 69   Ht 4' 11.5" (1.511 m)   Wt 149 lb 12.8 oz (67.9 kg)   SpO2 95%   BMI 29.75 kg/m     Wt Readings from Last 3 Encounters:  03/18/22 149 lb 12.8 oz (67.9 kg)  02/17/22 149 lb 3.2 oz (67.7 kg)  01/28/21 141 lb (64 kg)     GEN:  Well nourished, well developed in no acute distress HEENT: Normal NECK: No JVD; No carotid bruits LYMPHATICS: No lymphadenopathy CARDIAC: RRR, no murmurs, rubs, gallops RESPIRATORY:  Clear to auscultation without rales, wheezing or rhonchi  ABDOMEN: Soft, non-tender, non-distended MUSCULOSKELETAL:  No edema; No deformity  SKIN: Warm and dry NEUROLOGIC:  Alert and oriented x 3 PSYCHIATRIC:  Normal affect   ASSESSMENT:    1. Atrial flutter, unspecified type ([place])   2. Pre-op evaluation   3. Essential  hypertension   4. Hyperlipidemia, unspecified hyperlipidemia type     PLAN:    Atrial flutter: Zio patch x 14 days on 03/12/2022 showed 9% atrial flutter burden, average rate 134 bpm with longest episode lasting 1 hour with average rate 147 bpm, occasional PVCs (3% of beats).  Appears asymptomatic -Echocardiogram scheduled for 1/18 -Increase Toprol-XL to 50 mg daily -CHA2DS2-VASc 4 (age x 2, hypertension, female).  Recommend starting Eliquis 5 mg twice daily.  Discussed risk/benefits of anticoagulation with her and her husband.  She does have a history of falls with her cerebral palsy, though reports none recently and has had 1 fall in last 6 months.  She is agreeable to starting anticoagulation but is interested  in avoiding long-term anticoagulation.  Will refer to Dr. Quentin Ore for evaluation of watchman versus ablation -Check BMET, CBC  Preop evaluation: Planning spinal stimulator removal.  Functional capacity greater than 4 METS, denies any exertional symptoms.  RCRI score 0.  No further cardiac workup recommended prior to procedure.  Can hold Eliquis x 2 days prior to procedure  Hypertension: On Toprol-XL 25 mg daily.  Increase to 50 mg daily as above  Hyperlipidemia: On atorvastatin 20 mg daily.  LDL 89 on 04/15/2021  RTC in 5 months   Medication Adjustments/Labs and Tests Ordered: Current medicines are reviewed at length with the patient today.  Concerns regarding medicines are outlined above.  Orders Placed This Encounter  Procedures   Basic metabolic panel   CBC   Ambulatory referral to Cardiac Electrophysiology   EKG 12-Lead   Meds ordered this encounter  Medications   DISCONTD: metoprolol succinate (TOPROL-XL) 50 MG 24 hr tablet    Sig: Take 1 tablet (50 mg total) by mouth daily.    Dispense:  90 tablet    Refill:  3    Dose increase   apixaban (ELIQUIS) 5 MG TABS tablet    Sig: Take 1 tablet (5 mg total) by mouth 2 (two) times daily.    Dispense:  180 tablet    Refill:   1   metoprolol succinate (TOPROL-XL) 50 MG 24 hr tablet    Sig: Take 1 tablet (50 mg total) by mouth daily.    Dispense:  90 tablet    Refill:  3    Dose increase    Patient Instructions  Medication Instructions:  INCREASE Toprol XL to 50 mg daily STOP aspirin START Eliquis 5 mg two times daily  **Ok to hold Eliquis for 2 days prior to your procedure**  *If you need a refill on your cardiac medications before your next appointment, please call your pharmacy*   Lab Work: BMET, CBC today  If you have labs (blood work) drawn today and your tests are completely normal, you will receive your results only by: MyChart Message (if you have MyChart) OR A paper copy in the mail If you have any lab test that is abnormal or we need to change your treatment, we will call you to review the results.  Follow-Up: At [facility], you and your health needs are our priority.  As part of our continuing mission to provide you with exceptional heart care, we have created designated Provider Care Teams.  These Care Teams include your primary Cardiologist (physician) and Advanced Practice Providers (APPs -  Physician Assistants and Nurse Practitioners) who all work together to provide you with the care you need, when you need it.  We recommend signing up for the patient portal called "MyChart".  Sign up information is provided on this After Visit Summary.  MyChart is used to connect with patients for Virtual Visits (Telemedicine).  Patients are able to view lab/test results, encounter notes, upcoming appointments, etc.  Non-urgent messages can be sent to your provider as well.   To learn more about what you can do with MyChart, go to [email or web address].    Your next appointment:   As scheduled with Dr. Gardiner Rhyme  Other Instructions You have been referred to: Dr.Lambert to discuss watchman and ablation     Signed, Donato Heinz, MD  03/18/2022 12:43 PM    [place]

## 2022-03-17 NOTE — Telephone Encounter (Signed)
After speak to [facility], RN scheduled patient for 10:30 A.M. appt with Dr. Gardiner Rhyme 1/16.

## 2022-03-17 NOTE — Telephone Encounter (Signed)
Pt's husband is calling to schedule appt that was offered via MyChart and to discuss monitor results.

## 2022-03-17 NOTE — Telephone Encounter (Signed)
Called patient husband, they were made appointment for tomorrow 01/16 at 10:30 AM.   No other questions at this time.

## 2022-03-17 NOTE — Telephone Encounter (Signed)
See result note

## 2022-03-18 ENCOUNTER — Encounter: Payer: Self-pay | Admitting: Cardiology

## 2022-03-18 ENCOUNTER — Ambulatory Visit: Payer: PPO | Attending: Cardiology | Admitting: Cardiology

## 2022-03-18 VITALS — BP 120/70 | HR 69 | Ht 59.5 in | Wt 149.8 lb

## 2022-03-18 DIAGNOSIS — I10 Essential (primary) hypertension: Secondary | ICD-10-CM

## 2022-03-18 DIAGNOSIS — Z01818 Encounter for other preprocedural examination: Secondary | ICD-10-CM

## 2022-03-18 DIAGNOSIS — E785 Hyperlipidemia, unspecified: Secondary | ICD-10-CM | POA: Diagnosis not present

## 2022-03-18 DIAGNOSIS — I4892 Unspecified atrial flutter: Secondary | ICD-10-CM | POA: Diagnosis not present

## 2022-03-18 MED ORDER — METOPROLOL SUCCINATE ER 50 MG PO TB24: 50.0000 mg | ORAL_TABLET | Freq: Every day | ORAL | 3 refills | Status: DC

## 2022-03-18 MED ORDER — APIXABAN 5 MG PO TABS: 5.0000 mg | ORAL_TABLET | Freq: Two times a day (BID) | ORAL | 1 refills | Status: DC

## 2022-03-18 NOTE — Patient Instructions (Addendum)
Medication Instructions:  INCREASE Toprol XL to 50 mg daily STOP aspirin START Eliquis 5 mg two times daily  **Ok to hold Eliquis for 2 days prior to your procedure**  *If you need a refill on your cardiac medications before your next appointment, please call your pharmacy*   Lab Work: BMET, CBC today  If you have labs (blood work) drawn today and your tests are completely normal, you will receive your results only by: [place] (if you have MyChart) OR A paper copy in the mail If you have any lab test that is abnormal or we need to change your treatment, we will call you to review the results.  Follow-Up: At [facility], you and your health needs are our priority.  As part of our continuing mission to provide you with exceptional heart care, we have created designated Provider Care Teams.  These Care Teams include your primary Cardiologist (physician) and Advanced Practice Providers (APPs -  Physician Assistants and Nurse Practitioners) who all work together to provide you with the care you need, when you need it.  We recommend signing up for the patient portal called "MyChart".  Sign up information is provided on this After Visit Summary.  MyChart is used to connect with patients for Virtual Visits (Telemedicine).  Patients are able to view lab/test results, encounter notes, upcoming appointments, etc.  Non-urgent messages can be sent to your provider as well.   To learn more about what you can do with MyChart, go to [email or web address].    Your next appointment:   As scheduled with Dr. Gardiner Rhyme  Other Instructions You have been referred to: Dr.Lambert to discuss watchman and ablation

## 2022-03-19 LAB — CBC
Hematocrit: 38.9 % (ref 34.0–46.6)
Hemoglobin: 13 g/dL (ref 11.1–15.9)
MCH: 30.3 pg (ref 26.6–33.0)
MCHC: 33.4 g/dL (ref 31.5–35.7)
MCV: 91 fL (ref 79–97)
Platelets: 299 10*3/uL (ref 150–450)
RBC: 4.29 x10E6/uL (ref 3.77–5.28)
RDW: 12.6 % (ref 11.7–15.4)
WBC: 11.3 10*3/uL — ABNORMAL HIGH (ref 3.4–10.8)

## 2022-03-19 LAB — BASIC METABOLIC PANEL
BUN/Creatinine Ratio: 26 (ref 12–28)
BUN: 20 mg/dL (ref 8–27)
CO2: 19 mmol/L — ABNORMAL LOW (ref 20–29)
Calcium: 10.2 mg/dL (ref 8.7–10.3)
Chloride: 107 mmol/L — ABNORMAL HIGH (ref 96–106)
Creatinine, Ser: 0.76 mg/dL (ref 0.57–1.00)
Glucose: 115 mg/dL — ABNORMAL HIGH (ref 70–99)
Potassium: 4.4 mmol/L (ref 3.5–5.2)
Sodium: 143 mmol/L (ref 134–144)
eGFR: 81 mL/min/{1.73_m2} (ref >59)

## 2022-03-20 ENCOUNTER — Ambulatory Visit (HOSPITAL_COMMUNITY): Payer: PPO | Attending: Cardiology

## 2022-03-20 DIAGNOSIS — R9431 Abnormal electrocardiogram [ECG] [EKG]: Secondary | ICD-10-CM | POA: Insufficient documentation

## 2022-03-20 LAB — ECHOCARDIOGRAM COMPLETE
Area-P 1/2: 4.06 cm2
S' Lateral: 2.6 cm

## 2022-04-14 DIAGNOSIS — G809 Cerebral palsy, unspecified: Secondary | ICD-10-CM | POA: Diagnosis not present

## 2022-04-14 DIAGNOSIS — E782 Mixed hyperlipidemia: Secondary | ICD-10-CM | POA: Diagnosis not present

## 2022-04-14 DIAGNOSIS — E663 Overweight: Secondary | ICD-10-CM | POA: Diagnosis not present

## 2022-04-14 DIAGNOSIS — Z6827 Body mass index (BMI) 27.0-27.9, adult: Secondary | ICD-10-CM | POA: Diagnosis not present

## 2022-04-14 NOTE — Progress Notes (Unsigned)
Electrophysiology Office Note:    Date:  04/14/2022   ID:  Jda Hickenbottom, DOB [birth year], MRN [id number]  PCP:  Cari Caraway, MD  [facility] Cardiologist:  Glenetta Hew, MD  [facility] Electrophysiologist:  Vickie Epley, MD   Referring MD: Cari Caraway, MD   Chief Complaint: Atrial flutter  History of Present Illness:    Holly Mcconnell is a 77 y.o. female who presents for an evaluation of atrial flutter at the request of Dr. Gardiner Rhyme. Their medical history includes hypertension, cerebral palsy, atrial flutter.  The patient last saw Dr. Gardiner Rhyme March 18, 2022.  She had recently worn a ZIO monitor for 14 days which showed a 9% atrial flutter burden with an average rate of 134 bpm. At the last appointment with Dr. Gardiner Rhyme, Eliquis was started.  The patient prefers to avoid long-term exposure to anticoagulation given her history of cerebral palsy and history of falls.    Past Medical History:  Diagnosis Date   Cerebral palsy (HCC)    Mild symptoms.   Migraine headache     Past Surgical History:  Procedure Laterality Date   CHOLECYSTECTOMY  1989   TEMPOROMANDIBULAR JOINT ARTHROPLASTY     Thyroid nodule resection  2005   TOTAL ABDOMINAL HYSTERECTOMY W/ BILATERAL SALPINGOOPHORECTOMY  1982    Current Medications: No outpatient medications have been marked as taking for the 04/15/22 encounter (Office Visit) with Vickie Epley, MD.     Allergies:   Amoxicillin, Nabumetone, Other, Sulfa antibiotics, and Codeine   Social History   Socioeconomic History   Marital status: Married    Spouse name: Not on file   Number of children: 1   Years of education: Not on file   Highest education level: Some college, no degree  Occupational History   Not on file  Tobacco Use   Smoking status: Never   Smokeless tobacco: Never  Substance and Sexual Activity   Alcohol use: Never   Drug use: Never   Sexual activity: Not on file  Other Topics  Concern   Not on file  Social History Narrative   She is retired.  Formerly worked in Insurance underwriter.  She is married (husband's name is Abbe Amsterdam -married in January 03, 1967).   They have 1 child and 8 biologic grandkids and one adopted grandkid.  She lives with her husband and is very active.     She does cardio-strength exercises with a trainer roughly 5 days a week for 30+ minutes at a time.      She walks with a walker and has some mild speech deficits and musculoskeletal deficits from history of cerebral palsy syndrome. -- >  Does have a handicap placard      Social Determinants of Radio broadcast assistant Strain: Not on file  Food Insecurity: Not on file  Transportation Needs: Not on file  Physical Activity: Not on file  Stress: Not on file  Social Connections: Not on file     Family History: The patient's family history includes Atrial fibrillation in her maternal grandfather and maternal uncle; Atrial fibrillation (age of onset: 30) in her mother; Breast cancer in her maternal grandmother; CAD in her mother; COPD in her mother; Colon cancer in her maternal grandmother; Hodgkin's lymphoma in her father; Hypercholesterolemia in her mother; Hyperlipidemia in her sister; Hypertension in her mother and sister; Hypothyroidism in her mother; Lung cancer in her father; Pancreatic cancer in her sister; Stroke in her mother.  ROS:  Please see the history of present illness.    All other systems reviewed and are negative.  EKGs/Labs/Other Studies Reviewed:    The following studies were reviewed today:  March 20, 2022 echo EF 60 RV normal mild MR  March 16, 2022 ZIO monitor 9% burden of atrial flutter/fibrillation with an average rate of 134 bpm Symptom triggered episodes corresponded to atrial flutter/fib events Occasional ventricular ectopy, 3% Automated report only mentions atrial flutter but rhythm strip (see below) shows both atrial fibrillation and flutter.     EKG:  The  ekg ordered today demonstrates ***   Recent Labs: 03/18/2022: BUN 20; Creatinine, Ser 0.76; Hemoglobin 13.0; Platelets 299; Potassium 4.4; Sodium 143  Recent Lipid Panel No results found for: "CHOL", "TRIG", "HDL", "CHOLHDL", "VLDL", "LDLCALC", "LDLDIRECT"  Physical Exam:    VS:  There were no vitals taken for this visit.    Wt Readings from Last 3 Encounters:  03/18/22 149 lb 12.8 oz (67.9 kg)  02/17/22 149 lb 3.2 oz (67.7 kg)  01/28/21 141 lb (64 kg)     GEN: *** Well nourished, well developed in no acute distress CARDIAC: ***RRR, no murmurs, rubs, gallops RESPIRATORY:  Clear to auscultation without rales, wheezing or rhonchi       ASSESSMENT:    No diagnosis found. PLAN:    In order of problems listed above:  #Atrial fibrillation and flutter Symptomatic.  9% burden.  On Eliquis for stroke prophylaxis.  Both atrial fibrillation and flutter present on ZIO monitor.  Discussed treatment options including catheter ablation and antiarrhythmic drug therapy and the patient like to avoid long-term exposure medications.  I think she is an overall acceptable candidate for catheter ablation.  She would like to avoid long-term exposure to anticoagulation given her history of falls which I think is very reasonable.  We discussed left atrial appendage occlusion as a mechanism to mitigate the stroke risk and avoid long-term exposure anticoagulation.  She would like to proceed with evaluation for this procedure.  Will plan to do the Watchman procedure 8 weeks after catheter ablation.  -----------------------------  Discussed treatment options today for their AF including antiarrhythmic drug therapy and ablation. Discussed risks, recovery and likelihood of success. Discussed potential need for repeat ablation procedures and antiarrhythmic drugs after an initial ablation. They wish to proceed with scheduling.  Risk, benefits, and alternatives to EP study and radiofrequency ablation for afib  were also discussed in detail today. These risks include but are not limited to stroke, bleeding, vascular damage, tamponade, perforation, damage to the esophagus, lungs, and other structures, pulmonary vein stenosis, worsening renal function, and death. The patient understands these risk and wishes to proceed.  We will therefore proceed with catheter ablation at the next available time.  Carto, ICE, anesthesia are requested for the procedure.  Will also obtain CT PV protocol prior to the procedure to exclude LAA thrombus and further evaluate atrial anatomy.  ----------------------------  I have seen [place] in the office today who is being considered for a Watchman left atrial appendage closure device. I believe they will benefit from this procedure given their history of atrial fibrillation, CHA2DS2-VASc score of 4 and unadjusted ischemic stroke rate of 4.8% per year. Unfortunately, the patient is not felt to be a long term anticoagulation candidate secondary to history of CP c/b falls. The patient's chart has been reviewed and I feel that they would be a candidate for short term oral anticoagulation after Watchman implant.   It is my  belief that after undergoing a LAA closure procedure, [place] will not need long term anticoagulation which eliminates anticoagulation side effects and major bleeding risk.   Procedural risks for the Watchman implant have been reviewed with the patient including a 0.5% risk of stroke, <1% risk of perforation and <1% risk of device embolization. Other risks include bleeding, vascular damage, tamponade, worsening renal function, and death. The patient understands these risk and wishes to proceed.     The published clinical data on the safety and effectiveness of WATCHMAN include but are not limited to the following: - Holmes DR, Mechele Claude, Sick P et al. for the PROTECT AF Investigators. Percutaneous closure of the left atrial appendage versus  warfarin therapy for prevention of stroke in patients with atrial fibrillation: a randomised non-inferiority trial. Lancet 2009; 374: 534-42. Mechele Claude, Doshi SK, Abelardo Diesel D et al. on behalf of the PROTECT AF Investigators. Percutaneous Left Atrial Appendage Closure for Stroke Prophylaxis in Patients With Atrial Fibrillation 2.3-Year Follow-up of the PROTECT AF (Watchman Left Atrial Appendage System for Embolic Protection in Patients With Atrial Fibrillation) Trial. Circulation 2013; 127:720-729. - Alli O, Doshi S,  Kar S, Reddy VY, Sievert H et al. Quality of Life Assessment in the Randomized PROTECT AF (Percutaneous Closure of the Left Atrial Appendage Versus Warfarin Therapy for Prevention of Stroke in Patients With Atrial Fibrillation) Trial of Patients at Risk for Stroke With Nonvalvular Atrial Fibrillation. J Am Coll Cardiol 2013; P4788364. Vertell Limber DR, Tarri Abernethy, Price M, [place], Sievert H, Doshi S, Huber K, Reddy V. Prospective randomized evaluation of the Watchman left atrial appendage Device in patients with atrial fibrillation versus long-term warfarin therapy; the PREVAIL trial. Journal of the [employer] of Cardiology, Vol. 4, No. 1, 2014, 1-11. - Kar S, Doshi SK, Sadhu A, Horton R, Osorio J et al. Primary outcome evaluation of a next-generation left atrial appendage closure device: results from the PINNACLE FLX trial. Circulation 2021;143(18)1754-1762.    After today's visit with the patient which was dedicated solely for shared decision making visit regarding LAA closure device, the patient decided to proceed with the LAA appendage closure procedure scheduled to be done in the near future at [facility].   HAS-BLED score 2 Hypertension Yes  Abnormal renal and liver function (Dialysis, transplant, Cr >2.26 mg/dL /Cirrhosis or Bilirubin >2x Normal or AST/ALT/AP >3x Normal) No  Stroke No  Bleeding No  Labile INR (Unstable/high INR) No  Elderly (>65) Yes  Drugs or  alcohol (? 8 drinks/week, anti-plt or NSAID) No   CHA2DS2-VASc Score = 4  The patient's score is based upon: CHF History: 0 HTN History: 1 Diabetes History: 0 Stroke History: 0 Vascular Disease History: 0 Age Score: 2 Gender Score: 1         Medication Adjustments/Labs and Tests Ordered: Current medicines are reviewed at length with the patient today.  Concerns regarding medicines are outlined above.  No orders of the defined types were placed in this encounter.  No orders of the defined types were placed in this encounter.    Signed, Hilton Cork. Quentin Ore, MD, [facility], [facility] 04/14/2022 5:09 PM    Electrophysiology [facility]

## 2022-04-15 ENCOUNTER — Telehealth: Payer: Self-pay

## 2022-04-15 ENCOUNTER — Encounter: Payer: Self-pay | Admitting: Cardiology

## 2022-04-15 ENCOUNTER — Ambulatory Visit: Payer: PPO | Attending: Cardiology | Admitting: Cardiology

## 2022-04-15 VITALS — BP 114/68 | HR 59 | Ht 59.5 in | Wt 145.0 lb

## 2022-04-15 DIAGNOSIS — I4892 Unspecified atrial flutter: Secondary | ICD-10-CM | POA: Diagnosis not present

## 2022-04-15 NOTE — Progress Notes (Signed)
Electrophysiology Office Note:    Date:  04/15/2022   ID:  Holly Mcconnell, DOB 03/19/1945, MRN [id number]  PCP:  Cari Caraway, MD  [facility] Cardiologist:  Glenetta Hew, MD  [facility] Electrophysiologist:  Vickie Epley, MD   Referring MD: Cari Caraway, MD   Chief Complaint: Atrial flutter  History of Present Illness:    Holly Mcconnell is a 77 y.o. female who presents for an evaluation of atrial flutter at the request of Dr. Gardiner Rhyme. Their medical history includes hypertension, cerebral palsy, atrial flutter.  The patient last saw Dr. Gardiner Rhyme March 18, 2022.  She had recently worn a ZIO monitor for 14 days which showed a 9% atrial flutter burden with an average rate of 134 bpm. At the last appointment with Dr. Gardiner Rhyme, Eliquis was started.  The patient prefers to avoid long-term exposure to anticoagulation given her history of cerebral palsy and history of falls.  Today, she is accompanied by her husband. She has been following up with Dr. Gardiner Rhyme for atrial fibrillation and flutter.   She has had 2 falls within the last year. When she falls she tends to land on her knees.   She reported that her mother had a pacemaker.  She denies any palpitations, chest pain, shortness of breath, or peripheral edema. No lightheadedness, headaches, syncope, orthopnea, or PND.  Past Medical History:  Diagnosis Date   Cerebral palsy (HCC)    Mild symptoms.   Migraine headache     Past Surgical History:  Procedure Laterality Date   CHOLECYSTECTOMY  1989   TEMPOROMANDIBULAR JOINT ARTHROPLASTY     Thyroid nodule resection  2005   TOTAL ABDOMINAL HYSTERECTOMY W/ BILATERAL SALPINGOOPHORECTOMY  1982    Current Medications: Current Meds  Medication Sig   acetaminophen (TYLENOL) 650 MG CR tablet 1 dose as needed   apixaban (ELIQUIS) 5 MG TABS tablet Take 1 tablet (5 mg total) by mouth 2 (two) times daily.   atorvastatin (LIPITOR) 20 MG tablet 1 tablet    calcium-vitamin D (OSCAL WITH D) 500-200 MG-UNIT tablet Take 1 tablet by mouth.   ibandronate (BONIVA) 150 MG tablet 1 tablet   levothyroxine (SYNTHROID, LEVOTHROID) 75 MCG tablet TK 1 T PO QD IN THE MORNING OES   MAGNESIUM PO Take by mouth daily.   metoprolol succinate (TOPROL-XL) 50 MG 24 hr tablet Take 1 tablet (50 mg total) by mouth daily.   Multiple Vitamins-Minerals (MULTIVITAMIN ADULT PO) Take by mouth.   SUMAtriptan (IMITREX) 100 MG tablet Take 100 mg by mouth every 2 (two) hours as needed for migraine. May repeat in 2 hours if headache persists or recurs.   topiramate (TOPAMAX) 25 MG tablet Take 25 mg by mouth daily.     Allergies:   Clavulanic acid, Nitrofurantoin, Amoxicillin, Nabumetone, Other, Sulfa antibiotics, and Codeine   Social History   Socioeconomic History   Marital status: Married    Spouse name: Not on file   Number of children: 1   Years of education: Not on file   Highest education level: Some college, no degree  Occupational History   Not on file  Tobacco Use   Smoking status: Never   Smokeless tobacco: Never  Substance and Sexual Activity   Alcohol use: Never   Drug use: Never   Sexual activity: Not on file  Other Topics Concern   Not on file  Social History Narrative   She is retired.  Formerly worked in Insurance underwriter.  She is married (husband's name is  Holly Mcconnell -married in January 03, 1967).   They have 1 child and 8 biologic grandkids and one adopted grandkid.  She lives with her husband and is very active.     She does cardio-strength exercises with a trainer roughly 5 days a week for 30+ minutes at a time.      She walks with a walker and has some mild speech deficits and musculoskeletal deficits from history of cerebral palsy syndrome. -- >  Does have a handicap placard      Social Determinants of Radio broadcast assistant Strain: Not on file  Food Insecurity: Not on file  Transportation Needs: Not on file  Physical Activity: Not on file   Stress: Not on file  Social Connections: Not on file     Family History: The patient's family history includes Atrial fibrillation in her maternal grandfather and maternal uncle; Atrial fibrillation (age of onset: 5) in her mother; Breast cancer in her maternal grandmother; CAD in her mother; COPD in her mother; Colon cancer in her maternal grandmother; Hodgkin's lymphoma in her father; Hypercholesterolemia in her mother; Hyperlipidemia in her sister; Hypertension in her mother and sister; Hypothyroidism in her mother; Lung cancer in her father; Pancreatic cancer in her sister; Stroke in her mother.  ROS:   Please see the history of present illness.    (+) Falls (2 within last year) All other systems reviewed and are negative.  EKGs/Labs/Other Studies Reviewed:    The following studies were reviewed today:  March 20, 2022 echo EF 60 RV normal mild MR  March 16, 2022 ZIO monitor 9% burden of atrial flutter/fibrillation with an average rate of 134 bpm Symptom triggered episodes corresponded to atrial flutter/fib events Occasional ventricular ectopy, 3% Automated report only mentions atrial flutter but rhythm strip (see below) shows both atrial fibrillation and flutter.     EKG:  The ekg ordered today demonstrates sinus rhythm.  PVC.  QTc 429 ms.   Recent Labs: 03/18/2022: BUN 20; Creatinine, Ser 0.76; Hemoglobin 13.0; Platelets 299; Potassium 4.4; Sodium 143  Recent Lipid Panel No results found for: "CHOL", "TRIG", "HDL", "CHOLHDL", "VLDL", "LDLCALC", "LDLDIRECT"  Physical Exam:    VS:  BP 114/68   Pulse (!) 59   Ht 4' 11.5" (1.511 m)   Wt 145 lb (65.8 kg)   SpO2 98%   BMI 28.80 kg/m     Wt Readings from Last 3 Encounters:  04/15/22 145 lb (65.8 kg)  03/18/22 149 lb 12.8 oz (67.9 kg)  02/17/22 149 lb 3.2 oz (67.7 kg)     GEN: Well nourished, well developed in no acute distress CARDIAC: RRR, no murmurs, rubs, gallops      ASSESSMENT:    1. Atrial  flutter, unspecified type ([place])    PLAN:    In order of problems listed above:   #Atrial fibrillation and flutter Symptomatic.  9% burden.  On Eliquis for stroke prophylaxis.  Both atrial fibrillation and flutter present on ZIO monitor.  Discussed treatment options including catheter ablation and antiarrhythmic drug therapy and the patient like to avoid long-term exposure medications.  I think she is an overall acceptable candidate for catheter ablation.  She would like to avoid long-term exposure to anticoagulation given her history of falls which I think is very reasonable.  We discussed left atrial appendage occlusion as a mechanism to mitigate the stroke risk and avoid long-term exposure anticoagulation.  She would like to proceed with evaluation for this procedure.  Will plan to  do the Watchman procedure 8 weeks after catheter ablation.  -----------------------------  Discussed treatment options today for their AF including antiarrhythmic drug therapy and ablation. Discussed risks, recovery and likelihood of success. Discussed potential need for repeat ablation procedures and antiarrhythmic drugs after an initial ablation. They wish to proceed with scheduling.  Risk, benefits, and alternatives to EP study and radiofrequency ablation for afib were also discussed in detail today. These risks include but are not limited to stroke, bleeding, vascular damage, tamponade, perforation, damage to the esophagus, lungs, and other structures, pulmonary vein stenosis, worsening renal function, and death. The patient understands these risk and wishes to proceed.  We will therefore proceed with catheter ablation at the next available time.  Carto, ICE, anesthesia are requested for the procedure.  Will also obtain CT PV protocol prior to the procedure to exclude LAA thrombus and further evaluate atrial anatomy.  ----------------------------  I have seen [place] in the office today who is being  considered for a Watchman left atrial appendage closure device. I believe they will benefit from this procedure given their history of atrial fibrillation, CHA2DS2-VASc score of 4 and unadjusted ischemic stroke rate of 4.8% per year. Unfortunately, the patient is not felt to be a long term anticoagulation candidate secondary to history of CP c/b falls. The patient's chart has been reviewed and I feel that they would be a candidate for short term oral anticoagulation after Watchman implant.   It is my belief that after undergoing a LAA closure procedure, [place] will not need long term anticoagulation which eliminates anticoagulation side effects and major bleeding risk.   Procedural risks for the Watchman implant have been reviewed with the patient including a 0.5% risk of stroke, <1% risk of perforation and <1% risk of device embolization. Other risks include bleeding, vascular damage, tamponade, worsening renal function, and death. The patient understands these risk and wishes to proceed.     The published clinical data on the safety and effectiveness of WATCHMAN include but are not limited to the following: - Holmes DR, Mechele Claude, Sick P et al. for the PROTECT AF Investigators. Percutaneous closure of the left atrial appendage versus warfarin therapy for prevention of stroke in patients with atrial fibrillation: a randomised non-inferiority trial. Lancet 2009; 374: 534-42. Mechele Claude, Doshi SK, Abelardo Diesel D et al. on behalf of the PROTECT AF Investigators. Percutaneous Left Atrial Appendage Closure for Stroke Prophylaxis in Patients With Atrial Fibrillation 2.3-Year Follow-up of the PROTECT AF (Watchman Left Atrial Appendage System for Embolic Protection in Patients With Atrial Fibrillation) Trial. Circulation 2013; 127:720-729. - Alli O, Doshi S,  Kar S, Reddy VY, Sievert H et al. Quality of Life Assessment in the Randomized PROTECT AF (Percutaneous Closure of the Left Atrial  Appendage Versus Warfarin Therapy for Prevention of Stroke in Patients With Atrial Fibrillation) Trial of Patients at Risk for Stroke With Nonvalvular Atrial Fibrillation. J Am Coll Cardiol 2013; P4788364. Vertell Limber DR, Tarri Abernethy, Price M, [place], Sievert H, Doshi S, Huber K, Reddy V. Prospective randomized evaluation of the Watchman left atrial appendage Device in patients with atrial fibrillation versus long-term warfarin therapy; the PREVAIL trial. Journal of the [employer] of Cardiology, Vol. 4, No. 1, 2014, 1-11. - Kar S, Doshi SK, Sadhu A, Horton R, Osorio J et al. Primary outcome evaluation of a next-generation left atrial appendage closure device: results from the PINNACLE FLX trial. Circulation 2021;143(18)1754-1762.    After today's visit with the  patient which was dedicated solely for shared decision making visit regarding LAA closure device, the patient decided to proceed with the LAA appendage closure procedure scheduled to be done in the near future at [facility].   HAS-BLED score 2 Hypertension Yes  Abnormal renal and liver function (Dialysis, transplant, Cr >2.26 mg/dL /Cirrhosis or Bilirubin >2x Normal or AST/ALT/AP >3x Normal) No  Stroke No  Bleeding No  Labile INR (Unstable/high INR) No  Elderly (>65) Yes  Drugs or alcohol (? 8 drinks/week, anti-plt or NSAID) No   CHA2DS2-VASc Score = 4  The patient's score is based upon: CHF History: 0 HTN History: 1 Diabetes History: 0 Stroke History: 0 Vascular Disease History: 0 Age Score: 2 Gender Score: 1      Total time of encounter: 60 minutes total time of encounter, including face-to-face patient care, coordination of care and counseling regarding high complexity medical decision making re: AF/AFL.      Medication Adjustments/Labs and Tests Ordered: Current medicines are reviewed at length with the patient today.  Concerns regarding medicines are outlined above.  Orders Placed This Encounter  Procedures    CT CARDIAC MORPH/PULM VEIN W/CM&W/O CA SCORE   Basic metabolic panel   CBC with Differential/Platelet   EKG 12-Lead   No orders of the defined types were placed in this encounter.    I,Rachel Rivera,acting as a scribe for Vickie Epley, MD.,have documented all relevant documentation on the behalf of Vickie Epley, MD,as directed by  Vickie Epley, MD while in the presence of Vickie Epley, MD.  I, Vickie Epley, MD, have reviewed all documentation for this visit. The documentation on 04/15/22 for the exam, diagnosis, procedures, and orders are all accurate and complete.   Signed, Hilton Cork. Quentin Ore, MD, [facility], [facility] 04/15/2022 8:02 PM    Electrophysiology [facility]

## 2022-04-15 NOTE — Patient Instructions (Addendum)
Medication Instructions:  Your physician recommends that you continue on your current medications as directed. Please refer to the Current Medication list given to you today.  *If you need a refill on your cardiac medications before your next appointment, please call your pharmacy*  Lab Work: BMET and CBC: [place]. [address], [place], [place] on May 28th anytime between 7:15am and 5:00pm  Testing/Procedures: Your physician has requested that you have cardiac CT. Cardiac computed tomography (CT) is a painless test that uses an x-ray machine to take clear, detailed pictures of your heart. For further information please visit [facility]. Please follow instruction sheet as given.  Your physician has recommended that you have an ablation. Catheter ablation is a medical procedure used to treat some cardiac arrhythmias (irregular heartbeats). During catheter ablation, a long, thin, flexible tube is put into a blood vessel in your groin (upper thigh), or neck. This tube is called an ablation catheter. It is then guided to your heart through the blood vessel. Radio frequency waves destroy small areas of heart tissue where abnormal heartbeats may cause an arrhythmia to start. Please see the instruction sheet given to you today. You are scheduled for June 11th at 10:30am. Please arrive at [facility], Main Entrance A at 8:30am.   Follow-Up: At [facility], you and your health needs are our priority.  As part of our continuing mission to provide you with exceptional heart care, we have created designated Provider Care Teams.  These Care Teams include your primary Cardiologist (physician) and Advanced Practice Providers (APPs -  Physician Assistants and Nurse Practitioners) who all work together to provide you with the care you need, when you need it.  Your next appointment:   We will call you to arrange your follow up appointments  Other Instructions Nurse Navigator,  Lenice Llamas will be in contact with you in regards to the Watchman procedure. If you have any questions she can be reached at [phone].

## 2022-04-21 DIAGNOSIS — E782 Mixed hyperlipidemia: Secondary | ICD-10-CM | POA: Diagnosis not present

## 2022-04-21 DIAGNOSIS — E039 Hypothyroidism, unspecified: Secondary | ICD-10-CM | POA: Diagnosis not present

## 2022-04-21 DIAGNOSIS — M85852 Other specified disorders of bone density and structure, left thigh: Secondary | ICD-10-CM | POA: Diagnosis not present

## 2022-04-24 DIAGNOSIS — G809 Cerebral palsy, unspecified: Secondary | ICD-10-CM | POA: Diagnosis not present

## 2022-04-24 DIAGNOSIS — B372 Candidiasis of skin and nail: Secondary | ICD-10-CM | POA: Diagnosis not present

## 2022-04-24 DIAGNOSIS — M47897 Other spondylosis, lumbosacral region: Secondary | ICD-10-CM | POA: Diagnosis not present

## 2022-04-24 DIAGNOSIS — R2689 Other abnormalities of gait and mobility: Secondary | ICD-10-CM | POA: Diagnosis not present

## 2022-04-24 DIAGNOSIS — Z0000 Encounter for general adult medical examination without abnormal findings: Secondary | ICD-10-CM | POA: Diagnosis not present

## 2022-04-24 DIAGNOSIS — I4892 Unspecified atrial flutter: Secondary | ICD-10-CM | POA: Diagnosis not present

## 2022-04-24 DIAGNOSIS — Z1331 Encounter for screening for depression: Secondary | ICD-10-CM | POA: Diagnosis not present

## 2022-04-24 DIAGNOSIS — E039 Hypothyroidism, unspecified: Secondary | ICD-10-CM | POA: Diagnosis not present

## 2022-04-24 DIAGNOSIS — G43909 Migraine, unspecified, not intractable, without status migrainosus: Secondary | ICD-10-CM | POA: Diagnosis not present

## 2022-04-24 DIAGNOSIS — E782 Mixed hyperlipidemia: Secondary | ICD-10-CM | POA: Diagnosis not present

## 2022-04-24 DIAGNOSIS — M85852 Other specified disorders of bone density and structure, left thigh: Secondary | ICD-10-CM | POA: Diagnosis not present

## 2022-04-24 DIAGNOSIS — M47816 Spondylosis without myelopathy or radiculopathy, lumbar region: Secondary | ICD-10-CM | POA: Diagnosis not present

## 2022-04-25 DIAGNOSIS — T85193A Other mechanical complication of implanted electronic neurostimulator, generator, initial encounter: Secondary | ICD-10-CM | POA: Diagnosis not present

## 2022-04-25 DIAGNOSIS — Z4542 Encounter for adjustment and management of neuropacemaker (brain) (peripheral nerve) (spinal cord): Secondary | ICD-10-CM | POA: Diagnosis not present

## 2022-04-25 DIAGNOSIS — T85192A Other mechanical complication of implanted electronic neurostimulator (electrode) of spinal cord, initial encounter: Secondary | ICD-10-CM | POA: Diagnosis not present

## 2022-04-25 DIAGNOSIS — G894 Chronic pain syndrome: Secondary | ICD-10-CM | POA: Diagnosis not present

## 2022-04-28 ENCOUNTER — Other Ambulatory Visit: Payer: Self-pay | Admitting: Physical Medicine and Rehabilitation

## 2022-04-28 ENCOUNTER — Encounter: Payer: Self-pay | Admitting: Cardiology

## 2022-04-28 DIAGNOSIS — M47816 Spondylosis without myelopathy or radiculopathy, lumbar region: Secondary | ICD-10-CM

## 2022-05-15 ENCOUNTER — Ambulatory Visit
Admission: RE | Admit: 2022-05-15 | Discharge: 2022-05-15 | Disposition: A | Discharge status: Home / Self Care | Payer: PPO | Source: Ambulatory Visit | Attending: Physical Medicine and Rehabilitation | Admitting: Physical Medicine and Rehabilitation

## 2022-05-15 DIAGNOSIS — M48061 Spinal stenosis, lumbar region without neurogenic claudication: Secondary | ICD-10-CM | POA: Diagnosis not present

## 2022-05-15 DIAGNOSIS — M545 Low back pain, unspecified: Secondary | ICD-10-CM | POA: Diagnosis not present

## 2022-05-15 DIAGNOSIS — M47816 Spondylosis without myelopathy or radiculopathy, lumbar region: Secondary | ICD-10-CM

## 2022-05-20 DIAGNOSIS — M47816 Spondylosis without myelopathy or radiculopathy, lumbar region: Secondary | ICD-10-CM | POA: Diagnosis not present

## 2022-05-26 ENCOUNTER — Telehealth: Payer: Self-pay | Admitting: Cardiology

## 2022-05-26 NOTE — Telephone Encounter (Signed)
Patient states she will be having a back nerve block on Wed. 3/27.  Patient wants to know if she can stop her Eliquis.

## 2022-05-26 NOTE — Telephone Encounter (Signed)
Patient is having a nerve block to lower spine at [facility] and sports medicine, on the 27th.  She ask if she can stop her Eliquis for this procedure

## 2022-05-27 NOTE — Telephone Encounter (Signed)
Spoke to patient, aware of recommendations and verbalized understanding.  Patient states she did not take last night or this AM.  She is aware to restart when ok per surgeon.

## 2022-05-27 NOTE — Telephone Encounter (Signed)
Yes she is fine to hold her Eliquis today and tomorrow for the procedure

## 2022-05-28 DIAGNOSIS — M47816 Spondylosis without myelopathy or radiculopathy, lumbar region: Secondary | ICD-10-CM | POA: Diagnosis not present

## 2022-06-11 DIAGNOSIS — M5416 Radiculopathy, lumbar region: Secondary | ICD-10-CM | POA: Diagnosis not present

## 2022-06-11 DIAGNOSIS — T85192D Other mechanical complication of implanted electronic neurostimulator (electrode) of spinal cord, subsequent encounter: Secondary | ICD-10-CM | POA: Diagnosis not present

## 2022-06-11 DIAGNOSIS — M47816 Spondylosis without myelopathy or radiculopathy, lumbar region: Secondary | ICD-10-CM | POA: Diagnosis not present

## 2022-06-11 DIAGNOSIS — G894 Chronic pain syndrome: Secondary | ICD-10-CM | POA: Diagnosis not present

## 2022-06-23 DIAGNOSIS — E782 Mixed hyperlipidemia: Secondary | ICD-10-CM | POA: Diagnosis not present

## 2022-06-23 DIAGNOSIS — G809 Cerebral palsy, unspecified: Secondary | ICD-10-CM | POA: Diagnosis not present

## 2022-06-23 DIAGNOSIS — E663 Overweight: Secondary | ICD-10-CM | POA: Diagnosis not present

## 2022-06-23 DIAGNOSIS — M1711 Unilateral primary osteoarthritis, right knee: Secondary | ICD-10-CM | POA: Diagnosis not present

## 2022-06-23 DIAGNOSIS — M47816 Spondylosis without myelopathy or radiculopathy, lumbar region: Secondary | ICD-10-CM | POA: Diagnosis not present

## 2022-06-23 DIAGNOSIS — Z6827 Body mass index (BMI) 27.0-27.9, adult: Secondary | ICD-10-CM | POA: Diagnosis not present

## 2022-07-09 DIAGNOSIS — M47816 Spondylosis without myelopathy or radiculopathy, lumbar region: Secondary | ICD-10-CM | POA: Diagnosis not present

## 2022-07-22 DIAGNOSIS — E663 Overweight: Secondary | ICD-10-CM | POA: Diagnosis not present

## 2022-07-22 DIAGNOSIS — Z6827 Body mass index (BMI) 27.0-27.9, adult: Secondary | ICD-10-CM | POA: Diagnosis not present

## 2022-07-22 DIAGNOSIS — E782 Mixed hyperlipidemia: Secondary | ICD-10-CM | POA: Diagnosis not present

## 2022-07-22 DIAGNOSIS — G809 Cerebral palsy, unspecified: Secondary | ICD-10-CM | POA: Diagnosis not present

## 2022-07-29 ENCOUNTER — Ambulatory Visit: Payer: PPO | Attending: Cardiology

## 2022-07-29 ENCOUNTER — Encounter: Payer: Self-pay | Admitting: Cardiology

## 2022-07-29 DIAGNOSIS — I4892 Unspecified atrial flutter: Secondary | ICD-10-CM | POA: Diagnosis not present

## 2022-07-30 LAB — CBC WITH DIFFERENTIAL/PLATELET
Basophils Absolute: 0.1 10*3/uL (ref 0.0–0.2)
Basos: 1 %
EOS (ABSOLUTE): 0.1 10*3/uL (ref 0.0–0.4)
Eos: 1 %
Hematocrit: 38.8 % (ref 34.0–46.6)
Hemoglobin: 12.7 g/dL (ref 11.1–15.9)
Immature Grans (Abs): 0 10*3/uL (ref 0.0–0.1)
Immature Granulocytes: 0 %
Lymphocytes Absolute: 1.8 10*3/uL (ref 0.7–3.1)
Lymphs: 27 %
MCH: 30.9 pg (ref 26.6–33.0)
MCHC: 32.7 g/dL (ref 31.5–35.7)
MCV: 94 fL (ref 79–97)
Monocytes Absolute: 0.7 10*3/uL (ref 0.1–0.9)
Monocytes: 11 %
Neutrophils Absolute: 4 10*3/uL (ref 1.4–7.0)
Neutrophils: 60 %
Platelets: 237 10*3/uL (ref 150–450)
RBC: 4.11 x10E6/uL (ref 3.77–5.28)
RDW: 12.5 % (ref 11.7–15.4)
WBC: 6.7 10*3/uL (ref 3.4–10.8)

## 2022-07-30 LAB — BASIC METABOLIC PANEL
BUN/Creatinine Ratio: 25 (ref 12–28)
BUN: 19 mg/dL (ref 8–27)
CO2: 21 mmol/L (ref 20–29)
Calcium: 9.6 mg/dL (ref 8.7–10.3)
Chloride: 106 mmol/L (ref 96–106)
Creatinine, Ser: 0.77 mg/dL (ref 0.57–1.00)
Glucose: 104 mg/dL — ABNORMAL HIGH (ref 70–99)
Potassium: 4.2 mmol/L (ref 3.5–5.2)
Sodium: 140 mmol/L (ref 134–144)
eGFR: 80 mL/min/{1.73_m2} (ref >59)

## 2022-08-04 ENCOUNTER — Telehealth (HOSPITAL_COMMUNITY): Payer: Self-pay | Admitting: *Deleted

## 2022-08-04 NOTE — Telephone Encounter (Signed)
Reaching out to patient to offer assistance regarding upcoming cardiac imaging study; pt verbalizes understanding of appt date/time, parking situation and where to check in, pre-test NPO status, and verified current allergies; name and call back number provided for further questions should they arise  Larey Brick RN Navigator Cardiac Imaging [facility] [phone] office [phone] cell  Patient to take daily medications medications.  She is aware to arrive at 10:30am.

## 2022-08-05 ENCOUNTER — Ambulatory Visit (HOSPITAL_COMMUNITY)
Admission: RE | Admit: 2022-08-05 | Discharge: 2022-08-05 | Disposition: A | Discharge status: Home / Self Care | Payer: PPO | Source: Ambulatory Visit | Attending: Cardiology | Admitting: Cardiology

## 2022-08-05 DIAGNOSIS — I4892 Unspecified atrial flutter: Secondary | ICD-10-CM | POA: Insufficient documentation

## 2022-08-05 MED ORDER — IOHEXOL 350 MG/ML SOLN
95.0000 mL | Freq: Once | INTRAVENOUS | Status: AC | PRN
  Administered 2022-08-05: 95 mL via INTRAVENOUS

## 2022-08-08 ENCOUNTER — Encounter: Payer: Self-pay | Admitting: Cardiology

## 2022-08-11 DIAGNOSIS — M47816 Spondylosis without myelopathy or radiculopathy, lumbar region: Secondary | ICD-10-CM | POA: Diagnosis not present

## 2022-08-11 NOTE — Pre-Procedure Instructions (Signed)
Instructed patient on the following items: Arrival time 8:30 Nothing to eat or drink after midnight No meds AM of procedure Responsible person to drive you home and stay with you for 24 hrs  Have you missed any doses of anti-coagulant Eliquis- takes twice a day, hasn't missed any doses.  Don't take dose in the morning.

## 2022-08-12 ENCOUNTER — Other Ambulatory Visit (HOSPITAL_COMMUNITY): Payer: Self-pay

## 2022-08-12 ENCOUNTER — Ambulatory Visit (HOSPITAL_COMMUNITY)
Admission: RE | Admit: 2022-08-12 | Discharge: 2022-08-12 | Disposition: A | Discharge status: Home / Self Care | Payer: PPO | Attending: Cardiology | Admitting: Cardiology

## 2022-08-12 ENCOUNTER — Ambulatory Visit (HOSPITAL_COMMUNITY): Payer: PPO

## 2022-08-12 ENCOUNTER — Ambulatory Visit (HOSPITAL_COMMUNITY)
Admission: RE | Disposition: A | Discharge status: Home / Self Care | Payer: Self-pay | Source: Home / Self Care | Attending: Cardiology

## 2022-08-12 ENCOUNTER — Ambulatory Visit (HOSPITAL_BASED_OUTPATIENT_CLINIC_OR_DEPARTMENT_OTHER): Payer: PPO

## 2022-08-12 DIAGNOSIS — I4891 Unspecified atrial fibrillation: Secondary | ICD-10-CM

## 2022-08-12 DIAGNOSIS — I483 Typical atrial flutter: Secondary | ICD-10-CM | POA: Insufficient documentation

## 2022-08-12 DIAGNOSIS — Z8249 Family history of ischemic heart disease and other diseases of the circulatory system: Secondary | ICD-10-CM | POA: Diagnosis not present

## 2022-08-12 DIAGNOSIS — Z7901 Long term (current) use of anticoagulants: Secondary | ICD-10-CM | POA: Insufficient documentation

## 2022-08-12 DIAGNOSIS — M1990 Unspecified osteoarthritis, unspecified site: Secondary | ICD-10-CM | POA: Diagnosis not present

## 2022-08-12 DIAGNOSIS — I4819 Other persistent atrial fibrillation: Secondary | ICD-10-CM | POA: Diagnosis not present

## 2022-08-12 DIAGNOSIS — I700 Atherosclerosis of aorta: Secondary | ICD-10-CM | POA: Insufficient documentation

## 2022-08-12 DIAGNOSIS — E039 Hypothyroidism, unspecified: Secondary | ICD-10-CM | POA: Diagnosis not present

## 2022-08-12 HISTORY — PX: TEE WITHOUT CARDIOVERSION: SHX5443

## 2022-08-12 HISTORY — PX: ATRIAL FIBRILLATION ABLATION: EP1191

## 2022-08-12 LAB — POCT ACTIVATED CLOTTING TIME
Activated Clotting Time: 275 seconds
Activated Clotting Time: 293 seconds
Activated Clotting Time: 366 seconds

## 2022-08-12 SURGERY — ATRIAL FIBRILLATION ABLATION
Anesthesia: General

## 2022-08-12 MED ORDER — HEPARIN SODIUM (PORCINE) 1000 UNIT/ML IJ SOLN
INTRAMUSCULAR | Status: DC | PRN
  Administered 2022-08-12: 1000 [IU] via INTRAVENOUS

## 2022-08-12 MED ORDER — PANTOPRAZOLE SODIUM 40 MG PO TBEC
40.0000 mg | DELAYED_RELEASE_TABLET | Freq: Every day | ORAL | 0 refills | Status: DC
  Filled 2022-08-12: qty 45, 45d supply, fill #0

## 2022-08-12 MED ORDER — ONDANSETRON HCL 4 MG/2ML IJ SOLN: 4.0000 mg | Freq: Four times a day (QID) | INTRAMUSCULAR | Status: DC | PRN

## 2022-08-12 MED ORDER — PROPOFOL 10 MG/ML IV BOLUS
INTRAVENOUS | Status: DC | PRN
  Administered 2022-08-12: 20 mg via INTRAVENOUS
  Administered 2022-08-12: 120 mg via INTRAVENOUS

## 2022-08-12 MED ORDER — LIDOCAINE 2% (20 MG/ML) 5 ML SYRINGE
INTRAMUSCULAR | Status: DC | PRN
  Administered 2022-08-12: 60 mg via INTRAVENOUS

## 2022-08-12 MED ORDER — SODIUM CHLORIDE 0.9 % IV SOLN: INTRAVENOUS | Status: DC

## 2022-08-12 MED ORDER — PROTAMINE SULFATE 10 MG/ML IV SOLN
INTRAVENOUS | Status: DC | PRN
  Administered 2022-08-12: 30 mg via INTRAVENOUS

## 2022-08-12 MED ORDER — COLCHICINE 0.6 MG PO TABS
0.6000 mg | ORAL_TABLET | Freq: Two times a day (BID) | ORAL | 0 refills | Status: DC
  Filled 2022-08-12: qty 10, 5d supply, fill #0

## 2022-08-12 MED ORDER — APIXABAN 5 MG PO TABS
5.0000 mg | ORAL_TABLET | Freq: Two times a day (BID) | ORAL | Status: DC
  Administered 2022-08-12: 5 mg via ORAL
  Filled 2022-08-12: qty 1

## 2022-08-12 MED ORDER — PANTOPRAZOLE SODIUM 40 MG PO TBEC
40.0000 mg | DELAYED_RELEASE_TABLET | Freq: Every day | ORAL | Status: DC
  Administered 2022-08-12: 40 mg via ORAL
  Filled 2022-08-12: qty 1

## 2022-08-12 MED ORDER — HEPARIN SODIUM (PORCINE) 1000 UNIT/ML IJ SOLN
INTRAMUSCULAR | Status: AC
  Filled 2022-08-12: qty 10

## 2022-08-12 MED ORDER — ACETAMINOPHEN 325 MG PO TABS
650.0000 mg | ORAL_TABLET | ORAL | Status: DC | PRN
  Administered 2022-08-12: 650 mg via ORAL
  Filled 2022-08-12: qty 2

## 2022-08-12 MED ORDER — FENTANYL CITRATE (PF) 250 MCG/5ML IJ SOLN
INTRAMUSCULAR | Status: DC | PRN
  Administered 2022-08-12: 100 ug via INTRAVENOUS

## 2022-08-12 MED ORDER — COLCHICINE 0.6 MG PO TABS
0.6000 mg | ORAL_TABLET | Freq: Two times a day (BID) | ORAL | Status: DC
  Administered 2022-08-12: 0.6 mg via ORAL
  Filled 2022-08-12: qty 1

## 2022-08-12 MED ORDER — SUGAMMADEX SODIUM 200 MG/2ML IV SOLN
INTRAVENOUS | Status: DC | PRN
  Administered 2022-08-12: 200 mg via INTRAVENOUS

## 2022-08-12 MED ORDER — DEXAMETHASONE SODIUM PHOSPHATE 10 MG/ML IJ SOLN
INTRAMUSCULAR | Status: DC | PRN
  Administered 2022-08-12: 5 mg via INTRAVENOUS

## 2022-08-12 MED ORDER — PHENYLEPHRINE HCL-NACL 20-0.9 MG/250ML-% IV SOLN
INTRAVENOUS | Status: DC | PRN
  Administered 2022-08-12: 15 ug/min via INTRAVENOUS

## 2022-08-12 MED ORDER — PHENYLEPHRINE HCL (PRESSORS) 10 MG/ML IV SOLN
INTRAVENOUS | Status: DC | PRN
  Administered 2022-08-12: 40 ug via INTRAVENOUS
  Administered 2022-08-12: 80 ug via INTRAVENOUS

## 2022-08-12 MED ORDER — FENTANYL CITRATE (PF) 100 MCG/2ML IJ SOLN
INTRAMUSCULAR | Status: AC
  Filled 2022-08-12: qty 2

## 2022-08-12 MED ORDER — ONDANSETRON HCL 4 MG/2ML IJ SOLN
INTRAMUSCULAR | Status: DC | PRN
  Administered 2022-08-12: 4 mg via INTRAVENOUS

## 2022-08-12 MED ORDER — HEPARIN (PORCINE) IN NACL 1000-0.9 UT/500ML-% IV SOLN
INTRAVENOUS | Status: DC | PRN
  Administered 2022-08-12 (×3): 500 mL

## 2022-08-12 MED ORDER — HEPARIN SODIUM (PORCINE) 1000 UNIT/ML IJ SOLN
INTRAMUSCULAR | Status: DC | PRN
  Administered 2022-08-12: 10000 [IU] via INTRAVENOUS
  Administered 2022-08-12 (×2): 4000 [IU] via INTRAVENOUS

## 2022-08-12 MED ORDER — ROCURONIUM BROMIDE 10 MG/ML (PF) SYRINGE
PREFILLED_SYRINGE | INTRAVENOUS | Status: DC | PRN
  Administered 2022-08-12: 60 mg via INTRAVENOUS

## 2022-08-12 SURGICAL SUPPLY — 18 items
CATH 8FR REPROCESSED SOUNDSTAR (CATHETERS) ×1 IMPLANT
CATH 8FR SOUNDSTAR REPROCESSED (CATHETERS) IMPLANT
CATH ABLAT QDOT MICRO BI TC DF (CATHETERS) IMPLANT
CATH OCTARAY 2.0 F 3-3-3-3-3 (CATHETERS) IMPLANT
CATH S-M CIRCA TEMP PROBE (CATHETERS) IMPLANT
CATH WEB BI DIR CSDF CRV REPRO (CATHETERS) IMPLANT
CLOSURE PERCLOSE PROSTYLE (VASCULAR PRODUCTS) IMPLANT
COVER SWIFTLINK CONNECTOR (BAG) ×1 IMPLANT
PACK EP LATEX FREE (CUSTOM PROCEDURE TRAY) ×1
PACK EP LF (CUSTOM PROCEDURE TRAY) ×1 IMPLANT
PAD DEFIB RADIO PHYSIO CONN (PAD) ×1 IMPLANT
PATCH CARTO3 (PAD) IMPLANT
SHEATH BAYLIS TRANSSEPTAL 98CM (NEEDLE) IMPLANT
SHEATH CARTO VIZIGO SM CVD (SHEATH) IMPLANT
SHEATH PINNACLE 8F 10CM (SHEATH) IMPLANT
SHEATH PINNACLE 9F 10CM (SHEATH) IMPLANT
SHEATH PROBE COVER 6X72 (BAG) IMPLANT
TUBING SMART ABLATE COOLFLOW (TUBING) IMPLANT

## 2022-08-12 NOTE — Transfer of Care (Signed)
Immediate Anesthesia Transfer of Care Note  Patient: Holly Mcconnell  Procedure(s) Performed: ATRIAL FIBRILLATION ABLATION TRANSESOPHAGEAL ECHOCARDIOGRAM  Patient Location: Cath Lab  Anesthesia Type:General  Level of Consciousness: awake, patient cooperative, and responds to stimulation  Airway & Oxygen Therapy: Patient Spontanous Breathing and Patient connected to nasal cannula oxygen  Post-op Assessment: Report given to RN and Post -op Vital signs reviewed and stable  Post vital signs: Reviewed and stable  Last Vitals:  Vitals Value Taken Time  BP 125/63 08/12/22 1335  Temp    Pulse 81 08/12/22 1333  Resp    SpO2 92 % 08/12/22 1333  Vitals shown include unvalidated device data.  Last Pain:  Vitals:   08/12/22 0849  TempSrc: Temporal  PainSc: 0-No pain         Complications: There were no known notable events for this encounter.

## 2022-08-12 NOTE — Anesthesia Procedure Notes (Signed)
Procedure Name: Intubation Date/Time: 08/12/2022 10:42 AM  Performed by: Margarita Rana, CRNAPre-anesthesia Checklist: Patient identified, Emergency Drugs available, Suction available and Patient being monitored Patient Re-evaluated:Patient Re-evaluated prior to induction Oxygen Delivery Method: Circle System Utilized Preoxygenation: Pre-oxygenation with 100% oxygen Induction Type: IV induction Ventilation: Mask ventilation without difficulty Laryngoscope Size: Mac and 3 Grade View: Grade I Tube type: Oral Tube size: 7.0 mm Number of attempts: 1 Airway Equipment and Method: Stylet Placement Confirmation: ETT inserted through vocal cords under direct vision, positive ETCO2 and breath sounds checked- equal and bilateral Secured at: 21 cm Tube secured with: Tape Dental Injury: Teeth and Oropharynx as per pre-operative assessment  Comments: Preformed by Charlann Noss, SRNA - CRNA and MDA present for procedure

## 2022-08-12 NOTE — Progress Notes (Signed)
  Echocardiogram Echocardiogram Transesophageal has been performed.  Holly Mcconnell 08/12/2022, 11:30 AM

## 2022-08-12 NOTE — Anesthesia Preprocedure Evaluation (Signed)
Anesthesia Evaluation  Patient identified by MRN, date of birth, ID band Patient awake    Reviewed: Allergy & Precautions, H&P , NPO status , Patient's Chart, lab work & pertinent test results  Airway Mallampati: II  TM Distance: >3 FB Neck ROM: Full    Dental no notable dental hx.    Pulmonary neg pulmonary ROS   Pulmonary exam normal breath sounds clear to auscultation       Cardiovascular Normal cardiovascular exam+ dysrhythmias Atrial Fibrillation  Rhythm:Regular Rate:Normal     Neuro/Psych  Headaches  negative psych ROS   GI/Hepatic negative GI ROS, Neg liver ROS,,,  Endo/Other  Hypothyroidism    Renal/GU negative Renal ROS  negative genitourinary   Musculoskeletal  (+) Arthritis , Osteoarthritis,    Abdominal   Peds negative pediatric ROS (+)  Hematology negative hematology ROS (+)   Anesthesia Other Findings   Reproductive/Obstetrics negative OB ROS                             Anesthesia Physical Anesthesia Plan  ASA: 3  Anesthesia Plan: General   Post-op Pain Management: Minimal or no pain anticipated   Induction: Intravenous  PONV Risk Score and Plan: 3 and Ondansetron, Dexamethasone, Midazolam and Treatment may vary due to age or medical condition  Airway Management Planned: Oral ETT  Additional Equipment:   Intra-op Plan:   Post-operative Plan: Extubation in OR  Informed Consent: I have reviewed the patients History and Physical, chart, labs and discussed the procedure including the risks, benefits and alternatives for the proposed anesthesia with the patient or authorized representative who has indicated his/her understanding and acceptance.     Dental advisory given  Plan Discussed with: CRNA  Anesthesia Plan Comments:        Anesthesia Quick Evaluation

## 2022-08-12 NOTE — Discharge Instructions (Addendum)
Cardiac Ablation, Care After  This sheet gives you information about how to care for yourself after your procedure. Your health care provider may also give you more specific instructions. If you have problems or questions, contact your health care provider. What can I expect after the procedure? After the procedure, it is common to have: Bruising around your puncture site. Tenderness around your puncture site. Skipped heartbeats. If you had an atrial fibrillation ablation, you may have atrial fibrillation during the first several months after your procedure.  Tiredness (fatigue).  Follow these instructions at home: Puncture site care  Follow instructions from your health care provider about how to take care of your puncture site. Make sure you: If present, leave stitches (sutures), skin glue, or adhesive strips in place. These skin closures may need to stay in place for up to 2 weeks. If adhesive strip edges start to loosen and curl up, you may trim the loose edges. Do not remove adhesive strips completely unless your health care provider tells you to do that. If a large square bandage is present, this may be removed 24 hours after surgery.  Check your puncture site every day for signs of infection. Check for: Redness, swelling, or pain. Fluid or blood. If your puncture site starts to bleed, lie down on your back, apply firm pressure to the area, and contact your health care provider. Warmth. Pus or a bad smell. A pea or small marble sized lump at the site is normal and can take up to three months to resolve.  Driving Do not drive for at least 4 days after your procedure or however long your health care provider recommends. (Do not resume driving if you have previously been instructed not to drive for other health reasons.) Do not drive or use heavy machinery while taking prescription pain medicine. Activity Avoid activities that take a lot of effort for at least 7 days after your  procedure. Do not lift anything that is heavier than 5 lb (4.5 kg) for one week.  No sexual activity for 1 week.  Return to your normal activities as told by your health care provider. Ask your health care provider what activities are safe for you. General instructions Take over-the-counter and prescription medicines only as told by your health care provider. Do not use any products that contain nicotine or tobacco, such as cigarettes and e-cigarettes. If you need help quitting, ask your health care provider. You may shower after 24 hours, but Do not take baths, swim, or use a hot tub for 1 week.  Do not drink alcohol for 24 hours after your procedure. Keep all follow-up visits as told by your health care provider. This is important. Contact a health care provider if: You have redness, mild swelling, or pain around your puncture site. You have fluid or blood coming from your puncture site that stops after applying firm pressure to the area. Your puncture site feels warm to the touch. You have pus or a bad smell coming from your puncture site. You have a fever. You have chest pain or discomfort that spreads to your neck, jaw, or arm. You have chest pain that is worse with lying on your back or taking a deep breath. You are sweating a lot. You feel nauseous. You have a fast or irregular heartbeat. You have shortness of breath. You are dizzy or light-headed and feel the need to lie down. You have pain or numbness in the arm or leg closest to your puncture   site. Get help right away if: Your puncture site suddenly swells. Your puncture site is bleeding and the bleeding does not stop after applying firm pressure to the area. These symptoms may represent a serious problem that is an emergency. Do not wait to see if the symptoms will go away. Get medical help right away. Call your local emergency services (911 in the [place] Do not drive yourself to the hospital. Summary After the procedure, it  is normal to have bruising and tenderness at the puncture site in your groin, neck, or forearm. Check your puncture site every day for signs of infection. Get help right away if your puncture site is bleeding and the bleeding does not stop after applying firm pressure to the area. This is a medical emergency. This information is not intended to replace advice given to you by your health care provider. Make sure you discuss any questions you have with your health care provider.  You have an appointment set up with the Atrial Fibrillation Clinic.  Multiple studies have shown that being followed by a dedicated atrial fibrillation clinic in addition to the standard care you receive from your other physicians improves health. We believe that enrollment in the atrial fibrillation clinic will allow [place] to better care for you.   The phone number to the Atrial Fibrillation Clinic is [phone]. The clinic is staffed Monday through Friday from 8:30am to 5pm.  Directions: The clinic is located in the [facility], 6TH FLOOR Enter the hospital at the MAIN ENTRANCE "A", use [employer] to the 6th floor.  Registration desk to the right of elevators on 6th floor  If you have any trouble locating the clinic, please don't hesitate to call [phone].   You have an appointment set up with the Atrial Fibrillation Clinic.  Multiple studies have shown that being followed by a dedicated atrial fibrillation clinic in addition to the standard care you receive from your other physicians improves health. We believe that enrollment in the atrial fibrillation clinic will allow [place] to better care for you.   The phone number to the Atrial Fibrillation Clinic is [phone]. The clinic is staffed Monday through Friday from 8:30am to 5pm.  Directions: The clinic is located in the [facility], 6TH FLOOR Enter the hospital at the MAIN ENTRANCE "A", use [employer] to the 6th floor.   Registration desk to the right of elevators on 6th floor  If you have any trouble locating the clinic, please don't hesitate to call [phone].

## 2022-08-12 NOTE — Interval H&P Note (Signed)
History and Physical Interval Note:  08/12/2022 10:40 AM  Holly Mcconnell  has presented today for surgery, with the diagnosis of afib.  The various methods of treatment have been discussed with the patient and family. After consideration of risks, benefits and other options for treatment, the patient has consented to  Procedure(s): ATRIAL FIBRILLATION ABLATION (N/A) TRANSESOPHAGEAL ECHOCARDIOGRAM (N/A) as a surgical intervention.  The patient's history has been reviewed, patient examined, no change in status, stable for surgery.  I have reviewed the patient's chart and labs.  Questions were answered to the patient's satisfaction.     [name]

## 2022-08-12 NOTE — Anesthesia Postprocedure Evaluation (Signed)
Anesthesia Post Note  Patient: Holly Mcconnell  Procedure(s) Performed: ATRIAL FIBRILLATION ABLATION TRANSESOPHAGEAL ECHOCARDIOGRAM     Patient location during evaluation: PACU Anesthesia Type: General Level of consciousness: awake and alert Pain management: pain level controlled Vital Signs Assessment: post-procedure vital signs reviewed and stable Respiratory status: spontaneous breathing, nonlabored ventilation and respiratory function stable Cardiovascular status: blood pressure returned to baseline and stable Postop Assessment: no apparent nausea or vomiting Anesthetic complications: no   There were no known notable events for this encounter.  Last Vitals:  Vitals:   08/12/22 1500 08/12/22 1515  BP: (!) 112/56 (!) 116/58  Pulse: 67 73  Resp: 12 16  Temp:    SpO2: 92% 93%    Last Pain:  Vitals:   08/12/22 1440  TempSrc:   PainSc: 0-No pain                 Lowella Curb

## 2022-08-12 NOTE — CV Procedure (Signed)
    TRANSESOPHAGEAL ECHOCARDIOGRAM   NAME:  [place]   MRN: [id number] DOB:  [birth year]   ADMIT DATE: 08/12/2022  INDICATIONS:  Atrial fib (pre-ablation)  PROCEDURE:   Informed consent was obtained prior to the procedure. The risks, benefits and alternatives for the procedure were discussed and the patient comprehended these risks.  Risks include, but are not limited to, cough, sore throat, vomiting, nausea, somnolence, esophageal and stomach trauma or perforation, bleeding, low blood pressure, aspiration, pneumonia, infection, trauma to the teeth and death.    The patient was already intubated and sedated by anesthesia service. After a procedural time-out, the transesophageal probe was inserted in the esophagus and stomach without difficulty and multiple views were obtained.    COMPLICATIONS:    There were no immediate complications.  FINDINGS:  LEFT VENTRICLE: EF = 60-65%. No regional wall motion abnormalities.  RIGHT VENTRICLE: Normal size and function.   LEFT ATRIUM: Severely dilated 5.4cm  LEFT ATRIAL APPENDAGE: No thrombus.   RIGHT ATRIUM: Normal  AORTIC VALVE:  Trileaflet. No AS/AI  MITRAL VALVE:    Normal. Trivial MR  TRICUSPID VALVE: Normal. Trivial TR  PULMONIC VALVE: Grossly normal.  INTERATRIAL SEPTUM: No PFO or ASD.  PERICARDIUM: No effusion  DESCENDING AORTA: Mild plaque   Jarone Ostergaard,MD 10:56 AM

## 2022-08-12 NOTE — H&P (Signed)
Electrophysiology Office Note:     Date:  08/12/2022    ID:  Holly Mcconnell, DOB [birth year], MRN [id number]   PCP:  Holly Dimitri, MD            [facility] Cardiologist:  Holly Lemma, MD  [facility] Electrophysiologist:  Holly Prude, MD    Referring MD: Holly Dimitri, MD    Chief Complaint: Atrial flutter   History of Present Illness:     Holly Mcconnell is a 77 y.o. female who presents for an evaluation of atrial flutter at the request of Dr. Bjorn Pippin. Their medical history includes hypertension, cerebral palsy, atrial flutter.  The patient last saw Dr. Bjorn Pippin March 18, 2022.  She had recently worn a ZIO monitor for 14 days which showed a 9% atrial flutter burden with an average rate of 134 bpm. At the last appointment with Dr. Bjorn Pippin, Eliquis was started.  The patient prefers to avoid long-term exposure to anticoagulation given her history of cerebral palsy and history of falls.   Today, she is accompanied by her husband. She has been following up with Dr. Bjorn Pippin for atrial fibrillation and flutter.    She has had 2 falls within the last year. When she falls she tends to land on her knees.    She reported that her mother had a pacemaker.   Presents for PVI and CTI ablation today. Procedures reviewed.  She denies any palpitations, chest pain, shortness of breath, or peripheral edema. No lightheadedness, headaches, syncope, orthopnea, or PND.       Past Medical History:  Diagnosis Date   Cerebral palsy (HCC)      Mild symptoms.   Migraine headache             Past Surgical History:  Procedure Laterality Date   CHOLECYSTECTOMY   1989   TEMPOROMANDIBULAR JOINT ARTHROPLASTY       Thyroid nodule resection   2005   TOTAL ABDOMINAL HYSTERECTOMY W/ BILATERAL SALPINGOOPHORECTOMY   1982      Current Medications: Active Medications      Current Meds  Medication Sig   acetaminophen (TYLENOL) 650 MG CR tablet 1 dose as needed    apixaban (ELIQUIS) 5 MG TABS tablet Take 1 tablet (5 mg total) by mouth 2 (two) times daily.   atorvastatin (LIPITOR) 20 MG tablet 1 tablet   calcium-vitamin D (OSCAL WITH D) 500-200 MG-UNIT tablet Take 1 tablet by mouth.   ibandronate (BONIVA) 150 MG tablet 1 tablet   levothyroxine (SYNTHROID, LEVOTHROID) 75 MCG tablet TK 1 T PO QD IN THE MORNING OES   MAGNESIUM PO Take by mouth daily.   metoprolol succinate (TOPROL-XL) 50 MG 24 hr tablet Take 1 tablet (50 mg total) by mouth daily.   Multiple Vitamins-Minerals (MULTIVITAMIN ADULT PO) Take by mouth.   SUMAtriptan (IMITREX) 100 MG tablet Take 100 mg by mouth every 2 (two) hours as needed for migraine. May repeat in 2 hours if headache persists or recurs.   topiramate (TOPAMAX) 25 MG tablet Take 25 mg by mouth daily.        Allergies:   Clavulanic acid, Nitrofurantoin, Amoxicillin, Nabumetone, Other, Sulfa antibiotics, and Codeine    Social History         Socioeconomic History   Marital status: Married      Spouse name: Not on file   Number of children: 1   Years of education: Not on file   Highest education level: Some college, no degree  Occupational History   Not on file  Tobacco Use   Smoking status: Never   Smokeless tobacco: Never  Substance and Sexual Activity   Alcohol use: Never   Drug use: Never   Sexual activity: Not on file  Other Topics Concern   Not on file  Social History Narrative    She is retired.  Formerly worked in Community education officer.  She is married (husband's name is Michele Mcalpine -married in January 03, 1967).    They have 1 child and 8 biologic grandkids and one adopted grandkid.  She lives with her husband and is very active.      She does cardio-strength exercises with a trainer roughly 5 days a week for 30+ minutes at a time.         She walks with a walker and has some mild speech deficits and musculoskeletal deficits from history of cerebral palsy syndrome. -- >  Does have a handicap placard         Social  Determinants of Manufacturing engineer Strain: Not on file  Food Insecurity: Not on file  Transportation Needs: Not on file  Physical Activity: Not on file  Stress: Not on file  Social Connections: Not on file      Family History: The patient's family history includes Atrial fibrillation in her maternal grandfather and maternal uncle; Atrial fibrillation (age of onset: 46) in her mother; Breast cancer in her maternal grandmother; CAD in her mother; COPD in her mother; Colon cancer in her maternal grandmother; Hodgkin's lymphoma in her father; Hypercholesterolemia in her mother; Hyperlipidemia in her sister; Hypertension in her mother and sister; Hypothyroidism in her mother; Lung cancer in her father; Pancreatic cancer in her sister; Stroke in her mother.   ROS:   Please see the history of present illness.    (+) Falls (2 within last year) All other systems reviewed and are negative.   EKGs/Labs/Other Studies Reviewed:     The following studies were reviewed today:   March 20, 2022 echo EF 60 RV normal mild MR   March 16, 2022 ZIO monitor 9% burden of atrial flutter/fibrillation with an average rate of 134 bpm Symptom triggered episodes corresponded to atrial flutter/fib events Occasional ventricular ectopy, 3% Automated report only mentions atrial flutter but rhythm strip (see below) shows both atrial fibrillation and flutter.       EKG:  The ekg ordered today demonstrates sinus rhythm.  PVC.  QTc 429 ms.     Recent Labs: 03/18/2022: BUN 20; Creatinine, Ser 0.76; Hemoglobin 13.0; Platelets 299; Potassium 4.4; Sodium 143  Recent Lipid Panel Labs (Brief)  No results found for: "CHOL", "TRIG", "HDL", "CHOLHDL", "VLDL", "LDLCALC", "LDLDIRECT"     Physical Exam:     VS:  BP 127/73   Pulse 59   Ht 4' 11.5" (1.511 m)   Wt 145 lb (65.8 kg)   SpO2 98%   BMI 28.80 kg/m         Wt Readings from Last 3 Encounters:  04/15/22 145 lb (65.8 kg)  03/18/22 149 lb  12.8 oz (67.9 kg)  02/17/22 149 lb 3.2 oz (67.7 kg)      GEN: Well nourished, well developed in no acute distress CARDIAC: RRR, no murmurs, rubs, gallops       Assessment ASSESSMENT:     1. Atrial flutter, unspecified type (HCC)     PLAN:     In order of problems listed above:     #Atrial fibrillation  and flutter Symptomatic.  9% burden.  On Eliquis for stroke prophylaxis.  Both atrial fibrillation and flutter present on ZIO monitor.  Discussed treatment options including catheter ablation and antiarrhythmic drug therapy and the patient like to avoid long-term exposure medications.  I think she is an overall acceptable candidate for catheter ablation.  She would like to avoid long-term exposure to anticoagulation given her history of falls which I think is very reasonable.  We discussed left atrial appendage occlusion as a mechanism to mitigate the stroke risk and avoid long-term exposure anticoagulation.  She would like to proceed with evaluation for this procedure.  Will plan to do the Watchman procedure 8 weeks after catheter ablation.   ----------------------------- Discussed treatment options today for AF including antiarrhythmic drug therapy and ablation. Discussed risks, recovery and likelihood of success with each treatment strategy. Risk, benefits, and alternatives to EP study and radiofrequency ablation for afib were discussed. These risks include but are not limited to stroke, bleeding, vascular damage, tamponade, perforation, damage to the esophagus, lungs, phrenic nerve and other structures, pulmonary vein stenosis, worsening renal function, and death.  Discussed potential need for repeat ablation procedures and antiarrhythmic drugs after an initial ablation. The patient understands these risk and wishes to proceed.  We will therefore proceed with catheter ablation at the next available time.  Carto, ICE, anesthesia are requested for the procedure.  Will also obtain CT PV protocol  prior to the procedure to exclude LAA thrombus and further evaluate atrial anatomy.  Presents for PVI/CTI. Procedure reviewed. Plan for TEE prior to the procedure to evaluate for LAA thrombus.      Signed, Rossie Muskrat. Lalla Brothers, MD, [facility], [facility] 08/12/2022 Electrophysiology [facility]

## 2022-08-13 ENCOUNTER — Encounter (HOSPITAL_COMMUNITY): Payer: Self-pay | Admitting: Cardiology

## 2022-08-13 MED FILL — Fentanyl Citrate Preservative Free (PF) Inj 100 MCG/2ML: INTRAMUSCULAR | Qty: 2 | Status: AC

## 2022-08-14 ENCOUNTER — Telehealth: Payer: Self-pay

## 2022-08-15 LAB — ECHO TEE

## 2022-08-20 ENCOUNTER — Ambulatory Visit: Payer: PPO | Admitting: Cardiology

## 2022-08-24 NOTE — Progress Notes (Unsigned)
Cardiology Office Note:    Date:  08/26/2022   ID:  Holly Mcconnell, DOB 07/02/1945, MRN [id number]  PCP:  Gweneth Dimitri, MD  Cardiologist:  Bryan Lemma, MD  Electrophysiologist:  Lanier Prude, MD   Referring MD: Gweneth Dimitri, MD   Chief Complaint  Patient presents with   Atrial Flutter    History of Present Illness:    Holly Mcconnell is a 77 y.o. female with a hx of atrial flutter, hypertension, cerebral palsy who presents for follow-up.  She previously followed with Dr. Herbie Baltimore, last seen 01/2021.  Wore 30-day monitor 03/2018, had episode of possible atach vs aflutter, lasted 10 minutes.  Suspecting likely atrial tachycardia and she was started on nadolol and has had no further palpitations.  Switched in 08/2021 to metoprolol.    Zio patch x 14 days on 03/12/2022 showed 9% atrial flutter burden, average rate 134 bpm with longest episode lasting 1 hour with average rate 147 bpm, occasional PVCs (3% of beats).  Echocardiogram 03/20/2022 showed normal biventricular function, no significant valvular disease.  Referred to Dr. Lalla Brothers in EP, underwent successful A-fib/flutter ablation 08/12/2022.  Watchman procedure planned.  Since last clinic visit, she reports she is doing well.  Denies any chest pain, dyspnea, lightheadedness, syncope, lower extremity edema, or palpitations.  No bleeding on Eliquis.   BP Readings from Last 3 Encounters:  08/26/22 (!) 92/50  08/12/22 117/80  08/05/22 113/73      Past Medical History:  Diagnosis Date   Cerebral palsy (HCC)    Mild symptoms.   Migraine headache     Past Surgical History:  Procedure Laterality Date   ATRIAL FIBRILLATION ABLATION N/A 08/12/2022   Procedure: ATRIAL FIBRILLATION ABLATION;  Surgeon: Lanier Prude, MD;  Location: [facility] INVASIVE CV LAB;  Service: Cardiovascular;  Laterality: N/A;   CHOLECYSTECTOMY  1989   TEE WITHOUT CARDIOVERSION N/A 08/12/2022   Procedure: TRANSESOPHAGEAL ECHOCARDIOGRAM;   Surgeon: Lanier Prude, MD;  Location: [facility] INVASIVE CV LAB;  Service: Cardiovascular;  Laterality: N/A;   TEMPOROMANDIBULAR JOINT ARTHROPLASTY     Thyroid nodule resection  2005   TOTAL ABDOMINAL HYSTERECTOMY W/ BILATERAL SALPINGOOPHORECTOMY  1982    Current Medications: Current Meds  Medication Sig   apixaban (ELIQUIS) 5 MG TABS tablet Take 1 tablet (5 mg total) by mouth 2 (two) times daily.   atorvastatin (LIPITOR) 20 MG tablet Take 20 mg by mouth at bedtime.   Calcium Carbonate-Vit D-Min (CALCIUM 1200 PO) Take 1 tablet by mouth every evening.   ibandronate (BONIVA) 150 MG tablet Take 150 mg by mouth every 30 (thirty) days.   levothyroxine (SYNTHROID, LEVOTHROID) 75 MCG tablet Take 37.5-75 mcg by mouth See admin instructions. Take 0.5 tablet (37.5 mcg) by mouth in the morning on Sundays.  Take 1 tablet (75 mcg) by mouth once daily in the morning on all other days.   MAGNESIUM PO Take 250 mg by mouth every evening.   Multiple Vitamin (MULTIVITAMIN WITH MINERALS) TABS tablet Take 1 tablet by mouth every evening.   pantoprazole (PROTONIX) 40 MG tablet Take 1 tablet (40 mg total) by mouth daily.   Polyethyl Glycol-Propyl Glycol (SYSTANE) 0.4-0.3 % SOLN Place 1 drop into both eyes in the morning and at bedtime.   SUMAtriptan (IMITREX) 100 MG tablet Take 100 mg by mouth every 2 (two) hours as needed for migraine. May repeat in 2 hours if headache persists or recurs.   topiramate (TOPAMAX) 25 MG tablet Take 25 mg by mouth at  bedtime.   [DISCONTINUED] metoprolol succinate (TOPROL-XL) 50 MG 24 hr tablet Take 1 tablet (50 mg total) by mouth daily. (Patient taking differently: Take 50 mg by mouth at bedtime.)     Allergies:   Clavulanic acid, Nitrofurantoin, Amoxicillin, Nabumetone, Other, Sulfa antibiotics, and Codeine   Social History   Socioeconomic History   Marital status: Married    Spouse name: Not on file   Number of children: 1   Years of education: Not on file   Highest  education level: Some college, no degree  Occupational History   Not on file  Tobacco Use   Smoking status: Never   Smokeless tobacco: Never  Substance and Sexual Activity   Alcohol use: Never   Drug use: Never   Sexual activity: Not on file  Other Topics Concern   Not on file  Social History Narrative   She is retired.  Formerly worked in Community education officer.  She is married (husband's name is Holly Mcconnell -married in January 03, 1967).   They have 1 child and 8 biologic grandkids and one adopted grandkid.  She lives with her husband and is very active.     She does cardio-strength exercises with a trainer roughly 5 days a week for 30+ minutes at a time.      She walks with a walker and has some mild speech deficits and musculoskeletal deficits from history of cerebral palsy syndrome. -- >  Does have a handicap placard      Social Determinants of Corporate investment banker Strain: Not on file  Food Insecurity: Not on file  Transportation Needs: Not on file  Physical Activity: Not on file  Stress: Not on file  Social Connections: Not on file     Family History: The patient's family history includes Atrial fibrillation in her maternal grandfather and maternal uncle; Atrial fibrillation (age of onset: 1) in her mother; Breast cancer in her maternal grandmother; CAD in her mother; COPD in her mother; Colon cancer in her maternal grandmother; Hodgkin's lymphoma in her father; Hypercholesterolemia in her mother; Hyperlipidemia in her sister; Hypertension in her mother and sister; Hypothyroidism in her mother; Lung cancer in her father; Pancreatic cancer in her sister; Stroke in her mother.  ROS:   Please see the history of present illness.     All other systems reviewed and are negative.  EKGs/Labs/Other Studies Reviewed:    The following studies were reviewed today:   EKG:   02/17/2022: Normal sinus rhythm, rate 70, low voltage, no ST abnormalities 03/18/2022: Normal sinus rhythm, rate 75, no  ST abnormality  Recent Labs: 07/29/2022: BUN 19; Creatinine, Ser 0.77; Hemoglobin 12.7; Platelets 237; Potassium 4.2; Sodium 140  Recent Lipid Panel No results found for: "CHOL", "TRIG", "HDL", "CHOLHDL", "VLDL", "LDLCALC", "LDLDIRECT"  Physical Exam:    VS:  BP (!) 92/50   Pulse 60   Ht 4' 11.5" (1.511 m)   Wt 144 lb (65.3 kg)   SpO2 95%   BMI 28.60 kg/m     Wt Readings from Last 3 Encounters:  08/26/22 144 lb (65.3 kg)  08/12/22 140 lb (63.5 kg)  04/15/22 145 lb (65.8 kg)     GEN:  Well nourished, well developed in no acute distress HEENT: Normal NECK: No JVD; No carotid bruits LYMPHATICS: No lymphadenopathy CARDIAC: RRR, no murmurs, rubs, gallops RESPIRATORY:  Clear to auscultation without rales, wheezing or rhonchi  ABDOMEN: Soft, non-tender, non-distended MUSCULOSKELETAL:  No edema; No deformity  SKIN: Warm and dry NEUROLOGIC:  Alert and oriented x 3 PSYCHIATRIC:  Normal affect   ASSESSMENT:    1. Atrial flutter, unspecified type (HCC)   2. Essential hypertension   3. Hyperlipidemia, unspecified hyperlipidemia type      PLAN:    Atrial flutter: Zio patch x 14 days on 03/12/2022 showed 9% atrial flutter burden, average rate 134 bpm with longest episode lasting 1 hour with average rate 147 bpm, occasional PVCs (3% of beats).  Echocardiogram 03/20/2022 showed normal biventricular function, no significant valvular disease.  Referred to Dr. Lalla Brothers in EP, underwent successful A-fib/flutter ablation 08/12/2022 -Continue toprol-XL, will reduce it to 25 mg daily given soft BP in clinic today.  Check echo to ensure no effusion given soft BP after ablation procedure -CHA2DS2-VASc 4 (age x 2, hypertension, female).  Continue Eliquis 5 mg twice daily.  She does have a history of falls with cerebral palsy and would prefer to avoid long-term anticoagulation.  Dr. Lalla Brothers planning Watchman procedure  Hypertension: On Toprol-XL 50 mg daily.  Soft BP in clinic today, she is  asymptomatic.  Will reduce dose to 25 mg daily  Hyperlipidemia: On atorvastatin 20 mg daily.  LDL 69 on 04/21/22. Calcium score 0 preablation CT 08/05/22   RTC in 6 months   Medication Adjustments/Labs and Tests Ordered: Current medicines are reviewed at length with the patient today.  Concerns regarding medicines are outlined above.  No orders of the defined types were placed in this encounter.  Meds ordered this encounter  Medications   metoprolol succinate (TOPROL-XL) 25 MG 24 hr tablet    Sig: Take 1 tablet (25 mg total) by mouth daily.    Dispense:  90 tablet    Refill:  3    Dose increase    Patient Instructions  Medication Instructions:  Decrease Toprol XL to 25 mg daily *If you need a refill on your cardiac medications before your next appointment, please call your pharmacy*  Follow-Up: At [facility], you and your health needs are our priority.  As part of our continuing mission to provide you with exceptional heart care, we have created designated Provider Care Teams.  These Care Teams include your primary Cardiologist (physician) and Advanced Practice Providers (APPs -  Physician Assistants and Nurse Practitioners) who all work together to provide you with the care you need, when you need it.  We recommend signing up for the patient portal called "MyChart".  Sign up information is provided on this After Visit Summary.  MyChart is used to connect with patients for Virtual Visits (Telemedicine).  Patients are able to view lab/test results, encounter notes, upcoming appointments, etc.  Non-urgent messages can be sent to your provider as well.   To learn more about what you can do with MyChart, go to [email or web address].    Your next appointment:   6 month(s)  Provider:   Dr Bjorn Pippin    Signed, Little Ishikawa, MD  08/26/2022 9:26 AM    [facility]

## 2022-08-26 ENCOUNTER — Ambulatory Visit: Payer: PPO | Attending: Cardiology | Admitting: Cardiology

## 2022-08-26 VITALS — BP 92/50 | HR 60 | Ht 59.5 in | Wt 144.0 lb

## 2022-08-26 DIAGNOSIS — E785 Hyperlipidemia, unspecified: Secondary | ICD-10-CM

## 2022-08-26 DIAGNOSIS — I4892 Unspecified atrial flutter: Secondary | ICD-10-CM | POA: Diagnosis not present

## 2022-08-26 DIAGNOSIS — I10 Essential (primary) hypertension: Secondary | ICD-10-CM

## 2022-08-26 MED ORDER — METOPROLOL SUCCINATE ER 25 MG PO TB24: 25.0000 mg | ORAL_TABLET | Freq: Every day | ORAL | 3 refills | Status: DC

## 2022-08-26 NOTE — Patient Instructions (Addendum)
Medication Instructions:  Decrease Toprol XL to 25 mg daily *If you need a refill on your cardiac medications before your next appointment, please call your pharmacy*  Test: Your physician has requested that you have an echocardiogram. Echocardiography is a painless test that uses sound waves to create images of your heart. It provides your doctor with information about the size and shape of your heart and how well your heart's chambers and valves are working. This procedure takes approximately one hour. There are no restrictions for this procedure. Please do NOT wear cologne, perfume, aftershave, or lotions (deodorant is allowed). Please arrive 15 minutes prior to your appointment time.   Follow-Up: At [facility], you and your health needs are our priority.  As part of our continuing mission to provide you with exceptional heart care, we have created designated Provider Care Teams.  These Care Teams include your primary Cardiologist (physician) and Advanced Practice Providers (APPs -  Physician Assistants and Nurse Practitioners) who all work together to provide you with the care you need, when you need it.  We recommend signing up for the patient portal called "MyChart".  Sign up information is provided on this After Visit Summary.  MyChart is used to connect with patients for Virtual Visits (Telemedicine).  Patients are able to view lab/test results, encounter notes, upcoming appointments, etc.  Non-urgent messages can be sent to your provider as well.   To learn more about what you can do with MyChart, go to [email or web address].    Your next appointment:   Friday December 27 th at 0900

## 2022-08-26 NOTE — Addendum Note (Signed)
Addended by: Scheryl Marten on: 08/26/2022 09:30 AM   Modules accepted: Orders

## 2022-08-28 ENCOUNTER — Encounter: Payer: Self-pay | Admitting: Cardiology

## 2022-09-02 DIAGNOSIS — E782 Mixed hyperlipidemia: Secondary | ICD-10-CM | POA: Diagnosis not present

## 2022-09-02 DIAGNOSIS — G809 Cerebral palsy, unspecified: Secondary | ICD-10-CM | POA: Diagnosis not present

## 2022-09-02 DIAGNOSIS — E663 Overweight: Secondary | ICD-10-CM | POA: Diagnosis not present

## 2022-09-02 DIAGNOSIS — Z6826 Body mass index (BMI) 26.0-26.9, adult: Secondary | ICD-10-CM | POA: Diagnosis not present

## 2022-09-05 ENCOUNTER — Telehealth: Payer: Self-pay

## 2022-09-05 ENCOUNTER — Other Ambulatory Visit: Payer: Self-pay

## 2022-09-05 DIAGNOSIS — I4891 Unspecified atrial fibrillation: Secondary | ICD-10-CM

## 2022-09-05 NOTE — Telephone Encounter (Signed)
Discussed with Georgie Chard, NP. She personally spoke with the patient who wishes to proceed with LAAO on 10/30/2022. Pre-procedure visit scheduled 10/20/2022.

## 2022-09-11 ENCOUNTER — Ambulatory Visit (HOSPITAL_COMMUNITY)
Admission: RE | Admit: 2022-09-11 | Discharge: 2022-09-11 | Disposition: A | Discharge status: Home / Self Care | Payer: PPO | Source: Ambulatory Visit | Attending: Physician Assistant | Admitting: Physician Assistant

## 2022-09-11 VITALS — BP 116/66 | HR 73 | Ht 59.5 in | Wt 143.0 lb

## 2022-09-11 DIAGNOSIS — I10 Essential (primary) hypertension: Secondary | ICD-10-CM | POA: Diagnosis not present

## 2022-09-11 DIAGNOSIS — R296 Repeated falls: Secondary | ICD-10-CM | POA: Diagnosis not present

## 2022-09-11 DIAGNOSIS — D6869 Other thrombophilia: Secondary | ICD-10-CM | POA: Diagnosis not present

## 2022-09-11 DIAGNOSIS — I480 Paroxysmal atrial fibrillation: Secondary | ICD-10-CM

## 2022-09-11 DIAGNOSIS — Z7901 Long term (current) use of anticoagulants: Secondary | ICD-10-CM | POA: Insufficient documentation

## 2022-09-11 DIAGNOSIS — G809 Cerebral palsy, unspecified: Secondary | ICD-10-CM | POA: Insufficient documentation

## 2022-09-11 DIAGNOSIS — I4892 Unspecified atrial flutter: Secondary | ICD-10-CM | POA: Insufficient documentation

## 2022-09-11 NOTE — Progress Notes (Signed)
Primary Care Physician: Gweneth Dimitri, MD Primary Cardiologist: Little Ishikawa, MD Electrophysiologist: Lanier Prude, MD  Referring Physician: Dr Lalla Brothers   Holly Mcconnell is a 77 y.o. female with a history of atrial flutter, hypertension, cerebral palsy, atrial fibrillation who presents for follow up in the [facility]. She wore a ZIO monitor 03/2022 for 14 days which showed a 9% atrial flutter burden with an average rate of 134 bpm. She was seen by Dr Lalla Brothers and underwent afib and flutter ablation on 08/12/22. Patient is on Eliquis for a CHADS2VASC score of 4. There is concern about bleeding risk with her h/o falls and she is scheduled for Watchman implant 10/30/22.  On follow up today, patient reports that she has done well since her ablation. She did have one brief episode lasting < 5 minutes but otherwise has stayed in SR. She denies chest pain, swallowing pain, or groin issues. No bleeding issues on anticoagulation.   Today, she denies symptoms of palpitations, chest pain, shortness of breath, orthopnea, PND, lower extremity edema, dizziness, presyncope, syncope, snoring, daytime somnolence, bleeding, or neurologic sequela. The patient is tolerating medications without difficulties and is otherwise without complaint today.    Atrial Fibrillation Risk Factors:  she does not have symptoms or diagnosis of sleep apnea. she does not have a history of rheumatic fever.   Atrial Fibrillation Management history:  Previous antiarrhythmic drugs: none Previous cardioversions: none Previous ablations: 08/12/22 Anticoagulation history: Eliquis  ROS- All systems are reviewed and negative except as per the HPI above.   Physical Exam: BP 116/66   Pulse 73   Ht 4' 11.5" (1.511 m)   Wt 64.9 kg   BMI 28.40 kg/m   GEN: Well nourished, well developed in no acute distress NECK: No JVD; No carotid bruits CARDIAC: Regular rate and rhythm, no  murmurs, rubs, gallops RESPIRATORY:  Clear to auscultation without rales, wheezing or rhonchi  ABDOMEN: Soft, non-tender, non-distended EXTREMITIES:  No edema; No deformity   Wt Readings from Last 3 Encounters:  09/11/22 64.9 kg  08/26/22 65.3 kg  08/12/22 63.5 kg     EKG today demonstrates  SR Vent. rate 73 BPM PR interval 160 ms QRS duration 84 ms QT/QTcB 392/431 ms  Echo 03/20/22 demonstrated   1. Left ventricular ejection fraction, by estimation, is 60 to 65%. The  left ventricle has normal function. The left ventricle has no regional  wall motion abnormalities. Left ventricular diastolic parameters were  normal.   2. Right ventricular systolic function is normal. The right ventricular  size is normal. Tricuspid regurgitation signal is inadequate for assessing  PA pressure.   3. The mitral valve is abnormal. Mild mitral valve regurgitation.   4. The aortic valve is tricuspid. Aortic valve regurgitation is not  visualized.   5. The inferior vena cava is dilated in size with >50% respiratory  variability, suggesting right atrial pressure of 8 mmHg.   Comparison(s): No prior Echocardiogram.     CHA2DS2-VASc Score = 4  The patient's score is based upon: CHF History: 0 HTN History: 1 Diabetes History: 0 Stroke History: 0 Vascular Disease History: 0 Age Score: 2 Gender Score: 1       ASSESSMENT AND PLAN: Paroxysmal Atrial Fibrillation/atrial flutter The patient's CHA2DS2-VASc score is 4, indicating a 4.8% annual risk of stroke.   S/p afib and flutter ablation 08/12/22 Patient appears to be maintaining SR Continue Eliquis 5 mg BID for now.   Continue Toprol 25  mg daily Apple Watch for home monitoring  Secondary Hypercoagulable State (ICD10:  D68.69) The patient is at significant risk for stroke/thromboembolism based upon her CHA2DS2-VASc Score of 4.  Continue Apixaban (Eliquis). Patient is scheduled for Watchman implant 10/30/22.  HTN Stable on current  regimen   Follow up with the structural heart team as scheduled.    Jorja Loa PA-C Afib Clinic [facility] [address] [place], [place] [address] [phone]

## 2022-09-23 ENCOUNTER — Encounter: Payer: Self-pay | Admitting: Cardiology

## 2022-09-24 ENCOUNTER — Ambulatory Visit (HOSPITAL_COMMUNITY): Payer: PPO | Attending: Cardiology

## 2022-09-24 DIAGNOSIS — M47816 Spondylosis without myelopathy or radiculopathy, lumbar region: Secondary | ICD-10-CM | POA: Diagnosis not present

## 2022-09-24 DIAGNOSIS — I4892 Unspecified atrial flutter: Secondary | ICD-10-CM | POA: Diagnosis not present

## 2022-09-24 DIAGNOSIS — M6281 Muscle weakness (generalized): Secondary | ICD-10-CM | POA: Diagnosis not present

## 2022-09-24 LAB — ECHOCARDIOGRAM COMPLETE
Area-P 1/2: 5.66 cm2
S' Lateral: 2.1 cm

## 2022-09-30 ENCOUNTER — Other Ambulatory Visit: Payer: Self-pay | Admitting: Cardiology

## 2022-09-30 DIAGNOSIS — E782 Mixed hyperlipidemia: Secondary | ICD-10-CM | POA: Diagnosis not present

## 2022-09-30 DIAGNOSIS — E663 Overweight: Secondary | ICD-10-CM | POA: Diagnosis not present

## 2022-09-30 DIAGNOSIS — M47816 Spondylosis without myelopathy or radiculopathy, lumbar region: Secondary | ICD-10-CM | POA: Diagnosis not present

## 2022-09-30 DIAGNOSIS — G809 Cerebral palsy, unspecified: Secondary | ICD-10-CM | POA: Diagnosis not present

## 2022-09-30 DIAGNOSIS — M6281 Muscle weakness (generalized): Secondary | ICD-10-CM | POA: Diagnosis not present

## 2022-09-30 DIAGNOSIS — I480 Paroxysmal atrial fibrillation: Secondary | ICD-10-CM

## 2022-09-30 DIAGNOSIS — Z6826 Body mass index (BMI) 26.0-26.9, adult: Secondary | ICD-10-CM | POA: Diagnosis not present

## 2022-10-01 NOTE — Telephone Encounter (Signed)
Eliquis 5mg  refill request received. Patient is 77 years old, weight-64.9kg, Crea-0.77 on 07/29/22, Diagnosis-Afib, and last seen by Alphonzo Severance on 09/11/22. Dose is appropriate based on dosing criteria. Will send in refill to requested pharmacy.

## 2022-10-02 DIAGNOSIS — M47816 Spondylosis without myelopathy or radiculopathy, lumbar region: Secondary | ICD-10-CM | POA: Diagnosis not present

## 2022-10-02 DIAGNOSIS — M6281 Muscle weakness (generalized): Secondary | ICD-10-CM | POA: Diagnosis not present

## 2022-10-06 DIAGNOSIS — M47816 Spondylosis without myelopathy or radiculopathy, lumbar region: Secondary | ICD-10-CM | POA: Diagnosis not present

## 2022-10-06 DIAGNOSIS — M6281 Muscle weakness (generalized): Secondary | ICD-10-CM | POA: Diagnosis not present

## 2022-10-08 DIAGNOSIS — M47816 Spondylosis without myelopathy or radiculopathy, lumbar region: Secondary | ICD-10-CM | POA: Diagnosis not present

## 2022-10-08 DIAGNOSIS — M6281 Muscle weakness (generalized): Secondary | ICD-10-CM | POA: Diagnosis not present

## 2022-10-13 DIAGNOSIS — G809 Cerebral palsy, unspecified: Secondary | ICD-10-CM | POA: Diagnosis not present

## 2022-10-13 DIAGNOSIS — M5459 Other low back pain: Secondary | ICD-10-CM | POA: Diagnosis not present

## 2022-10-13 DIAGNOSIS — M546 Pain in thoracic spine: Secondary | ICD-10-CM | POA: Diagnosis not present

## 2022-10-13 DIAGNOSIS — M4140 Neuromuscular scoliosis, site unspecified: Secondary | ICD-10-CM | POA: Diagnosis not present

## 2022-10-15 DIAGNOSIS — M5459 Other low back pain: Secondary | ICD-10-CM | POA: Diagnosis not present

## 2022-10-15 DIAGNOSIS — G809 Cerebral palsy, unspecified: Secondary | ICD-10-CM | POA: Diagnosis not present

## 2022-10-15 DIAGNOSIS — M546 Pain in thoracic spine: Secondary | ICD-10-CM | POA: Diagnosis not present

## 2022-10-15 DIAGNOSIS — M4140 Neuromuscular scoliosis, site unspecified: Secondary | ICD-10-CM | POA: Diagnosis not present

## 2022-10-17 DIAGNOSIS — M1711 Unilateral primary osteoarthritis, right knee: Secondary | ICD-10-CM | POA: Diagnosis not present

## 2022-10-20 ENCOUNTER — Ambulatory Visit: Payer: PPO | Attending: Cardiovascular Disease | Admitting: Cardiology

## 2022-10-20 VITALS — BP 108/60 | HR 74 | Ht 59.0 in | Wt 141.0 lb

## 2022-10-20 DIAGNOSIS — I4891 Unspecified atrial fibrillation: Secondary | ICD-10-CM | POA: Diagnosis not present

## 2022-10-20 DIAGNOSIS — I10 Essential (primary) hypertension: Secondary | ICD-10-CM

## 2022-10-20 DIAGNOSIS — I479 Paroxysmal tachycardia, unspecified: Secondary | ICD-10-CM | POA: Diagnosis not present

## 2022-10-20 DIAGNOSIS — M4140 Neuromuscular scoliosis, site unspecified: Secondary | ICD-10-CM | POA: Diagnosis not present

## 2022-10-20 DIAGNOSIS — G809 Cerebral palsy, unspecified: Secondary | ICD-10-CM

## 2022-10-20 DIAGNOSIS — I480 Paroxysmal atrial fibrillation: Secondary | ICD-10-CM

## 2022-10-20 DIAGNOSIS — M546 Pain in thoracic spine: Secondary | ICD-10-CM | POA: Diagnosis not present

## 2022-10-20 DIAGNOSIS — Z01818 Encounter for other preprocedural examination: Secondary | ICD-10-CM

## 2022-10-20 DIAGNOSIS — M5459 Other low back pain: Secondary | ICD-10-CM | POA: Diagnosis not present

## 2022-10-20 NOTE — Patient Instructions (Signed)
Medication Instructions:  Your physician recommends that you continue on your current medications as directed. Please refer to the Current Medication list given to you today.  *If you need a refill on your cardiac medications before your next appointment, please call your pharmacy*   Lab Work: CBC, BMET If you have labs (blood work) drawn today and your tests are completely normal, you will receive your results only by: MyChart Message (if you have MyChart) OR A paper copy in the mail If you have any lab test that is abnormal or we need to change your treatment, we will call you to review the results.   Follow-Up: At [facility], you and your health needs are our priority.  As part of our continuing mission to provide you with exceptional heart care, we have created designated Provider Care Teams.  These Care Teams include your primary Cardiologist (physician) and Advanced Practice Providers (APPs -  Physician Assistants and Nurse Practitioners) who all work together to provide you with the care you need, when you need it.    Your next appointment:   As scheduled per structural team

## 2022-10-21 LAB — CBC
Hematocrit: 38.4 % (ref 34.0–46.6)
Hemoglobin: 12.4 g/dL (ref 11.1–15.9)
MCH: 30 pg (ref 26.6–33.0)
MCHC: 32.3 g/dL (ref 31.5–35.7)
MCV: 93 fL (ref 79–97)
Platelets: 288 10*3/uL (ref 150–450)
RBC: 4.14 x10E6/uL (ref 3.77–5.28)
RDW: 12.4 % (ref 11.7–15.4)
WBC: 6.2 10*3/uL (ref 3.4–10.8)

## 2022-10-21 LAB — BASIC METABOLIC PANEL
BUN/Creatinine Ratio: 20 (ref 12–28)
BUN: 15 mg/dL (ref 8–27)
CO2: 20 mmol/L (ref 20–29)
Calcium: 9.5 mg/dL (ref 8.7–10.3)
Chloride: 107 mmol/L — ABNORMAL HIGH (ref 96–106)
Creatinine, Ser: 0.74 mg/dL (ref 0.57–1.00)
Glucose: 91 mg/dL (ref 70–99)
Potassium: 4.3 mmol/L (ref 3.5–5.2)
Sodium: 142 mmol/L (ref 134–144)
eGFR: 84 mL/min/{1.73_m2} (ref >59)

## 2022-10-21 NOTE — Progress Notes (Signed)
[facility]                                     Cardiology Office Note:    Date:  10/21/2022   ID:  Holly Mcconnell, DOB 12-24-1945, MRN [id number]  PCP:  Gweneth Dimitri, MD  [facility] Cardiologist:  Little Ishikawa, MD  [facility] Electrophysiologist:  Lanier Prude, MD   Referring MD: Gweneth Dimitri, MD   Chief Complaint  Patient presents with   Follow-up    Pre LAAO    History of Present Illness:    Holly Mcconnell is a 77 y.o. female with a hx of cerebral palsy, HTN, atrial flutter and atrial fibrillation who was referred to Dr. Lalla Brothers for LAAO evaluation.   Holly Mcconnell wore a ZIO monitor 03/2022 for 14 days which showed a 9% atrial flutter burden with an average rate of 134 bpm. She was seen by Dr Lalla Brothers and underwent afib and flutter ablation on 08/12/22. Given her hx of chronic falls plan was to proceed with Watchman after ablation to avoid long term anticoagulation.    In ablation follow up, she was doing well with no concerns. She is here today with her husband. She denies chest pain, SOB, palpitations, LE edema, orthopnea, PND, dizziness, or syncope. Denies bleeding in stool or urine. No recent falls.    Past Medical History:  Diagnosis Date   Cerebral palsy (HCC)    Mild symptoms.   Migraine headache     Past Surgical History:  Procedure Laterality Date   ATRIAL FIBRILLATION ABLATION N/A 08/12/2022   Procedure: ATRIAL FIBRILLATION ABLATION;  Surgeon: Lanier Prude, MD;  Location: [facility] INVASIVE CV LAB;  Service: Cardiovascular;  Laterality: N/A;   CHOLECYSTECTOMY  1989   TEE WITHOUT CARDIOVERSION N/A 08/12/2022   Procedure: TRANSESOPHAGEAL ECHOCARDIOGRAM;  Surgeon: Lanier Prude, MD;  Location: [facility] INVASIVE CV LAB;  Service: Cardiovascular;  Laterality: N/A;   TEMPOROMANDIBULAR JOINT ARTHROPLASTY     Thyroid nodule resection  2005   TOTAL ABDOMINAL HYSTERECTOMY W/ BILATERAL SALPINGOOPHORECTOMY  1982     Current Medications: Current Meds  Medication Sig   atorvastatin (LIPITOR) 20 MG tablet Take 20 mg by mouth at bedtime.   Calcium Carbonate-Vit D-Min (CALCIUM 1200 PO) Take 1,200 mg by mouth every evening.   ELIQUIS 5 MG TABS tablet TAKE 1 TABLET BY MOUTH TWICE A DAY   ibandronate (BONIVA) 150 MG tablet Take 150 mg by mouth every 30 (thirty) days.   levothyroxine (SYNTHROID, LEVOTHROID) 75 MCG tablet Take 37.5-75 mcg by mouth See admin instructions. Take 0.5 tablet (37.5 mcg) by mouth in the morning on Sundays.  Take 1 tablet (75 mcg) by mouth once daily in the morning on all other days.   MAGNESIUM PO Take 250 mg by mouth at bedtime.   metoprolol succinate (TOPROL-XL) 25 MG 24 hr tablet Take 1 tablet (25 mg total) by mouth daily.   Multiple Vitamin (MULTIVITAMIN WITH MINERALS) TABS tablet Take 1 tablet by mouth every evening.   Polyethyl Glycol-Propyl Glycol (SYSTANE) 0.4-0.3 % GEL ophthalmic gel Place 1 Application into both eyes at bedtime.   Polyethyl Glycol-Propyl Glycol (SYSTANE) 0.4-0.3 % SOLN Place 1 drop into both eyes in the morning and at bedtime.   SUMAtriptan (IMITREX) 100 MG tablet Take 100 mg by mouth every 2 (two) hours as needed for migraine. May repeat in 2 hours if  headache persists or recurs.   topiramate (TOPAMAX) 25 MG tablet Take 25 mg by mouth at bedtime.     Allergies:   Clavulanic acid, Nitrofurantoin, Amoxicillin, Nabumetone, Other, Sulfa antibiotics, and Codeine   Social History   Socioeconomic History   Marital status: Married    Spouse name: Not on file   Number of children: 1   Years of education: Not on file   Highest education level: Some college, no degree  Occupational History   Not on file  Tobacco Use   Smoking status: Never   Smokeless tobacco: Never  Substance and Sexual Activity   Alcohol use: Never   Drug use: Never   Sexual activity: Not on file  Other Topics Concern   Not on file  Social History Narrative   She is retired.   Formerly worked in Community education officer.  She is married (husband's name is Michele Mcalpine -married in January 03, 1967).   They have 1 child and 8 biologic grandkids and one adopted grandkid.  She lives with her husband and is very active.     She does cardio-strength exercises with a trainer roughly 5 days a week for 30+ minutes at a time.      She walks with a walker and has some mild speech deficits and musculoskeletal deficits from history of cerebral palsy syndrome. -- >  Does have a handicap placard      Social Determinants of Health   Financial Resource Strain: Low Risk  (12/07/2020)   Received [facility], [facility]   Overall Financial Resource Strain (CARDIA)    Difficulty of Paying Living Expenses: Not hard at all  Food Insecurity: No Food Insecurity (12/07/2020)   Received from [facility], [facility]   Hunger Vital Sign    Worried About Running Out of Food in the Last Year: Never true    Ran Out of Food in the Last Year: Never true  Transportation Needs: No Transportation Needs (12/07/2020)   Received from [employer], [facility]   PRAPARE - Transportation    Lack of Transportation (Medical): No    Lack of Transportation (Non-Medical): No  Physical Activity: Sufficiently Active (12/07/2020)   Received from [facility], [facility]   Exercise Vital Sign    Days of Exercise per Week: 5 days    Minutes of Exercise per Session: 60 min  Stress: No Stress Concern Present (12/07/2020)   Received from [facility], [facility] - Occupational Stress Questionnaire    Feeling of Stress : Only a little  Social Connections: Unknown (07/16/2021)   Received from [facility], [facility]   Social Network    Social Network: Not on file     Family History: The patient's family history includes Atrial fibrillation in her maternal grandfather and maternal uncle; Atrial fibrillation (age of onset: 26) in her mother; Breast cancer in her  maternal grandmother; CAD in her mother; COPD in her mother; Colon cancer in her maternal grandmother; Hodgkin's lymphoma in her father; Hypercholesterolemia in her mother; Hyperlipidemia in her sister; Hypertension in her mother and sister; Hypothyroidism in her mother; Lung cancer in her father; Pancreatic cancer in her sister; Stroke in her mother.  ROS:   Please see the history of present illness.    All other systems reviewed and are negative.  EKGs/Labs/Other Studies Reviewed:    The following studies were reviewed today:  Cardiac Studies & Procedures       ECHOCARDIOGRAM  ECHOCARDIOGRAM COMPLETE 09/24/2022  Narrative ECHOCARDIOGRAM REPORT    Patient Name:   Holly Mcconnell Date of Exam: 09/24/2022 Medical Rec #:  [id number]               Height:       59.5 in Accession #:    [id number]              Weight:       143.0 lb Date of Birth:  06-23-1945               BSA:          1.609 m Patient Age:    76 years                BP:           114/68 mmHg Patient Gender: F                       HR:           72 bpm. Exam Location:  [address]  Procedure: 2D Echo, Cardiac Doppler, Color Doppler and Strain Analysis  Indications:    I48.92* Unspecified atrial flutter  History:        Patient has prior history of Echocardiogram examinations, most recent 08/12/2022. Risk Factors:Hypertension and Dyslipidemia. Cerebral palsy. Paroxysmal tachycardia. Spinal stenosis.  Sonographer:    Cathie Beams RCS Referring Phys: [id number] CHRISTOPHER L SCHUMANN  IMPRESSIONS   1. Left ventricular ejection fraction, by estimation, is 55 to 60%. The left ventricle has normal function. The left ventricle has no regional wall motion abnormalities. Left ventricular diastolic parameters were normal. The average left ventricular global longitudinal strain is -20.1 %. The global longitudinal strain is normal. 2. Right ventricular systolic function is normal. The right ventricular size is  normal. There is normal pulmonary artery systolic pressure. The estimated right ventricular systolic pressure is 23.4 mmHg. 3. The mitral valve is grossly normal. Mild mitral valve regurgitation. No evidence of mitral stenosis. 4. The aortic valve is tricuspid. Aortic valve regurgitation is not visualized. No aortic stenosis is present. 5. The inferior vena cava is normal in size with greater than 50% respiratory variability, suggesting right atrial pressure of 3 mmHg.  FINDINGS Left Ventricle: Left ventricular ejection fraction, by estimation, is 55 to 60%. The left ventricle has normal function. The left ventricle has no regional wall motion abnormalities. The average left ventricular global longitudinal strain is -20.1 %. The global longitudinal strain is normal. The left ventricular internal cavity size was normal in size. There is no left ventricular hypertrophy. Left ventricular diastolic parameters were normal.  Right Ventricle: The right ventricular size is normal. No increase in right ventricular wall thickness. Right ventricular systolic function is normal. There is normal pulmonary artery systolic pressure. The tricuspid regurgitant velocity is 2.26 m/s, and with an assumed right atrial pressure of 3 mmHg, the estimated right ventricular systolic pressure is 23.4 mmHg.  Left Atrium: Left atrial size was normal in size.  Right Atrium: Right atrial size was normal in size.  Pericardium: There is no evidence of pericardial effusion.  Mitral Valve: The mitral valve is grossly normal. Mild mitral valve regurgitation. No evidence of mitral valve stenosis.  Tricuspid Valve: The tricuspid valve is grossly normal. Tricuspid valve regurgitation is trivial. No evidence of tricuspid stenosis.  Aortic Valve: The aortic valve is tricuspid. Aortic valve regurgitation is not visualized. No aortic stenosis is present.  Pulmonic Valve: The pulmonic valve  was grossly normal. Pulmonic valve  regurgitation is not visualized. No evidence of pulmonic stenosis.  Aorta: The aortic root and ascending aorta are structurally normal, with no evidence of dilitation.  Venous: The inferior vena cava is normal in size with greater than 50% respiratory variability, suggesting right atrial pressure of 3 mmHg.  IAS/Shunts: The atrial septum is grossly normal.   LEFT VENTRICLE PLAX 2D LVIDd:         3.60 cm   Diastology LVIDs:         2.10 cm   LV e' medial:    8.49 cm/s LV PW:         0.90 cm   LV E/e' medial:  11.6 LV IVS:        0.90 cm   LV e' lateral:   7.62 cm/s LVOT diam:     2.00 cm   LV E/e' lateral: 12.9 LV SV:         51 LV SV Index:   32        2D Longitudinal Strain LVOT Area:     3.14 cm  2D Strain GLS Avg:     -20.1 %   RIGHT VENTRICLE RV Basal diam:  2.80 cm RV S prime:     10.00 cm/s TAPSE (M-mode): 1.8 cm RVSP:           23.4 mmHg  LEFT ATRIUM             Index        RIGHT ATRIUM           Index LA diam:        3.70 cm 2.30 cm/m   RA Pressure: 3.00 mmHg LA Vol (A2C):   44.6 ml 27.73 ml/m  RA Area:     11.20 cm LA Vol (A4C):   40.8 ml 25.36 ml/m  RA Volume:   25.30 ml  15.73 ml/m LA Biplane Vol: 43.1 ml 26.79 ml/m AORTIC VALVE LVOT Vmax:   82.20 cm/s LVOT Vmean:  48.700 cm/s LVOT VTI:    0.162 m  AORTA Ao Root diam: 2.40 cm Ao Asc diam:  3.00 cm  MITRAL VALVE               TRICUSPID VALVE MV Area (PHT): 5.66 cm    TR Peak grad:   20.4 mmHg MV Decel Time: 134 msec    TR Vmax:        226.00 cm/s MV E velocity: 98.50 cm/s  Estimated RAP:  3.00 mmHg MV A velocity: 69.20 cm/s  RVSP:           23.4 mmHg MV E/A ratio:  1.42 SHUNTS Systemic VTI:  0.16 m Systemic Diam: 2.00 cm  Lennie Odor MD Electronically signed by Lennie Odor MD Signature Date/Time: 09/24/2022/4:02:54 PM    Final   TEE  ECHO TEE 08/12/2022  Narrative TRANSESOPHOGEAL ECHO REPORT    Patient Name:   Holly Mcconnell Date of Exam: 08/12/2022 Medical Rec #:   [id number]               Height:       59.5 in Accession #:    [id number]              Weight:       145.0 lb Date of Birth:  [birth year]               BSA:          1.618 m  Patient Age:    76 years                BP:           123/64 mmHg Patient Gender: F                       HR:           75 bpm. Exam Location:  Outpatient  Procedure: Transesophageal Echo and Color Doppler  Indications:     Pre ablation evaluation  History:         Patient has prior history of Echocardiogram examinations, most recent 03/20/2022. Risk Factors:Dyslipidemia.  Sonographer:     Delcie Roch RDCS Referring Phys:  [id number] Lanier Prude Diagnosing Phys: Arvilla Meres MD  PROCEDURE: After discussion of the risks and benefits of a TEE, an informed consent was obtained from the patient. The patient was intubated. The transesophogeal probe was passed without difficulty through the esophogus of the patient. Imaged were obtained with the patient in a supine position. Sedation performed by different physician. The patient was monitored while under deep sedation. Anesthestetic sedation was provided intravenously by Anesthesiology: 120mg  of Propofol, 60mg  of Lidocaine. The patient developed no complications during the procedure.  IMPRESSIONS   1. Left ventricular ejection fraction, by estimation, is 60 to 65%. The left ventricle has normal function. 2. Right ventricular systolic function is normal. The right ventricular size is normal. 3. Left atrial size was severely dilated. No left atrial/left atrial appendage thrombus was detected. 4. The mitral valve is normal in structure. Trivial mitral valve regurgitation. 5. The aortic valve is normal in structure. Aortic valve regurgitation is not visualized. 6. There is mild (Grade II) plaque involving the descending aorta.  FINDINGS Left Ventricle: Left ventricular ejection fraction, by estimation, is 60 to 65%. The left ventricle has normal function. The  left ventricular internal cavity size was normal in size.  Right Ventricle: The right ventricular size is normal. No increase in right ventricular wall thickness. Right ventricular systolic function is normal.  Left Atrium: Left atrial size was severely dilated. No left atrial/left atrial appendage thrombus was detected.  Right Atrium: Right atrial size was normal in size.  Pericardium: There is no evidence of pericardial effusion.  Mitral Valve: The mitral valve is normal in structure. Trivial mitral valve regurgitation.  Tricuspid Valve: The tricuspid valve is normal in structure. Tricuspid valve regurgitation is trivial.  Aortic Valve: The aortic valve is normal in structure. Aortic valve regurgitation is not visualized.  Pulmonic Valve: The pulmonic valve was normal in structure. Pulmonic valve regurgitation is not visualized.  Aorta: The aortic root is normal in size and structure. There is mild (Grade II) plaque involving the descending aorta.  IAS/Shunts: No atrial level shunt detected by color flow Doppler.  Arvilla Meres MD Electronically signed by Arvilla Meres MD Signature Date/Time: 08/15/2022/6:30:11 PM    Final   MONITORS  LONG TERM MONITOR (3-14 DAYS) 03/12/2022  Narrative   9% atrial flutter burden, average rate 134 bpm with longest episode lasting 1 hour with average rate 147 bpm   Occasional PVCs (3% of beats)   Patch Wear Time:  13 days and 23 hours (2023-12-20T16:49:17-0500 to 2024-01-03T16:49:13-0500)  Patient had a min HR of 49 bpm, max HR of 193 bpm, and avg HR of 82 bpm. Predominant underlying rhythm was Sinus Rhythm. Atrial Flutter occurred (9% burden), ranging from 61-193 bpm (avg of 134 bpm), the longest lasting 1  hour 2 mins with an avg rate of 147 bpm. Atrial Flutter was present at activation of device. Atrial Flutter was detected within +/- 45 seconds of symptomatic patient event(s). Isolated SVEs were rare (<1.0%), SVE Couplets were rare  (<1.0%), and no SVE Triplets were present. Isolated VEs were occasional (3.0%, 49159), and no VE Couplets or VE Triplets were present. Ventricular Bigeminy and Trigeminy were present.  Patient triggered events corresponded to atrial flutter  PVCs           EKG:  EKG is ordered today.  The ekg ordered today demonstrates NSR with HR 74bpm.   Recent Labs: 10/20/2022: BUN 15; Creatinine, Ser 0.74; Hemoglobin 12.4; Platelets 288; Potassium 4.3; Sodium 142   Recent Lipid Panel No results found for: "CHOL", "TRIG", "HDL", "CHOLHDL", "VLDL", "LDLCALC", "LDLDIRECT"  Risk Assessment/Calculations:    HAS-BLED score 2 Hypertension Yes  Abnormal renal and liver function (Dialysis, transplant, Cr >2.26 mg/dL /Cirrhosis or Bilirubin >2x Normal or AST/ALT/AP >3x Normal) No  Stroke No  Bleeding No  Labile INR (Unstable/high INR) No  Elderly (>65) Yes  Drugs or alcohol (? 8 drinks/week, anti-plt or NSAID) No    CHA2DS2-VASc Score = 4  The patient's score is based upon: CHF History: 0 HTN History: 1 Diabetes History: 0 Stroke History: 0 Vascular Disease History: 0 Age Score: 2 Gender Score: 1     Physical Exam:    VS:  BP 108/60   Pulse 74   Ht 4\' 11"  (1.499 m)   Wt 141 lb (64 kg)   BMI 28.48 kg/m     Wt Readings from Last 3 Encounters:  10/20/22 141 lb (64 kg)  09/11/22 143 lb (64.9 kg)  08/26/22 144 lb (65.3 kg)    General: Well developed, well nourished, NAD Skin: Warm, dry, intact  Head: Normocephalic, atraumatic, sclera non-icteric, no xanthomas, clear, moist mucus membranes. Neck: Negative for carotid bruits. No JVD Lungs:Clear to ausculation bilaterally. No wheezes, rales, or rhonchi. Breathing is unlabored. Cardiovascular: RRR with S1 S2. No murmurs Extremities: No edema. Neuro: Alert and oriented. No focal deficits. No facial asymmetry. MAE spontaneously. Psych: Responds to questions appropriately with normal affect.    ASSESSMENT/PLAN:    Paroxsymal atrial  fibrillation: s/p afib/flutter ablation 08/12/2022. Maintaining NSR. Plan for LAAO with Watchman scheduled 8/29. Pre ablation CT showed anatomy suitable for 24mm Watchman FLX Pro device. Instruction letter reviewed with all questions answered. CHG soap given. Obtain BMET, CBC today.    Medication Adjustments/Labs and Tests Ordered: Current medicines are reviewed at length with the patient today.  Concerns regarding medicines are outlined above.  Orders Placed This Encounter  Procedures   Basic metabolic panel   CBC   EKG 12-Lead   No orders of the defined types were placed in this encounter.   Patient Instructions  Medication Instructions:  Your physician recommends that you continue on your current medications as directed. Please refer to the Current Medication list given to you today.  *If you need a refill on your cardiac medications before your next appointment, please call your pharmacy*   Lab Work: CBC, BMET If you have labs (blood work) drawn today and your tests are completely normal, you will receive your results only by: MyChart Message (if you have MyChart) OR A paper copy in the mail If you have any lab test that is abnormal or we need to change your treatment, we will call you to review the results.   Follow-Up: At [facility], you and  your health needs are our priority.  As part of our continuing mission to provide you with exceptional heart care, we have created designated Provider Care Teams.  These Care Teams include your primary Cardiologist (physician) and Advanced Practice Providers (APPs -  Physician Assistants and Nurse Practitioners) who all work together to provide you with the care you need, when you need it.    Your next appointment:   As scheduled per structural team     Signed, Georgie Chard, NP  10/21/2022 10:48 AM    [facility]

## 2022-10-22 DIAGNOSIS — G809 Cerebral palsy, unspecified: Secondary | ICD-10-CM | POA: Diagnosis not present

## 2022-10-22 DIAGNOSIS — M4140 Neuromuscular scoliosis, site unspecified: Secondary | ICD-10-CM | POA: Diagnosis not present

## 2022-10-22 DIAGNOSIS — M5459 Other low back pain: Secondary | ICD-10-CM | POA: Diagnosis not present

## 2022-10-22 DIAGNOSIS — M546 Pain in thoracic spine: Secondary | ICD-10-CM | POA: Diagnosis not present

## 2022-10-23 DIAGNOSIS — E890 Postprocedural hypothyroidism: Secondary | ICD-10-CM | POA: Diagnosis not present

## 2022-10-23 DIAGNOSIS — I4891 Unspecified atrial fibrillation: Secondary | ICD-10-CM | POA: Diagnosis not present

## 2022-10-24 DIAGNOSIS — M1711 Unilateral primary osteoarthritis, right knee: Secondary | ICD-10-CM | POA: Diagnosis not present

## 2022-10-27 DIAGNOSIS — Z23 Encounter for immunization: Secondary | ICD-10-CM | POA: Diagnosis not present

## 2022-10-27 DIAGNOSIS — E039 Hypothyroidism, unspecified: Secondary | ICD-10-CM | POA: Diagnosis not present

## 2022-10-27 DIAGNOSIS — M5459 Other low back pain: Secondary | ICD-10-CM | POA: Diagnosis not present

## 2022-10-27 DIAGNOSIS — E663 Overweight: Secondary | ICD-10-CM | POA: Diagnosis not present

## 2022-10-27 DIAGNOSIS — E782 Mixed hyperlipidemia: Secondary | ICD-10-CM | POA: Diagnosis not present

## 2022-10-27 DIAGNOSIS — R2689 Other abnormalities of gait and mobility: Secondary | ICD-10-CM | POA: Diagnosis not present

## 2022-10-27 DIAGNOSIS — I4819 Other persistent atrial fibrillation: Secondary | ICD-10-CM | POA: Diagnosis not present

## 2022-10-27 DIAGNOSIS — G43909 Migraine, unspecified, not intractable, without status migrainosus: Secondary | ICD-10-CM | POA: Diagnosis not present

## 2022-10-27 DIAGNOSIS — M1711 Unilateral primary osteoarthritis, right knee: Secondary | ICD-10-CM | POA: Diagnosis not present

## 2022-10-27 DIAGNOSIS — Z6827 Body mass index (BMI) 27.0-27.9, adult: Secondary | ICD-10-CM | POA: Diagnosis not present

## 2022-10-27 DIAGNOSIS — M85852 Other specified disorders of bone density and structure, left thigh: Secondary | ICD-10-CM | POA: Diagnosis not present

## 2022-10-27 DIAGNOSIS — G809 Cerebral palsy, unspecified: Secondary | ICD-10-CM | POA: Diagnosis not present

## 2022-10-27 DIAGNOSIS — M546 Pain in thoracic spine: Secondary | ICD-10-CM | POA: Diagnosis not present

## 2022-10-27 DIAGNOSIS — M4140 Neuromuscular scoliosis, site unspecified: Secondary | ICD-10-CM | POA: Diagnosis not present

## 2022-10-28 ENCOUNTER — Telehealth: Payer: Self-pay

## 2022-10-28 NOTE — Telephone Encounter (Signed)
Confirmed procedure date of 10/30/2022. Confirmed arrival time of 0530 for procedure time at 0730. Reviewed pre-procedure instructions with patient. The patient understands to call if questions/concerns arise prior to procedure.

## 2022-10-29 DIAGNOSIS — M5459 Other low back pain: Secondary | ICD-10-CM | POA: Diagnosis not present

## 2022-10-29 DIAGNOSIS — M546 Pain in thoracic spine: Secondary | ICD-10-CM | POA: Diagnosis not present

## 2022-10-29 DIAGNOSIS — M4140 Neuromuscular scoliosis, site unspecified: Secondary | ICD-10-CM | POA: Diagnosis not present

## 2022-10-29 DIAGNOSIS — G809 Cerebral palsy, unspecified: Secondary | ICD-10-CM | POA: Diagnosis not present

## 2022-10-30 ENCOUNTER — Encounter (HOSPITAL_COMMUNITY): Payer: Self-pay | Admitting: Cardiology

## 2022-10-30 ENCOUNTER — Inpatient Hospital Stay (HOSPITAL_COMMUNITY): Payer: PPO | Admitting: Certified Registered Nurse Anesthetist

## 2022-10-30 ENCOUNTER — Inpatient Hospital Stay (HOSPITAL_COMMUNITY): Payer: PPO

## 2022-10-30 ENCOUNTER — Other Ambulatory Visit: Payer: Self-pay

## 2022-10-30 ENCOUNTER — Encounter (HOSPITAL_COMMUNITY)
Admission: RE | Disposition: A | Discharge status: Home / Self Care | Payer: Self-pay | Source: Home / Self Care | Attending: Cardiology

## 2022-10-30 ENCOUNTER — Inpatient Hospital Stay (HOSPITAL_COMMUNITY)
Admission: RE | Admit: 2022-10-30 | Discharge: 2022-10-30 | DRG: 274 | Disposition: A | Discharge status: Home / Self Care | Payer: PPO | Attending: Cardiology | Admitting: Cardiology

## 2022-10-30 DIAGNOSIS — G809 Cerebral palsy, unspecified: Secondary | ICD-10-CM | POA: Diagnosis present

## 2022-10-30 DIAGNOSIS — Z7901 Long term (current) use of anticoagulants: Secondary | ICD-10-CM | POA: Diagnosis not present

## 2022-10-30 DIAGNOSIS — I4891 Unspecified atrial fibrillation: Secondary | ICD-10-CM | POA: Diagnosis not present

## 2022-10-30 DIAGNOSIS — I10 Essential (primary) hypertension: Secondary | ICD-10-CM | POA: Diagnosis not present

## 2022-10-30 DIAGNOSIS — Z95818 Presence of other cardiac implants and grafts: Secondary | ICD-10-CM

## 2022-10-30 DIAGNOSIS — I4892 Unspecified atrial flutter: Secondary | ICD-10-CM | POA: Diagnosis present

## 2022-10-30 DIAGNOSIS — Z006 Encounter for examination for normal comparison and control in clinical research program: Secondary | ICD-10-CM

## 2022-10-30 DIAGNOSIS — Z01818 Encounter for other preprocedural examination: Secondary | ICD-10-CM | POA: Diagnosis not present

## 2022-10-30 DIAGNOSIS — R296 Repeated falls: Secondary | ICD-10-CM | POA: Diagnosis present

## 2022-10-30 DIAGNOSIS — I480 Paroxysmal atrial fibrillation: Secondary | ICD-10-CM

## 2022-10-30 HISTORY — DX: Presence of other cardiac implants and grafts: Z95.818

## 2022-10-30 HISTORY — PX: LEFT ATRIAL APPENDAGE OCCLUSION: EP1229

## 2022-10-30 HISTORY — PX: TEE WITHOUT CARDIOVERSION: SHX5443

## 2022-10-30 LAB — POCT ACTIVATED CLOTTING TIME: Activated Clotting Time: 305 seconds

## 2022-10-30 LAB — TYPE AND SCREEN
ABO/RH(D): A POS
Antibody Screen: NEGATIVE

## 2022-10-30 LAB — ABO/RH: ABO/RH(D): A POS

## 2022-10-30 LAB — ECHO TEE

## 2022-10-30 SURGERY — LEFT ATRIAL APPENDAGE OCCLUSION
Anesthesia: General

## 2022-10-30 MED ORDER — ACETAMINOPHEN 325 MG PO TABS: 650.0000 mg | ORAL_TABLET | ORAL | Status: DC | PRN

## 2022-10-30 MED ORDER — HEPARIN (PORCINE) IN NACL 2000-0.9 UNIT/L-% IV SOLN
INTRAVENOUS | Status: DC | PRN
  Administered 2022-10-30: 1000 mL

## 2022-10-30 MED ORDER — CHLORHEXIDINE GLUCONATE 0.12 % MT SOLN
OROMUCOSAL | Status: AC
  Administered 2022-10-30: 15 mL
  Filled 2022-10-30: qty 15

## 2022-10-30 MED ORDER — HEPARIN SODIUM (PORCINE) 1000 UNIT/ML IJ SOLN
INTRAMUSCULAR | Status: DC | PRN
Start: 2022-10-30 — End: 2022-10-30
  Administered 2022-10-30: 2000 [IU] via INTRAVENOUS
  Administered 2022-10-30: 9000 [IU] via INTRAVENOUS

## 2022-10-30 MED ORDER — SODIUM CHLORIDE 0.9% FLUSH: 3.0000 mL | Freq: Two times a day (BID) | INTRAVENOUS | Status: DC

## 2022-10-30 MED ORDER — PROPOFOL 10 MG/ML IV BOLUS
INTRAVENOUS | Status: DC | PRN
  Administered 2022-10-30: 30 mg via INTRAVENOUS
  Administered 2022-10-30: 110 mg via INTRAVENOUS

## 2022-10-30 MED ORDER — ONDANSETRON HCL 4 MG/2ML IJ SOLN
INTRAMUSCULAR | Status: DC | PRN
  Administered 2022-10-30: 4 mg via INTRAVENOUS

## 2022-10-30 MED ORDER — SODIUM CHLORIDE 0.9 % IV SOLN: 250.0000 mL | INTRAVENOUS | Status: DC | PRN

## 2022-10-30 MED ORDER — LIDOCAINE 2% (20 MG/ML) 5 ML SYRINGE
INTRAMUSCULAR | Status: DC | PRN
  Administered 2022-10-30: 60 mg via INTRAVENOUS

## 2022-10-30 MED ORDER — ONDANSETRON HCL 4 MG/2ML IJ SOLN: 4.0000 mg | Freq: Four times a day (QID) | INTRAMUSCULAR | Status: DC | PRN

## 2022-10-30 MED ORDER — IOHEXOL 350 MG/ML SOLN
INTRAVENOUS | Status: DC | PRN
  Administered 2022-10-30 (×2): 5 mL

## 2022-10-30 MED ORDER — SODIUM CHLORIDE 0.9 % IV SOLN: INTRAVENOUS | Status: DC

## 2022-10-30 MED ORDER — ESMOLOL HCL 100 MG/10ML IV SOLN
INTRAVENOUS | Status: DC | PRN
Start: 2022-10-30 — End: 2022-10-30
  Administered 2022-10-30: 30 mg via INTRAVENOUS

## 2022-10-30 MED ORDER — SODIUM CHLORIDE 0.9% FLUSH: 3.0000 mL | INTRAVENOUS | Status: DC | PRN

## 2022-10-30 MED ORDER — HEPARIN SODIUM (PORCINE) 1000 UNIT/ML IJ SOLN
INTRAMUSCULAR | Status: AC
  Filled 2022-10-30: qty 10

## 2022-10-30 MED ORDER — VANCOMYCIN HCL IN DEXTROSE 1-5 GM/200ML-% IV SOLN
1000.0000 mg | INTRAVENOUS | Status: AC
  Administered 2022-10-30: 1000 mg via INTRAVENOUS
  Filled 2022-10-30: qty 200

## 2022-10-30 MED ORDER — SUGAMMADEX SODIUM 200 MG/2ML IV SOLN
INTRAVENOUS | Status: DC | PRN
  Administered 2022-10-30: 128.8 mg via INTRAVENOUS

## 2022-10-30 MED ORDER — PROTAMINE SULFATE 10 MG/ML IV SOLN
INTRAVENOUS | Status: AC
  Filled 2022-10-30: qty 5

## 2022-10-30 MED ORDER — PROTAMINE SULFATE 10 MG/ML IV SOLN
INTRAVENOUS | Status: DC | PRN
  Administered 2022-10-30: 30 mg via INTRAVENOUS

## 2022-10-30 MED ORDER — ROCURONIUM BROMIDE 10 MG/ML (PF) SYRINGE
PREFILLED_SYRINGE | INTRAVENOUS | Status: DC | PRN
  Administered 2022-10-30: 70 mg via INTRAVENOUS

## 2022-10-30 MED ORDER — PROPOFOL 500 MG/50ML IV EMUL
INTRAVENOUS | Status: DC | PRN
Start: 2022-10-30 — End: 2022-10-30
  Administered 2022-10-30: 100 ug/kg/min via INTRAVENOUS

## 2022-10-30 SURGICAL SUPPLY — 21 items
BAG SNAP BAND KOVER 36X36 (MISCELLANEOUS) IMPLANT
BLANKET WARM UNDERBOD FULL ACC (MISCELLANEOUS) ×1 IMPLANT
CATH DIAG 6FR PIGTAIL ANGLED (CATHETERS) IMPLANT
CLOSURE PERCLOSE PROSTYLE (VASCULAR PRODUCTS) IMPLANT
DEVICE WATCHMAN FLX PRO PROC (KITS) IMPLANT
DEVICE WATCHMAN FXD CRV PROC (KITS) IMPLANT
DILATOR VESSEL 38 20CM 16FR (INTRODUCER) IMPLANT
KIT HEART LEFT (KITS) ×1 IMPLANT
KIT SHEA VERSACROSS LAAC CONNE (KITS) IMPLANT
PACK CARDIAC CATHETERIZATION (CUSTOM PROCEDURE TRAY) ×1 IMPLANT
PAD DEFIB RADIO PHYSIO CONN (PAD) ×1 IMPLANT
SHEATH PERFORMER 16FR 30 (SHEATH) IMPLANT
SHEATH PINNACLE 8F 10CM (SHEATH) IMPLANT
SHEATH PROBE COVER 6X72 (BAG) ×1 IMPLANT
SYS WATCHMAN FXD DBL (SHEATH) ×1
SYSTEM WATCHMAN FXD DBL (SHEATH) IMPLANT
TRANSDUCER W/STOPCOCK (MISCELLANEOUS) ×1 IMPLANT
TUBING CIL FLEX 10 FLL-RA (TUBING) ×1 IMPLANT
WATCHMAN FLX PRO 27 (Prosthesis & Implant Heart) IMPLANT
WATCHMAN FLX PRO PROCEDURE (KITS) ×1 IMPLANT
WATCHMAN FXD CRV SYS PROCEDURE (KITS) ×1 IMPLANT

## 2022-10-30 NOTE — Anesthesia Procedure Notes (Addendum)
Procedure Name: Intubation Date/Time: 10/30/2022 7:53 AM  Performed by: Cy Blamer, CRNAPre-anesthesia Checklist: Patient identified, Emergency Drugs available, Suction available and Patient being monitored Patient Re-evaluated:Patient Re-evaluated prior to induction Oxygen Delivery Method: Circle system utilized Preoxygenation: Pre-oxygenation with 100% oxygen Induction Type: IV induction Ventilation: Mask ventilation without difficulty Laryngoscope Size: Miller and 2 Grade View: Grade I Tube type: Oral Tube size: 7.0 mm Number of attempts: 1 Airway Equipment and Method: Stylet Placement Confirmation: ETT inserted through vocal cords under direct vision, positive ETCO2 and breath sounds checked- equal and bilateral Secured at: 21 cm Tube secured with: Tape Dental Injury: Teeth and Oropharynx as per pre-operative assessment

## 2022-10-30 NOTE — H&P (Signed)
Electrophysiology Office Note:     Date:  10/30/2022    ID:  Holly Mcconnell, DOB 02/10/1946, MRN [id number]   PCP:  Gweneth Dimitri, MD            [facility] Cardiologist:  Bryan Lemma, MD  [facility] Electrophysiologist:  Lanier Prude, MD    Referring MD: Gweneth Dimitri, MD    Chief Complaint: Atrial flutter   History of Present Illness:     Holly Mcconnell is a 77 y.o. female who presents for an evaluation of atrial flutter at the request of Dr. Bjorn Pippin. Their medical history includes hypertension, cerebral palsy, atrial flutter.  The patient last saw Dr. Bjorn Pippin March 18, 2022.  She had recently worn a ZIO monitor for 14 days which showed a 9% atrial flutter burden with an average rate of 134 bpm. At the last appointment with Dr. Bjorn Pippin, Eliquis was started.  The patient prefers to avoid long-term exposure to anticoagulation given her history of cerebral palsy and history of falls.   Today, she is accompanied by her husband. She has been following up with Dr. Bjorn Pippin for atrial fibrillation and flutter.    She has had 2 falls within the last year. When she falls she tends to land on her knees.    She reported that her mother had a pacemaker.   She denies any palpitations, chest pain, shortness of breath, or peripheral edema. No lightheadedness, headaches, syncope, orthopnea, or PND.   Today she presents for Watchman evaluation.  Past medical, surgical, family and social history reviewed.   ROS:   Please see the history of present illness.    (+) Falls All other systems reviewed and are negative.   EKGs/Labs/Other Studies Reviewed:     The following studies were reviewed today:     Physical Exam:     VS:  BP 103/61   Pulse 67   Ht 4' 11.5" (1.511 m)   Wt 145 lb (65.8 kg)   SpO2 98%   BMI 28.80 kg/m         Wt Readings from Last 3 Encounters:  04/15/22 145 lb (65.8 kg)  03/18/22 149 lb 12.8 oz (67.9 kg)  02/17/22 149 lb  3.2 oz (67.7 kg)      GEN: Well nourished, well developed in no acute distress CARDIAC: RRR, no murmurs, rubs, gallops       Assessment ASSESSMENT:     1. Atrial flutter, unspecified type (HCC)     PLAN:     In order of problems listed above:     #Atrial fibrillation and flutter She is doing well after her ablation. On her watch, burden is now < 2%. She presents today for Watchman implant.  --------------------   I have seen Holly Mcconnell in the office today who is being considered for a Watchman left atrial appendage closure device. I believe they will benefit from this procedure given their history of atrial fibrillation, CHA2DS2-VASc score of 4 and unadjusted ischemic stroke rate of 4.8% per year. Unfortunately, the patient is not felt to be a long term anticoagulation candidate secondary to history of CP c/b falls. The patient's chart has been reviewed and I feel that they would be a candidate for short term oral anticoagulation after Watchman implant.    It is my belief that after undergoing a LAA closure procedure, [name] will not need long term anticoagulation which eliminates anticoagulation side effects and major bleeding risk.  Procedural risks for the Watchman implant have been reviewed with the patient including a 0.5% risk of stroke, <1% risk of perforation and <1% risk of device embolization. Other risks include bleeding, vascular damage, tamponade, worsening renal function, and death. The patient understands these risk and wishes to proceed.       The published clinical data on the safety and effectiveness of WATCHMAN include but are not limited to the following: - Holmes DR, Everlene Farrier, Sick P et al. for the PROTECT AF Investigators. Percutaneous closure of the left atrial appendage versus warfarin therapy for prevention of stroke in patients with atrial fibrillation: a randomised non-inferiority trial. Lancet 2009; 374: 534-42. Everlene Farrier,  Doshi SK, Isa Rankin D et al. on behalf of the PROTECT AF Investigators. Percutaneous Left Atrial Appendage Closure for Stroke Prophylaxis in Patients With Atrial Fibrillation 2.3-Year Follow-up of the PROTECT AF (Watchman Left Atrial Appendage System for Embolic Protection in Patients With Atrial Fibrillation) Trial. Circulation 2013; 127:720-729. - Alli O, Doshi S,  Kar S, Reddy VY, Sievert H et al. Quality of Life Assessment in the Randomized PROTECT AF (Percutaneous Closure of the Left Atrial Appendage Versus Warfarin Therapy for Prevention of Stroke in Patients With Atrial Fibrillation) Trial of Patients at Risk for Stroke With Nonvalvular Atrial Fibrillation. J Am Coll Cardiol 2013; 61:1790-8. Aline August DR, Mia [place], Price M, Whisenant B, Sievert H, Doshi S, Huber K, Reddy V. Prospective randomized evaluation of the Watchman left atrial appendage Device in patients with atrial fibrillation versus long-term warfarin therapy; the PREVAIL trial. Journal of the [employer] of Cardiology, Vol. 4, No. 1, 2014, 1-11. - Kar S, Doshi SK, Sadhu A, Horton R, Osorio J et al. Primary outcome evaluation of a next-generation left atrial appendage closure device: results from the PINNACLE FLX trial. Circulation 2021;143(18)1754-1762.      After today's visit with the patient which was dedicated solely for shared decision making visit regarding LAA closure device, the patient decided to proceed with the LAA appendage closure procedure scheduled to be done in the near future at [facility].     HAS-BLED score 2 Hypertension Yes  Abnormal renal and liver function (Dialysis, transplant, Cr >2.26 mg/dL /Cirrhosis or Bilirubin >2x Normal or AST/ALT/AP >3x Normal) No  Stroke No  Bleeding No  Labile INR (Unstable/high INR) No  Elderly (>65) Yes  Drugs or alcohol (? 8 drinks/week, anti-plt or NSAID) No    CHA2DS2-VASc Score = 4  The patient's score is based upon: CHF History: 0 HTN History:  1 Diabetes History: 0 Stroke History: 0 Vascular Disease History: 0 Age Score: 2 Gender Score: 1    \    Presents for Watchman evaluation today. Procedure reviewed.   Signed, Rossie Muskrat. Lalla Brothers, MD, [facility], [facility] 10/30/2022 Electrophysiology [facility]

## 2022-10-30 NOTE — Discharge Summary (Signed)
HE[facility] VASCULAR CENTER    Patient ID: Holly Mcconnell,  MRN: [id number], DOB/AGE: [birth year] 77 y.o.  Admit date: 10/30/2022 Discharge date: 10/30/2022  Primary Care Physician: Holly Dimitri, MD  Primary Cardiologist: Little Ishikawa, MD  Electrophysiologist: Lanier Prude, MD  Primary Discharge Diagnosis:  Paroxysmal Atrial Fibrillation Poor candidacy for long term anticoagulation due to  increased risk of falls  Procedures This Admission:  Transeptal Puncture Intra-procedural TEE which showed no LAA thrombus Left atrial appendage occlusive device placement on 10/30/22 by Dr. Lalla Brothers.   This study demonstrated:  CONCLUSIONS:  1.Successful implantation of a WATCHMAN left atrial appendage occlusive device    2. TEE demonstrating no LAA thrombus 3. No early apparent complications.   Post Implant Anticoagulation Strategy: Continue Eliquis 5mg  by mouth twice daily for 45 days. After 45 days, stop Eliquis and start Plavix 75mg  by mouth once daily to complete 6 months of post implant therapy. Plan for CT scan after 60 days to assess Watchman position and left atrial appendage seal.   Brief HPI: Holly Mcconnell is a 77 y.o. female with a history of cerebral palsy, HTN, atrial flutter and atrial fibrillation who was referred to Dr. Lalla Brothers for LAAO evaluation.    Ms. Gorra wore a ZIO monitor 03/2022 for 14 days which showed a 9% atrial flutter burden with an average rate of 134 bpm. She was seen by Dr Lalla Brothers and underwent afib and flutter ablation on 08/12/22. Given her hx of chronic falls plan was to proceed with Watchman after ablation to avoid long term anticoagulation.  In ablation follow up, she was doing well with no concerns and wished to pursue LAAO closure which was set up for 10/30/22.   Hospital Course:  The patient was admitted and underwent left atrial appendage occlusive device placement with 27mm Watchman FLX Pro device. She was monitored  in the post procedure setting and has done very well with no concerns. Given this, she is being considered for same day discharge later today. Groin site has been stable without evidence of hematoma or bleeding. Wound care and restrictions were reviewed with the patient. The patient has been scheduled for post procedure follow up with Dr. Lalla Brothers 12/03/2022. She will restart Eliquis this evening and continue for 45 days (12/14/22) at which time she will stop and start Plavix 75mg  to complete 6 months of therapy (2/29/25). A repeat CT will be performed in approximately 60 days to ensure proper seal of the device.   Physical Exam: Vitals:   10/30/22 1000 10/30/22 1015 10/30/22 1030 10/30/22 1114  BP: 118/65 117/62 (!) 116/54 (!) 121/59  Pulse: 72 68 83 87  Resp: (!) 21 17 (!) 22 17  Temp:    98 F (36.7 C)  TempSrc:    Oral  SpO2: 95% 96% 95% 97%  Weight:      Height:       Labs:   Lab Results  Component Value Date   WBC 6.2 10/20/2022   HGB 12.4 10/20/2022   HCT 38.4 10/20/2022   MCV 93 10/20/2022   PLT 288 10/20/2022   No results for input(s): "NA", "K", "CL", "CO2", "BUN", "CREATININE", "CALCIUM", "PROT", "BILITOT", "ALKPHOS", "ALT", "AST", "GLUCOSE" in the last 168 hours.  Invalid input(s): "LABALBU"   Discharge Medications:  Allergies as of 10/30/2022       Reactions   Clavulanic Acid Other (See Comments)   Nitrofurantoin Other (See Comments), Diarrhea, Nausea Only   Other Reaction(s): nausea  and diarrhea   Amoxicillin Diarrhea   Nabumetone Diarrhea, Nausea And Vomiting   Other Other (See Comments)   Difficulty waking from anesthesia    Sulfa Antibiotics Diarrhea, Nausea And Vomiting   Codeine Diarrhea        Medication List     TAKE these medications    atorvastatin 20 MG tablet Commonly known as: LIPITOR Take 20 mg by mouth at bedtime.   CALCIUM 1200 PO Take 1,200 mg by mouth every evening.   Eliquis 5 MG Tabs tablet Generic drug: apixaban TAKE 1 TABLET  BY MOUTH TWICE A DAY Notes to patient: Restart tonight, 10/30/22   ibandronate 150 MG tablet Commonly known as: BONIVA Take 150 mg by mouth every 30 (thirty) days.   levothyroxine 75 MCG tablet Commonly known as: SYNTHROID Take 37.5-75 mcg by mouth See admin instructions. Take 0.5 tablet (37.5 mcg) by mouth in the morning on Sundays.  Take 1 tablet (75 mcg) by mouth once daily in the morning on all other days.   MAGNESIUM PO Take 250 mg by mouth at bedtime.   metoprolol succinate 25 MG 24 hr tablet Commonly known as: TOPROL-XL Take 1 tablet (25 mg total) by mouth daily.   multivitamin with minerals Tabs tablet Take 1 tablet by mouth every evening.   SUMAtriptan 100 MG tablet Commonly known as: IMITREX Take 100 mg by mouth every 2 (two) hours as needed for migraine. May repeat in 2 hours if headache persists or recurs.   Systane 0.4-0.3 % Soln Generic drug: Polyethyl Glycol-Propyl Glycol Place 1 drop into both eyes in the morning and at bedtime.   Systane 0.4-0.3 % Gel ophthalmic gel Generic drug: Polyethyl Glycol-Propyl Glycol Place 1 Application into both eyes at bedtime.   topiramate 25 MG tablet Commonly known as: TOPAMAX Take 25 mg by mouth at bedtime.        Disposition:  Home  Discharge Instructions     Diet - low sodium heart healthy   Complete by: As directed    Discharge instructions   Complete by: As directed    [facility] Procedure, Care After  Procedure MD: Dr. Isidoro Donning Clinical Coordinator: Karsten Fells, RN  This sheet gives you information about how to care for yourself after your procedure. Your health care provider may also give you more specific instructions. If you have problems or questions, contact your health care provider.  What can I expect after the procedure? After the procedure, it is common to have: Bruising around your puncture site. Tenderness around your puncture site. Tiredness (fatigue).  Medication instructions It is  very important to continue to take your blood thinner as directed by your doctor after the Watchman procedure. Call your procedure doctor's office with question or concerns. If you are on Coumadin (warfarin), you will have your INR checked the week after your procedure, with a goal INR of 2.0 - 3.0. Please follow your medication instructions on your discharge summary. Only take the medications listed on your discharge paperwork.  Follow up You will be seen in 1 month after your procedure You will have a repeat CT scan approximately 8 weeks after your procedure mark to check your device You will follow up the MD/APP who performed your procedure 6 months after your procedure The Watchman Clinical Coordinator will check in with you from time to time, including 1 and 2 years after your procedure.    Follow these instructions at home: Puncture site care  Follow instructions from your health care  provider about how to take care of your puncture site. Make sure you: If present, leave stitches (sutures), skin glue, or adhesive strips in place.  If a large square bandage is present, this may be removed 24 hours after surgery.  Check your puncture site every day for signs of infection. Check for: Redness, swelling, or pain. Fluid or blood. If your puncture site starts to bleed, lie down on your back, apply firm pressure to the area, and contact your health care provider. Warmth. Pus or a bad smell. Driving Do not drive yourself home if you received sedation Do not drive for at least 4 days after your procedure or however long your health care provider recommends. (Do not resume driving if you have previously been instructed not to drive for other health reasons.) Do not spend greater than 1 hour at a time in a car for the first 3 days. Stop and take a break with a 5 minute walk at least every hour.  Do not drive or use heavy machinery while taking prescription pain medicine.  Activity Avoid  activities that take a lot of effort, including exercise, for at least 7 days after your procedure. For the first 3 days, avoid sitting for longer than one hour at a time.  Avoid alcoholic beverages, signing paperwork, or participating in legal proceedings for 24 hours after receiving sedation Do not lift anything that is heavier than 10 lb (4.5 kg) for one week.  No sexual activity for 1 week.  Return to your normal activities as told by your health care provider. Ask your health care provider what activities are safe for you. General instructions Take over-the-counter and prescription medicines only as told by your health care provider. Do not use any products that contain nicotine or tobacco, such as cigarettes and e-cigarettes. If you need help quitting, ask your health care provider. You may shower after 24 hours, but Do not take baths, swim, or use a hot tub for 1 week.  Do not drink alcohol for 24 hours after your procedure. Keep all follow-up visits as told by your health care provider. This is important. Dental Work: You will require antibiotics prior to any dental work, including cleanings, for 6 months after your Watchman implantation to help protect you from infection. After 6 months, antibiotics are no longer required. Contact a health care provider if: You have redness, mild swelling, or pain around your puncture site. You have soreness in your throat or at your puncture site that does not improve after several days You have fluid or blood coming from your puncture site that stops after applying firm pressure to the area. Your puncture site feels warm to the touch. You have pus or a bad smell coming from your puncture site. You have a fever. You have chest pain or discomfort that spreads to your neck, jaw, or arm. You are sweating a lot. You feel nauseous. You have a fast or irregular heartbeat. You have shortness of breath. You are dizzy or light-headed and feel the need to  lie down. You have pain or numbness in the arm or leg closest to your puncture site. Get help right away if: Your puncture site suddenly swells. Your puncture site is bleeding and the bleeding does not stop after applying firm pressure to the area. These symptoms may represent a serious problem that is an emergency. Do not wait to see if the symptoms will go away. Get medical help right away. Call your local emergency services (  911 in the [place] Do not drive yourself to the hospital. Summary After the procedure, it is normal to have bruising and tenderness at the puncture site in your groin, neck, or forearm. Check your puncture site every day for signs of infection. Get help right away if your puncture site is bleeding and the bleeding does not stop after applying firm pressure to the area. This is a medical emergency.  This information is not intended to replace advice given to you by your health care provider. Make sure you discuss any questions you have with your health care provider.   Increase activity slowly   Complete by: As directed        Follow-up Information     Filbert Schilder, NP Follow up on 12/03/2022.   Specialty: Cardiology Why: @ 9:55am. Please arrive at 9:40am Contact information: [address] [place] [address] [phone]                 Duration of Discharge Encounter: Greater than 30 minutes including physician time.  Signed, Georgie Chard, NP  10/30/2022 1:55 PM

## 2022-10-30 NOTE — Discharge Instructions (Signed)

## 2022-10-30 NOTE — Anesthesia Preprocedure Evaluation (Addendum)
Anesthesia Evaluation  Patient identified by MRN, date of birth, ID band Patient awake    Reviewed: Allergy & Precautions, NPO status , Patient's Chart, lab work & pertinent test results  History of Anesthesia Complications Negative for: history of anesthetic complications  Airway Mallampati: II  TM Distance: >3 FB Neck ROM: Full  Mouth opening: Limited Mouth Opening  Dental  (+) Dental Advisory Given, Teeth Intact   Pulmonary neg shortness of breath, neg COPD, neg recent URI   breath sounds clear to auscultation       Cardiovascular (-) angina (-) Past MI + dysrhythmias Atrial Fibrillation  Rhythm:Regular  Left ventricular ejection fraction, by estimation, is 55 to 60%. The  left ventricle has normal function. The left ventricle has no regional  wall motion abnormalities. Left ventricular diastolic parameters were  normal. The average left ventricular  global longitudinal strain is -20.1 %. The global longitudinal strain is  normal.   2. Right ventricular systolic function is normal. The right ventricular  size is normal. There is normal pulmonary artery systolic pressure. The  estimated right ventricular systolic pressure is 23.4 mmHg.   3. The mitral valve is grossly normal. Mild mitral valve regurgitation.  No evidence of mitral stenosis.   4. The aortic valve is tricuspid. Aortic valve regurgitation is not  visualized. No aortic stenosis is present.   5. The inferior vena cava is normal in size with greater than 50%  respiratory variability, suggesting right atrial pressure of 3 mmHg.     Neuro/Psych  Headaches  hx of cerebral palsy  Neuromuscular disease  negative psych ROS   GI/Hepatic negative GI ROS, Neg liver ROS,,,  Endo/Other  Hypothyroidism    Renal/GU negative Renal ROSLab Results      Component                Value               Date                      NA                       142                  10/20/2022                K                        4.3                 10/20/2022                CO2                      20                  10/20/2022                GLUCOSE                  91                  10/20/2022                BUN                      15  10/20/2022                CREATININE               0.74                10/20/2022                CALCIUM                  9.5                 10/20/2022                EGFR                     84                  10/20/2022                Musculoskeletal  (+) Arthritis ,    Abdominal   Peds  Hematology  (+) Blood dyscrasia Lab Results      Component                Value               Date                      WBC                      6.2                 10/20/2022                HGB                      12.4                10/20/2022                HCT                      38.4                10/20/2022                MCV                      93                  10/20/2022                PLT                      288                 10/20/2022            eliquis   Anesthesia 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  Reproductive/Obstetrics                             Anesthesia Physical Anesthesia Plan  ASA: 2  Anesthesia Plan: General   Post-op Pain Management: Minimal or no pain anticipated   Induction: Intravenous  PONV Risk Score and Plan: 3 and Propofol infusion, TIVA, Ondansetron and Dexamethasone  Airway Management Planned: Oral  ETT  Additional Equipment: ClearSight  Intra-op Plan:   Post-operative Plan: Extubation in OR  Informed Consent: I have reviewed the patients History and Physical, chart, labs and discussed the procedure including the risks, benefits and alternatives for the proposed anesthesia with the patient or authorized representative who has indicated his/her understanding and acceptance.     Dental advisory given  Plan Discussed with:  CRNA  Anesthesia Plan Comments:         Anesthesia Quick Evaluation

## 2022-10-30 NOTE — Transfer of Care (Signed)
Immediate Anesthesia Transfer of Care Note  Patient: Holly Mcconnell  Procedure(s) Performed: LEFT ATRIAL APPENDAGE OCCLUSION TRANSESOPHAGEAL ECHOCARDIOGRAM  Patient Location: Cath Lab  Anesthesia Type:General  Level of Consciousness: awake, patient cooperative, and responds to stimulation  Airway & Oxygen Therapy: Patient Spontanous Breathing and Patient connected to nasal cannula oxygen  Post-op Assessment: Report given to RN and Post -op Vital signs reviewed and stable  Post vital signs: Reviewed and stable  Last Vitals:  Vitals Value Taken Time  BP 114/58 0914  Temp    Pulse 79 0914  Resp 23 0914  SpO2 93 0914    Last Pain:  Vitals:   10/30/22 0606  TempSrc: Oral  PainSc: 0-No pain         Complications: No notable events documented.

## 2022-10-30 NOTE — TOC Progression Note (Signed)
Transition of Care [facility]) - Progression Note    Patient Details  Name: Holly Mcconnell MRN: [id number] Date of Birth: [birth year]  [facility] CM/SW Contact  Leone Haven, RN Phone Number: 10/30/2022, 2:41 PM  Clinical Narrative:    NCM on unit, went to room, Nurse said they just took patient out, NCM did not get to see patient prior to dc.           Expected Discharge Plan and Services         Expected Discharge Date: 10/30/22                                     Social Determinants of Health (SDOH) Interventions SDOH Screenings   Food Insecurity: No Food Insecurity (12/07/2020)   Received from [facility], [facility]  Transportation Needs: No Transportation Needs (12/07/2020)   Received from [facility], [facility]  Financial Resource Strain: Low Risk  (12/07/2020)   Received from [facility], [facility]  Physical Activity: Sufficiently Active (12/07/2020)   Received from [facility], [facility]  Social Connections: Unknown (07/16/2021)   Received from [facility], [facility]  Stress: No Stress Concern Present (12/07/2020)   Received from [facility], [facility]  Tobacco Use: Low Risk  (10/30/2022)    Readmission Risk Interventions     No data to display

## 2022-10-30 NOTE — Progress Notes (Signed)
Went over discharge paperwork with patient and husband. All questions answered. PIV removed. Belongings at beside.

## 2022-10-31 ENCOUNTER — Encounter (HOSPITAL_COMMUNITY): Payer: Self-pay | Admitting: Cardiology

## 2022-10-31 ENCOUNTER — Telehealth: Payer: Self-pay | Admitting: Cardiology

## 2022-10-31 NOTE — Telephone Encounter (Signed)
  HEART AND VASCULAR CENTER   Watchman Team  Contacted the patient regarding discharge from [facility] on 10/30/22  The patient understands to follow up with Georgie Chard NP on 10/2  The patient understands discharge instructions? Yes  The patient understands medications and regimen? Yes   The patient reports groin site looks stable with no bleeding.  The patient understands to call with any questions or concerns prior to scheduled visit.

## 2022-10-31 NOTE — Anesthesia Postprocedure Evaluation (Signed)
Anesthesia Post Note  Patient: Holly Mcconnell  Procedure(s) Performed: LEFT ATRIAL APPENDAGE OCCLUSION TRANSESOPHAGEAL ECHOCARDIOGRAM     Patient location during evaluation: Cath Lab Anesthesia Type: General Level of consciousness: awake and alert Pain management: pain level controlled Vital Signs Assessment: post-procedure vital signs reviewed and stable Respiratory status: spontaneous breathing, nonlabored ventilation, respiratory function stable and patient connected to nasal cannula oxygen Cardiovascular status: blood pressure returned to baseline and stable Postop Assessment: no apparent nausea or vomiting Anesthetic complications: no   No notable events documented.  Last Vitals:  Vitals:   10/30/22 1030 10/30/22 1114  BP: (!) 116/54 (!) 121/59  Pulse: 83 87  Resp: (!) 22 17  Temp:  36.7 C  SpO2: 95% 97%    Last Pain:  Vitals:   10/30/22 1114  TempSrc: Oral  PainSc:                  Destini Cambre

## 2022-11-04 DIAGNOSIS — L814 Other melanin hyperpigmentation: Secondary | ICD-10-CM | POA: Diagnosis not present

## 2022-11-04 DIAGNOSIS — D1801 Hemangioma of skin and subcutaneous tissue: Secondary | ICD-10-CM | POA: Diagnosis not present

## 2022-11-04 DIAGNOSIS — D229 Melanocytic nevi, unspecified: Secondary | ICD-10-CM | POA: Diagnosis not present

## 2022-11-04 DIAGNOSIS — L578 Other skin changes due to chronic exposure to nonionizing radiation: Secondary | ICD-10-CM | POA: Diagnosis not present

## 2022-11-04 DIAGNOSIS — L821 Other seborrheic keratosis: Secondary | ICD-10-CM | POA: Diagnosis not present

## 2022-11-05 DIAGNOSIS — M5459 Other low back pain: Secondary | ICD-10-CM | POA: Diagnosis not present

## 2022-11-05 DIAGNOSIS — G809 Cerebral palsy, unspecified: Secondary | ICD-10-CM | POA: Diagnosis not present

## 2022-11-05 DIAGNOSIS — M4140 Neuromuscular scoliosis, site unspecified: Secondary | ICD-10-CM | POA: Diagnosis not present

## 2022-11-05 DIAGNOSIS — M546 Pain in thoracic spine: Secondary | ICD-10-CM | POA: Diagnosis not present

## 2022-11-06 DIAGNOSIS — M5459 Other low back pain: Secondary | ICD-10-CM | POA: Diagnosis not present

## 2022-11-06 DIAGNOSIS — M4140 Neuromuscular scoliosis, site unspecified: Secondary | ICD-10-CM | POA: Diagnosis not present

## 2022-11-06 DIAGNOSIS — G809 Cerebral palsy, unspecified: Secondary | ICD-10-CM | POA: Diagnosis not present

## 2022-11-06 DIAGNOSIS — M546 Pain in thoracic spine: Secondary | ICD-10-CM | POA: Diagnosis not present

## 2022-11-07 ENCOUNTER — Encounter: Payer: Self-pay | Admitting: Cardiology

## 2022-11-07 DIAGNOSIS — M1711 Unilateral primary osteoarthritis, right knee: Secondary | ICD-10-CM | POA: Diagnosis not present

## 2022-11-10 DIAGNOSIS — M5459 Other low back pain: Secondary | ICD-10-CM | POA: Diagnosis not present

## 2022-11-10 DIAGNOSIS — M546 Pain in thoracic spine: Secondary | ICD-10-CM | POA: Diagnosis not present

## 2022-11-10 DIAGNOSIS — M4140 Neuromuscular scoliosis, site unspecified: Secondary | ICD-10-CM | POA: Diagnosis not present

## 2022-11-10 DIAGNOSIS — G809 Cerebral palsy, unspecified: Secondary | ICD-10-CM | POA: Diagnosis not present

## 2022-11-11 ENCOUNTER — Telehealth: Payer: Self-pay | Admitting: Cardiology

## 2022-11-11 NOTE — Telephone Encounter (Signed)
Patient's husband reports that they were leaving the movie theater last night and walking down the ramp inside the building and the patient fell forward and landed on her right elbow and right side. She did not hit her head. She was able to get up after a few minutes and walk fine. She denies any signs/symptoms of bleeding. She had a watchman device placed on 8/29. Husband is concerned because they were told that it takes several weeks for the device to heal in place and they wanted to make sure that it didn't move or become damaged during the fall.

## 2022-11-11 NOTE — Telephone Encounter (Signed)
Husband called to say the patient had a fall. And unsure if the patient monitor move. Would like to speak to he nurse. Please advise

## 2022-11-12 NOTE — Telephone Encounter (Signed)
Pt's husband is calling back to speak with Noreene Larsson about the pt falling after she just got the watchmen implant. Please advise

## 2022-11-12 NOTE — Telephone Encounter (Signed)
Spoke with Holly Mcconnell - no concerns with Watchman device at this time. Patient has been made aware and reassured.

## 2022-11-13 DIAGNOSIS — M5459 Other low back pain: Secondary | ICD-10-CM | POA: Diagnosis not present

## 2022-11-13 DIAGNOSIS — M4140 Neuromuscular scoliosis, site unspecified: Secondary | ICD-10-CM | POA: Diagnosis not present

## 2022-11-13 DIAGNOSIS — G809 Cerebral palsy, unspecified: Secondary | ICD-10-CM | POA: Diagnosis not present

## 2022-11-13 DIAGNOSIS — M546 Pain in thoracic spine: Secondary | ICD-10-CM | POA: Diagnosis not present

## 2022-11-21 DIAGNOSIS — M4140 Neuromuscular scoliosis, site unspecified: Secondary | ICD-10-CM | POA: Diagnosis not present

## 2022-11-21 DIAGNOSIS — M5459 Other low back pain: Secondary | ICD-10-CM | POA: Diagnosis not present

## 2022-11-21 DIAGNOSIS — M546 Pain in thoracic spine: Secondary | ICD-10-CM | POA: Diagnosis not present

## 2022-11-21 DIAGNOSIS — G809 Cerebral palsy, unspecified: Secondary | ICD-10-CM | POA: Diagnosis not present

## 2022-11-24 DIAGNOSIS — M5459 Other low back pain: Secondary | ICD-10-CM | POA: Diagnosis not present

## 2022-11-24 DIAGNOSIS — M546 Pain in thoracic spine: Secondary | ICD-10-CM | POA: Diagnosis not present

## 2022-11-24 DIAGNOSIS — G809 Cerebral palsy, unspecified: Secondary | ICD-10-CM | POA: Diagnosis not present

## 2022-11-24 DIAGNOSIS — M4140 Neuromuscular scoliosis, site unspecified: Secondary | ICD-10-CM | POA: Diagnosis not present

## 2022-11-25 DIAGNOSIS — E663 Overweight: Secondary | ICD-10-CM | POA: Diagnosis not present

## 2022-11-25 DIAGNOSIS — Z6826 Body mass index (BMI) 26.0-26.9, adult: Secondary | ICD-10-CM | POA: Diagnosis not present

## 2022-11-25 DIAGNOSIS — G809 Cerebral palsy, unspecified: Secondary | ICD-10-CM | POA: Diagnosis not present

## 2022-11-25 DIAGNOSIS — E782 Mixed hyperlipidemia: Secondary | ICD-10-CM | POA: Diagnosis not present

## 2022-11-27 DIAGNOSIS — G809 Cerebral palsy, unspecified: Secondary | ICD-10-CM | POA: Diagnosis not present

## 2022-11-27 DIAGNOSIS — M546 Pain in thoracic spine: Secondary | ICD-10-CM | POA: Diagnosis not present

## 2022-11-27 DIAGNOSIS — M5459 Other low back pain: Secondary | ICD-10-CM | POA: Diagnosis not present

## 2022-11-27 DIAGNOSIS — M4140 Neuromuscular scoliosis, site unspecified: Secondary | ICD-10-CM | POA: Diagnosis not present

## 2022-11-28 DIAGNOSIS — M25561 Pain in right knee: Secondary | ICD-10-CM | POA: Diagnosis not present

## 2022-11-28 DIAGNOSIS — M25562 Pain in left knee: Secondary | ICD-10-CM | POA: Diagnosis not present

## 2022-12-01 DIAGNOSIS — M4140 Neuromuscular scoliosis, site unspecified: Secondary | ICD-10-CM | POA: Diagnosis not present

## 2022-12-01 DIAGNOSIS — M5459 Other low back pain: Secondary | ICD-10-CM | POA: Diagnosis not present

## 2022-12-01 DIAGNOSIS — G809 Cerebral palsy, unspecified: Secondary | ICD-10-CM | POA: Diagnosis not present

## 2022-12-01 DIAGNOSIS — M546 Pain in thoracic spine: Secondary | ICD-10-CM | POA: Diagnosis not present

## 2022-12-02 NOTE — Progress Notes (Unsigned)
Sign    Days of Exercise per Week: 5 days    Minutes of Exercise per Session: 60 min  Stress: No Stress Concern Present (12/07/2020)   Received from [facility], [facility] of Occupational Health - Occupational Stress Questionnaire    Feeling of Stress : Only a little  Social Connections: Unknown (07/16/2021)   Received from [facility], [facility]   Social Network    Social Network: Not on file     Family History: The patient's family history includes Atrial fibrillation in her maternal grandfather and maternal uncle; Atrial fibrillation (age of onset: 33) in her mother; Breast cancer in her maternal grandmother; CAD in her mother; COPD in her mother; Colon cancer in her maternal grandmother; Hodgkin's lymphoma in her father; Hypercholesterolemia in her mother; Hyperlipidemia in her sister; Hypertension in her mother and sister; Hypothyroidism in her mother; Lung cancer in her father; Pancreatic cancer in her sister; Stroke in her mother.  ROS:   Please see the history of present illness.    All other systems reviewed and are negative.  EKGs/Labs/Other Studies Reviewed:    The following studies were reviewed today:   Cardiac Studies & Procedures       ECHOCARDIOGRAM  ECHOCARDIOGRAM COMPLETE 09/24/2022  Narrative ECHOCARDIOGRAM REPORT    Patient Name:   Holly Mcconnell Date of Exam:  09/24/2022 Medical Rec #:  [id number]               Height:       59.5 in Accession #:    [id number]              Weight:       143.0 lb Date of Birth:  03/24/1945               BSA:          1.609 m Patient Age:    76 years                BP:           114/68 mmHg Patient Gender: F                       HR:           72 bpm. Exam Location:  [address]  Procedure: 2D Echo, Cardiac Doppler, Color Doppler and Strain Analysis  Indications:    I48.92* Unspecified atrial flutter  History:        Patient has prior history of Echocardiogram examinations, most recent 08/12/2022. Risk Factors:Hypertension and Dyslipidemia. Cerebral palsy. Paroxysmal tachycardia. Spinal stenosis.  Sonographer:    Cathie Beams RCS Referring Phys: [id number] CHRISTOPHER L SCHUMANN  IMPRESSIONS   1. Left ventricular ejection fraction, by estimation, is 55 to 60%. The left ventricle has normal function. The left ventricle has no regional wall motion abnormalities. Left ventricular diastolic parameters were normal. The average left ventricular global longitudinal strain is -20.1 %. The global longitudinal strain is normal. 2. Right ventricular systolic function is normal. The right ventricular size is normal. There is normal pulmonary artery systolic pressure. The estimated right ventricular systolic pressure is 23.4 mmHg. 3. The mitral valve is grossly normal. Mild mitral valve regurgitation. No evidence of mitral stenosis. 4. The aortic valve is tricuspid. Aortic valve regurgitation is not visualized. No aortic stenosis is present. 5. The inferior vena cava is normal in size with greater than 50% respiratory variability, suggesting right atrial pressure of  TRICUSPID VALVE MV Area (PHT): 5.66 cm    TR Peak grad:   20.4 mmHg MV Decel Time: 134 msec    TR Vmax:        226.00 cm/s MV E velocity: 98.50 cm/s  Estimated RAP:  3.00 mmHg MV A velocity: 69.20 cm/s  RVSP:           23.4 mmHg MV E/A ratio:  1.42 SHUNTS Systemic VTI:  0.16 m Systemic Diam: 2.00 cm  Lennie Odor MD Electronically signed by Lennie Odor MD Signature Date/Time: 09/24/2022/4:02:54 PM    Final   TEE  ECHO TEE 10/30/2022  Narrative TRANSESOPHOGEAL ECHO REPORT    Patient Name:   Holly Mcconnell Date of Exam: 10/30/2022 Medical Rec #:  [id number]               Height:       59.0 in Accession #:    [id number]              Weight:       142.0 lb Date of Birth:  1945-07-01               BSA:          1.595 m Patient Age:    76 years                BP:           130/66 mmHg Patient Gender: F                       HR:           90 bpm. Exam Location:  Inpatient  Procedure: 2D Echo, 3D Echo, Cardiac Doppler and Color Doppler  Indications:     Watchman  History:         Patient has prior history of Echocardiogram examinations, most recent 09/24/2022. Abnormal ECG.  Sonographer:     Darlys Gales Referring Phys:  [id number] Lanier Prude Diagnosing Phys: Riley Lam MD  PROCEDURE: After discussion of the risks and benefits of a TEE, an informed consent was  obtained from the patient. The transesophogeal probe was passed without difficulty through the esophogus of the patient. Sedation performed by different physician. The patient developed no complications during the procedure.  IMPRESSIONS   1. Prior to procedure a small left atrial appendage with two distal lobes was patent. Maximal landing zone width 1.97 cm. Maximal wall distant 1.95 cm. A mid-mid transeptal puncture was performed without incident. Estimated left atrial pressure via [place]'s Formular 14 mm Hg. A 27 mm Watchman FLX pro was placed in the left atrial appendage. Average compression 15%. No peri-device leak. 1/3 Mitral shoulder. Successful Watchman placement. residula left to right shunt. No change in pericardial space post procedure. Left atrial size was severely dilated. No left atrial/left atrial appendage thrombus was detected. 2. Left ventricular ejection fraction, by estimation, is 60 to 65%. The left ventricle has normal function. 3. Right ventricular systolic function is normal. The right ventricular size is normal. 4. Right atrial size was mild to moderately dilated. 5. The mitral valve is grossly normal. Trivial mitral valve regurgitation. No evidence of mitral stenosis. 6. The aortic valve is tricuspid. Aortic valve regurgitation is not visualized. No aortic stenosis is present. 7. Cannot exclude a small PFO.  FINDINGS Left Ventricle: Left ventricular ejection fraction, by estimation, is 60 to 65%. The left ventricle has normal function. The left ventricular internal cavity size was  [facility]                                     Cardiology Office Note:    Date:  12/03/2022   ID:  Amandeep Hogston, DOB [birth year], MRN [id number]  PCP:  Gweneth Dimitri, MD  [facility] Cardiologist:  Little Ishikawa, MD  [facility] Electrophysiologist:  Lanier Prude, MD   Referring MD: Gweneth Dimitri, MD   Chief Complaint  Patient presents with   Follow-up    1 month s/p LAAO    History of Present Illness:    Holly Mcconnell is a 77 y.o. female with a hx of cerebral palsy, HTN, atrial flutter and atrial fibrillation who is s/p LAAO and is being seen today for 1 month follow up.    Ms. Reedy wore a ZIO monitor 03/2022 for 14 days which showed a 9% atrial flutter burden with an average rate of 134 bpm. She was seen by Dr Lalla Brothers and underwent afib and flutter ablation on 08/12/22. Given her hx of chronic falls plan was to proceed with Watchman after ablation to avoid long term anticoagulation.  In ablation follow up, she was doing well with no concerns and wished to pursue LAAO closure and is now s/p successful placement of 27mm Watchman FLX Pro device. She was restarted on home dose Eliquis and will continue for 45 days (12/14/22) at which time she will stop and start Plavix 75mg  to complete 6 months of therapy (2/29/25). A repeat CT will be performed in approximately 60 days to ensure proper seal of the device.   She is here today and reports she has been very well since implant. She did suffer an additional fall but no adverse events. Otherwise denies chest pain, SOB, palpitations, LE edema, orthopnea, PND, dizziness, or syncope. Denies bleeding in stool or urine.    Past Medical History:  Diagnosis Date   Cerebral palsy (HCC)    Mild symptoms.   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 Lalla Brothers    Past Surgical History:  Procedure Laterality Date    ATRIAL FIBRILLATION ABLATION N/A 08/12/2022   Procedure: ATRIAL FIBRILLATION ABLATION;  Surgeon: Lanier Prude, MD;  Location: [facility] INVASIVE CV LAB;  Service: Cardiovascular;  Laterality: N/A;   CHOLECYSTECTOMY  1989   LEFT ATRIAL APPENDAGE OCCLUSION N/A 10/30/2022   Procedure: LEFT ATRIAL APPENDAGE OCCLUSION;  Surgeon: Lanier Prude, MD;  Location: [facility] INVASIVE CV LAB;  Service: Cardiovascular;  Laterality: N/A;   TEE WITHOUT CARDIOVERSION N/A 08/12/2022   Procedure: TRANSESOPHAGEAL ECHOCARDIOGRAM;  Surgeon: Lanier Prude, MD;  Location: [facility] INVASIVE CV LAB;  Service: Cardiovascular;  Laterality: N/A;   TEE WITHOUT CARDIOVERSION N/A 10/30/2022   Procedure: TRANSESOPHAGEAL ECHOCARDIOGRAM;  Surgeon: Lanier Prude, MD;  Location: [facility] INVASIVE CV LAB;  Service: Cardiovascular;  Laterality: N/A;   TEMPOROMANDIBULAR JOINT ARTHROPLASTY     Thyroid nodule resection  2005   TOTAL ABDOMINAL HYSTERECTOMY W/ BILATERAL SALPINGOOPHORECTOMY  1982    Current Medications: Current Meds  Medication Sig   atorvastatin (LIPITOR) 20 MG tablet Take 20 mg by mouth at bedtime.   Calcium Carbonate-Vit D-Min (CALCIUM 1200 PO) Take 1,200 mg by mouth every evening.   clopidogrel (PLAVIX) 75 MG tablet Take 1 tablet (75 mg total) by mouth daily.  [facility]                                     Cardiology Office Note:    Date:  12/03/2022   ID:  Amandeep Hogston, DOB [birth year], MRN [id number]  PCP:  Gweneth Dimitri, MD  [facility] Cardiologist:  Little Ishikawa, MD  [facility] Electrophysiologist:  Lanier Prude, MD   Referring MD: Gweneth Dimitri, MD   Chief Complaint  Patient presents with   Follow-up    1 month s/p LAAO    History of Present Illness:    Holly Mcconnell is a 77 y.o. female with a hx of cerebral palsy, HTN, atrial flutter and atrial fibrillation who is s/p LAAO and is being seen today for 1 month follow up.    Ms. Reedy wore a ZIO monitor 03/2022 for 14 days which showed a 9% atrial flutter burden with an average rate of 134 bpm. She was seen by Dr Lalla Brothers and underwent afib and flutter ablation on 08/12/22. Given her hx of chronic falls plan was to proceed with Watchman after ablation to avoid long term anticoagulation.  In ablation follow up, she was doing well with no concerns and wished to pursue LAAO closure and is now s/p successful placement of 27mm Watchman FLX Pro device. She was restarted on home dose Eliquis and will continue for 45 days (12/14/22) at which time she will stop and start Plavix 75mg  to complete 6 months of therapy (2/29/25). A repeat CT will be performed in approximately 60 days to ensure proper seal of the device.   She is here today and reports she has been very well since implant. She did suffer an additional fall but no adverse events. Otherwise denies chest pain, SOB, palpitations, LE edema, orthopnea, PND, dizziness, or syncope. Denies bleeding in stool or urine.    Past Medical History:  Diagnosis Date   Cerebral palsy (HCC)    Mild symptoms.   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 Lalla Brothers    Past Surgical History:  Procedure Laterality Date    ATRIAL FIBRILLATION ABLATION N/A 08/12/2022   Procedure: ATRIAL FIBRILLATION ABLATION;  Surgeon: Lanier Prude, MD;  Location: [facility] INVASIVE CV LAB;  Service: Cardiovascular;  Laterality: N/A;   CHOLECYSTECTOMY  1989   LEFT ATRIAL APPENDAGE OCCLUSION N/A 10/30/2022   Procedure: LEFT ATRIAL APPENDAGE OCCLUSION;  Surgeon: Lanier Prude, MD;  Location: [facility] INVASIVE CV LAB;  Service: Cardiovascular;  Laterality: N/A;   TEE WITHOUT CARDIOVERSION N/A 08/12/2022   Procedure: TRANSESOPHAGEAL ECHOCARDIOGRAM;  Surgeon: Lanier Prude, MD;  Location: [facility] INVASIVE CV LAB;  Service: Cardiovascular;  Laterality: N/A;   TEE WITHOUT CARDIOVERSION N/A 10/30/2022   Procedure: TRANSESOPHAGEAL ECHOCARDIOGRAM;  Surgeon: Lanier Prude, MD;  Location: [facility] INVASIVE CV LAB;  Service: Cardiovascular;  Laterality: N/A;   TEMPOROMANDIBULAR JOINT ARTHROPLASTY     Thyroid nodule resection  2005   TOTAL ABDOMINAL HYSTERECTOMY W/ BILATERAL SALPINGOOPHORECTOMY  1982    Current Medications: Current Meds  Medication Sig   atorvastatin (LIPITOR) 20 MG tablet Take 20 mg by mouth at bedtime.   Calcium Carbonate-Vit D-Min (CALCIUM 1200 PO) Take 1,200 mg by mouth every evening.   clopidogrel (PLAVIX) 75 MG tablet Take 1 tablet (75 mg total) by mouth daily.  Sign    Days of Exercise per Week: 5 days    Minutes of Exercise per Session: 60 min  Stress: No Stress Concern Present (12/07/2020)   Received from [facility] [place] of Occupational Health - Occupational Stress Questionnaire    Feeling of Stress : Only a little  Social Connections: Unknown (07/16/2021)   Received from [facility], [facility]   Social Network    Social Network: Not on file     Family History: The patient's family history includes Atrial fibrillation in her maternal grandfather and maternal uncle; Atrial fibrillation (age of onset: 33) in her mother; Breast cancer in her maternal grandmother; CAD in her mother; COPD in her mother; Colon cancer in her maternal grandmother; Hodgkin's lymphoma in her father; Hypercholesterolemia in her mother; Hyperlipidemia in her sister; Hypertension in her mother and sister; Hypothyroidism in her mother; Lung cancer in her father; Pancreatic cancer in her sister; Stroke in her mother.  ROS:   Please see the history of present illness.    All other systems reviewed and are negative.  EKGs/Labs/Other Studies Reviewed:    The following studies were reviewed today:   Cardiac Studies & Procedures       ECHOCARDIOGRAM  ECHOCARDIOGRAM COMPLETE 09/24/2022  Narrative ECHOCARDIOGRAM REPORT    Patient Name:   Holly Mcconnell Date of Exam:  09/24/2022 Medical Rec #:  [id number]               Height:       59.5 in Accession #:    [id number]              Weight:       143.0 lb Date of Birth:  03/24/1945               BSA:          1.609 m Patient Age:    76 years                BP:           114/68 mmHg Patient Gender: F                       HR:           72 bpm. Exam Location:  [address]  Procedure: 2D Echo, Cardiac Doppler, Color Doppler and Strain Analysis  Indications:    I48.92* Unspecified atrial flutter  History:        Patient has prior history of Echocardiogram examinations, most recent 08/12/2022. Risk Factors:Hypertension and Dyslipidemia. Cerebral palsy. Paroxysmal tachycardia. Spinal stenosis.  Sonographer:    Cathie Beams RCS Referring Phys: [id number] CHRISTOPHER L SCHUMANN  IMPRESSIONS   1. Left ventricular ejection fraction, by estimation, is 55 to 60%. The left ventricle has normal function. The left ventricle has no regional wall motion abnormalities. Left ventricular diastolic parameters were normal. The average left ventricular global longitudinal strain is -20.1 %. The global longitudinal strain is normal. 2. Right ventricular systolic function is normal. The right ventricular size is normal. There is normal pulmonary artery systolic pressure. The estimated right ventricular systolic pressure is 23.4 mmHg. 3. The mitral valve is grossly normal. Mild mitral valve regurgitation. No evidence of mitral stenosis. 4. The aortic valve is tricuspid. Aortic valve regurgitation is not visualized. No aortic stenosis is present. 5. The inferior vena cava is normal in size with greater than 50% respiratory variability, suggesting right atrial pressure of  TRICUSPID VALVE MV Area (PHT): 5.66 cm    TR Peak grad:   20.4 mmHg MV Decel Time: 134 msec    TR Vmax:        226.00 cm/s MV E velocity: 98.50 cm/s  Estimated RAP:  3.00 mmHg MV A velocity: 69.20 cm/s  RVSP:           23.4 mmHg MV E/A ratio:  1.42 SHUNTS Systemic VTI:  0.16 m Systemic Diam: 2.00 cm  Lennie Odor MD Electronically signed by Lennie Odor MD Signature Date/Time: 09/24/2022/4:02:54 PM    Final   TEE  ECHO TEE 10/30/2022  Narrative TRANSESOPHOGEAL ECHO REPORT    Patient Name:   Holly Mcconnell Date of Exam: 10/30/2022 Medical Rec #:  [id number]               Height:       59.0 in Accession #:    [id number]              Weight:       142.0 lb Date of Birth:  1945-07-01               BSA:          1.595 m Patient Age:    76 years                BP:           130/66 mmHg Patient Gender: F                       HR:           90 bpm. Exam Location:  Inpatient  Procedure: 2D Echo, 3D Echo, Cardiac Doppler and Color Doppler  Indications:     Watchman  History:         Patient has prior history of Echocardiogram examinations, most recent 09/24/2022. Abnormal ECG.  Sonographer:     Darlys Gales Referring Phys:  [id number] Lanier Prude Diagnosing Phys: Riley Lam MD  PROCEDURE: After discussion of the risks and benefits of a TEE, an informed consent was  obtained from the patient. The transesophogeal probe was passed without difficulty through the esophogus of the patient. Sedation performed by different physician. The patient developed no complications during the procedure.  IMPRESSIONS   1. Prior to procedure a small left atrial appendage with two distal lobes was patent. Maximal landing zone width 1.97 cm. Maximal wall distant 1.95 cm. A mid-mid transeptal puncture was performed without incident. Estimated left atrial pressure via [place]'s Formular 14 mm Hg. A 27 mm Watchman FLX pro was placed in the left atrial appendage. Average compression 15%. No peri-device leak. 1/3 Mitral shoulder. Successful Watchman placement. residula left to right shunt. No change in pericardial space post procedure. Left atrial size was severely dilated. No left atrial/left atrial appendage thrombus was detected. 2. Left ventricular ejection fraction, by estimation, is 60 to 65%. The left ventricle has normal function. 3. Right ventricular systolic function is normal. The right ventricular size is normal. 4. Right atrial size was mild to moderately dilated. 5. The mitral valve is grossly normal. Trivial mitral valve regurgitation. No evidence of mitral stenosis. 6. The aortic valve is tricuspid. Aortic valve regurgitation is not visualized. No aortic stenosis is present. 7. Cannot exclude a small PFO.  FINDINGS Left Ventricle: Left ventricular ejection fraction, by estimation, is 60 to 65%. The left ventricle has normal function. The left ventricular internal cavity size was  [facility]                                     Cardiology Office Note:    Date:  12/03/2022   ID:  Amandeep Hogston, DOB [birth year], MRN [id number]  PCP:  Gweneth Dimitri, MD  [facility] Cardiologist:  Little Ishikawa, MD  [facility] Electrophysiologist:  Lanier Prude, MD   Referring MD: Gweneth Dimitri, MD   Chief Complaint  Patient presents with   Follow-up    1 month s/p LAAO    History of Present Illness:    Holly Mcconnell is a 77 y.o. female with a hx of cerebral palsy, HTN, atrial flutter and atrial fibrillation who is s/p LAAO and is being seen today for 1 month follow up.    Ms. Reedy wore a ZIO monitor 03/2022 for 14 days which showed a 9% atrial flutter burden with an average rate of 134 bpm. She was seen by Dr Lalla Brothers and underwent afib and flutter ablation on 08/12/22. Given her hx of chronic falls plan was to proceed with Watchman after ablation to avoid long term anticoagulation.  In ablation follow up, she was doing well with no concerns and wished to pursue LAAO closure and is now s/p successful placement of 27mm Watchman FLX Pro device. She was restarted on home dose Eliquis and will continue for 45 days (12/14/22) at which time she will stop and start Plavix 75mg  to complete 6 months of therapy (2/29/25). A repeat CT will be performed in approximately 60 days to ensure proper seal of the device.   She is here today and reports she has been very well since implant. She did suffer an additional fall but no adverse events. Otherwise denies chest pain, SOB, palpitations, LE edema, orthopnea, PND, dizziness, or syncope. Denies bleeding in stool or urine.    Past Medical History:  Diagnosis Date   Cerebral palsy (HCC)    Mild symptoms.   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 Lalla Brothers    Past Surgical History:  Procedure Laterality Date    ATRIAL FIBRILLATION ABLATION N/A 08/12/2022   Procedure: ATRIAL FIBRILLATION ABLATION;  Surgeon: Lanier Prude, MD;  Location: [facility] INVASIVE CV LAB;  Service: Cardiovascular;  Laterality: N/A;   CHOLECYSTECTOMY  1989   LEFT ATRIAL APPENDAGE OCCLUSION N/A 10/30/2022   Procedure: LEFT ATRIAL APPENDAGE OCCLUSION;  Surgeon: Lanier Prude, MD;  Location: [facility] INVASIVE CV LAB;  Service: Cardiovascular;  Laterality: N/A;   TEE WITHOUT CARDIOVERSION N/A 08/12/2022   Procedure: TRANSESOPHAGEAL ECHOCARDIOGRAM;  Surgeon: Lanier Prude, MD;  Location: [facility] INVASIVE CV LAB;  Service: Cardiovascular;  Laterality: N/A;   TEE WITHOUT CARDIOVERSION N/A 10/30/2022   Procedure: TRANSESOPHAGEAL ECHOCARDIOGRAM;  Surgeon: Lanier Prude, MD;  Location: [facility] INVASIVE CV LAB;  Service: Cardiovascular;  Laterality: N/A;   TEMPOROMANDIBULAR JOINT ARTHROPLASTY     Thyroid nodule resection  2005   TOTAL ABDOMINAL HYSTERECTOMY W/ BILATERAL SALPINGOOPHORECTOMY  1982    Current Medications: Current Meds  Medication Sig   atorvastatin (LIPITOR) 20 MG tablet Take 20 mg by mouth at bedtime.   Calcium Carbonate-Vit D-Min (CALCIUM 1200 PO) Take 1,200 mg by mouth every evening.   clopidogrel (PLAVIX) 75 MG tablet Take 1 tablet (75 mg total) by mouth daily.

## 2022-12-03 ENCOUNTER — Ambulatory Visit: Payer: PPO | Attending: Internal Medicine | Admitting: Cardiology

## 2022-12-03 ENCOUNTER — Other Ambulatory Visit: Payer: Self-pay | Admitting: Cardiology

## 2022-12-03 ENCOUNTER — Ambulatory Visit: Payer: PPO | Admitting: Cardiology

## 2022-12-03 VITALS — BP 96/56 | HR 66 | Wt 141.8 lb

## 2022-12-03 DIAGNOSIS — I4891 Unspecified atrial fibrillation: Secondary | ICD-10-CM | POA: Diagnosis not present

## 2022-12-03 DIAGNOSIS — I479 Paroxysmal tachycardia, unspecified: Secondary | ICD-10-CM | POA: Diagnosis not present

## 2022-12-03 DIAGNOSIS — Z95818 Presence of other cardiac implants and grafts: Secondary | ICD-10-CM

## 2022-12-03 DIAGNOSIS — G809 Cerebral palsy, unspecified: Secondary | ICD-10-CM | POA: Diagnosis not present

## 2022-12-03 MED ORDER — AZITHROMYCIN 500 MG PO TABS: 500.0000 mg | ORAL_TABLET | Freq: Once | ORAL | 0 refills | Status: DC

## 2022-12-03 MED ORDER — CLOPIDOGREL BISULFATE 75 MG PO TABS: 75.0000 mg | ORAL_TABLET | Freq: Every day | ORAL | 12 refills | Status: DC

## 2022-12-03 MED ORDER — AZITHROMYCIN 250 MG PO TABS: 500.0000 mg | ORAL_TABLET | Freq: Once | ORAL | 0 refills | Status: AC

## 2022-12-03 NOTE — Patient Instructions (Addendum)
Medication Instructions:  STOP ELIQUIS 12-14-22 START PLAVIX 75MG  DAILY 12-15-22  TAKE YOUR AZITHROMYCIN 500MG  (2 TAB) 1 HOUR BEFORE YOUR DENTAL CLEANINGS *If you need a refill on your cardiac medications before your next appointment, please call your pharmacy*  Lab Work: BMET TODAY If you have labs (blood work) drawn today and your tests are completely normal, you will receive your results only by: MyChart Message (if you have MyChart) OR  A paper copy in the mail If you have any lab test that is abnormal or we need to change your treatment, we will call you to review the results.  Testing/Procedures: NONE   Follow-Up: At [facility], you and your health needs are our priority.  As part of our continuing mission to provide you with exceptional heart care, we have created designated Provider Care Teams.  These Care Teams include your primary Cardiologist (physician) and Advanced Practice Providers (APPs -  Physician Assistants and Nurse Practitioners) who all work together to provide you with the care you need, when you need it.  Your next appointment:   6 MONTHS  Provider:   Georgie Chard, NP  [facility]  Other Instructions

## 2022-12-03 NOTE — Progress Notes (Signed)
error 

## 2022-12-04 DIAGNOSIS — M4140 Neuromuscular scoliosis, site unspecified: Secondary | ICD-10-CM | POA: Diagnosis not present

## 2022-12-04 DIAGNOSIS — M546 Pain in thoracic spine: Secondary | ICD-10-CM | POA: Diagnosis not present

## 2022-12-04 DIAGNOSIS — G809 Cerebral palsy, unspecified: Secondary | ICD-10-CM | POA: Diagnosis not present

## 2022-12-04 DIAGNOSIS — M5459 Other low back pain: Secondary | ICD-10-CM | POA: Diagnosis not present

## 2022-12-04 LAB — BASIC METABOLIC PANEL
BUN/Creatinine Ratio: 25 (ref 12–28)
BUN: 17 mg/dL (ref 8–27)
CO2: 22 mmol/L (ref 20–29)
Calcium: 9.7 mg/dL (ref 8.7–10.3)
Chloride: 105 mmol/L (ref 96–106)
Creatinine, Ser: 0.68 mg/dL (ref 0.57–1.00)
Glucose: 86 mg/dL (ref 70–99)
Potassium: 4.3 mmol/L (ref 3.5–5.2)
Sodium: 141 mmol/L (ref 134–144)
eGFR: 90 mL/min/{1.73_m2} (ref >59)

## 2022-12-06 DIAGNOSIS — M25561 Pain in right knee: Secondary | ICD-10-CM | POA: Diagnosis not present

## 2022-12-08 DIAGNOSIS — M546 Pain in thoracic spine: Secondary | ICD-10-CM | POA: Diagnosis not present

## 2022-12-08 DIAGNOSIS — G809 Cerebral palsy, unspecified: Secondary | ICD-10-CM | POA: Diagnosis not present

## 2022-12-08 DIAGNOSIS — M4140 Neuromuscular scoliosis, site unspecified: Secondary | ICD-10-CM | POA: Diagnosis not present

## 2022-12-08 DIAGNOSIS — M5459 Other low back pain: Secondary | ICD-10-CM | POA: Diagnosis not present

## 2022-12-11 DIAGNOSIS — G809 Cerebral palsy, unspecified: Secondary | ICD-10-CM | POA: Diagnosis not present

## 2022-12-11 DIAGNOSIS — M546 Pain in thoracic spine: Secondary | ICD-10-CM | POA: Diagnosis not present

## 2022-12-11 DIAGNOSIS — Z6827 Body mass index (BMI) 27.0-27.9, adult: Secondary | ICD-10-CM | POA: Diagnosis not present

## 2022-12-11 DIAGNOSIS — M62838 Other muscle spasm: Secondary | ICD-10-CM | POA: Diagnosis not present

## 2022-12-11 DIAGNOSIS — M5459 Other low back pain: Secondary | ICD-10-CM | POA: Diagnosis not present

## 2022-12-11 DIAGNOSIS — M4140 Neuromuscular scoliosis, site unspecified: Secondary | ICD-10-CM | POA: Diagnosis not present

## 2022-12-11 DIAGNOSIS — M6240 Contracture of muscle, unspecified site: Secondary | ICD-10-CM | POA: Diagnosis not present

## 2022-12-15 DIAGNOSIS — M546 Pain in thoracic spine: Secondary | ICD-10-CM | POA: Diagnosis not present

## 2022-12-15 DIAGNOSIS — G809 Cerebral palsy, unspecified: Secondary | ICD-10-CM | POA: Diagnosis not present

## 2022-12-15 DIAGNOSIS — M5459 Other low back pain: Secondary | ICD-10-CM | POA: Diagnosis not present

## 2022-12-15 DIAGNOSIS — M4140 Neuromuscular scoliosis, site unspecified: Secondary | ICD-10-CM | POA: Diagnosis not present

## 2022-12-19 ENCOUNTER — Encounter: Payer: Self-pay | Admitting: Physical Medicine & Rehabilitation

## 2022-12-19 DIAGNOSIS — M1711 Unilateral primary osteoarthritis, right knee: Secondary | ICD-10-CM | POA: Diagnosis not present

## 2022-12-19 DIAGNOSIS — M25561 Pain in right knee: Secondary | ICD-10-CM | POA: Diagnosis not present

## 2022-12-22 ENCOUNTER — Other Ambulatory Visit: Payer: Self-pay | Admitting: Family Medicine

## 2022-12-22 DIAGNOSIS — M546 Pain in thoracic spine: Secondary | ICD-10-CM | POA: Diagnosis not present

## 2022-12-22 DIAGNOSIS — M5459 Other low back pain: Secondary | ICD-10-CM | POA: Diagnosis not present

## 2022-12-22 DIAGNOSIS — G809 Cerebral palsy, unspecified: Secondary | ICD-10-CM | POA: Diagnosis not present

## 2022-12-22 DIAGNOSIS — M4140 Neuromuscular scoliosis, site unspecified: Secondary | ICD-10-CM | POA: Diagnosis not present

## 2022-12-22 DIAGNOSIS — Z1231 Encounter for screening mammogram for malignant neoplasm of breast: Secondary | ICD-10-CM

## 2022-12-23 DIAGNOSIS — E663 Overweight: Secondary | ICD-10-CM | POA: Diagnosis not present

## 2022-12-23 DIAGNOSIS — G809 Cerebral palsy, unspecified: Secondary | ICD-10-CM | POA: Diagnosis not present

## 2022-12-23 DIAGNOSIS — Z6827 Body mass index (BMI) 27.0-27.9, adult: Secondary | ICD-10-CM | POA: Diagnosis not present

## 2022-12-23 DIAGNOSIS — E782 Mixed hyperlipidemia: Secondary | ICD-10-CM | POA: Diagnosis not present

## 2022-12-23 NOTE — Telephone Encounter (Signed)
Sent to provider and covering for follow up

## 2022-12-24 ENCOUNTER — Other Ambulatory Visit: Payer: Self-pay | Admitting: Cardiology

## 2022-12-24 MED ORDER — AZITHROMYCIN 500 MG PO TABS: 500.0000 mg | ORAL_TABLET | Freq: Once | ORAL | 0 refills | Status: AC

## 2022-12-25 DIAGNOSIS — G809 Cerebral palsy, unspecified: Secondary | ICD-10-CM | POA: Diagnosis not present

## 2022-12-25 DIAGNOSIS — M5459 Other low back pain: Secondary | ICD-10-CM | POA: Diagnosis not present

## 2022-12-25 DIAGNOSIS — M546 Pain in thoracic spine: Secondary | ICD-10-CM | POA: Diagnosis not present

## 2022-12-25 DIAGNOSIS — M4140 Neuromuscular scoliosis, site unspecified: Secondary | ICD-10-CM | POA: Diagnosis not present

## 2022-12-29 ENCOUNTER — Telehealth (HOSPITAL_COMMUNITY): Payer: Self-pay | Admitting: *Deleted

## 2022-12-29 DIAGNOSIS — M5459 Other low back pain: Secondary | ICD-10-CM | POA: Diagnosis not present

## 2022-12-29 DIAGNOSIS — M4140 Neuromuscular scoliosis, site unspecified: Secondary | ICD-10-CM | POA: Diagnosis not present

## 2022-12-29 DIAGNOSIS — G809 Cerebral palsy, unspecified: Secondary | ICD-10-CM | POA: Diagnosis not present

## 2022-12-29 DIAGNOSIS — M546 Pain in thoracic spine: Secondary | ICD-10-CM | POA: Diagnosis not present

## 2022-12-29 NOTE — Telephone Encounter (Signed)
Reaching out to patient to offer assistance regarding upcoming cardiac imaging study; pt verbalizes understanding of appt date/time, parking situation and where to check in, name and call back number provided for further questions should they arise  Larey Brick RN Navigator Cardiac Imaging [facility] [phone] office [phone] cell  Patient aware to arrive at 1 PM

## 2022-12-30 ENCOUNTER — Ambulatory Visit (HOSPITAL_COMMUNITY)
Admission: RE | Admit: 2022-12-30 | Discharge: 2022-12-30 | Disposition: A | Discharge status: Home / Self Care | Payer: PPO | Source: Ambulatory Visit | Attending: Family Medicine | Admitting: Family Medicine

## 2022-12-30 DIAGNOSIS — Z95818 Presence of other cardiac implants and grafts: Secondary | ICD-10-CM | POA: Insufficient documentation

## 2022-12-30 DIAGNOSIS — I480 Paroxysmal atrial fibrillation: Secondary | ICD-10-CM | POA: Diagnosis not present

## 2022-12-30 MED ORDER — IOHEXOL 350 MG/ML SOLN
95.0000 mL | Freq: Once | INTRAVENOUS | Status: AC | PRN
  Administered 2022-12-30: 95 mL via INTRAVENOUS

## 2023-01-01 DIAGNOSIS — G809 Cerebral palsy, unspecified: Secondary | ICD-10-CM | POA: Diagnosis not present

## 2023-01-01 DIAGNOSIS — M5459 Other low back pain: Secondary | ICD-10-CM | POA: Diagnosis not present

## 2023-01-01 DIAGNOSIS — M4140 Neuromuscular scoliosis, site unspecified: Secondary | ICD-10-CM | POA: Diagnosis not present

## 2023-01-01 DIAGNOSIS — M546 Pain in thoracic spine: Secondary | ICD-10-CM | POA: Diagnosis not present

## 2023-01-06 ENCOUNTER — Encounter: Payer: Self-pay | Admitting: Physical Medicine & Rehabilitation

## 2023-01-06 ENCOUNTER — Encounter: Payer: PPO | Attending: Physical Medicine & Rehabilitation | Admitting: Physical Medicine & Rehabilitation

## 2023-01-06 VITALS — BP 105/66 | HR 88 | Ht 59.0 in | Wt 140.6 lb

## 2023-01-06 DIAGNOSIS — G800 Spastic quadriplegic cerebral palsy: Secondary | ICD-10-CM | POA: Insufficient documentation

## 2023-01-06 NOTE — Progress Notes (Signed)
Subjective:    Patient ID: Holly Mcconnell, female    DOB: Dec 26, 1945, 77 y.o.   MRN: [id number]  HPI CC: Left arm pain and stiffness Pleasant 77 year old female kindly referred by her primary care physician to evaluate spasticity in the left upper extremity. The patient has a history of cerebral palsy affecting both upper extremities and lower extremities.  She notes more issues with the upper extremities and the lower extremities.  She denies any neck pain.  She does have her head chronically tilted toward the left side which she states has been "forever".  Pain in the arm is described as sharp burning constant denies any numbness or tingling no pain does improve with rest.  She ambulates with a walker.  She can still ambulate steps.  She still drives.  She has not worked outside the home.  She needs some assistance with dressing as well as household duties Needs help tying shoes due to back stiffness  Sees [facility] for low back  pain    Pain Inventory Average Pain 8 Pain Right Now 8 My pain is constant, sharp, burning, dull, and aching  In the last 24 hours, has pain interfered with the following? General activity 9 Relation with others 0 Enjoyment of life 8 What TIME of day is your pain at its worst? morning , daytime, evening, and night Sleep (in general) Fair  Pain is worse with: some activites Pain improves with: rest Relief from Meds: 1  use a walker ability to climb steps?  yes do you drive?  yes  disabled: date disabled since birth (CP) retired I need assistance with the following:  dressing and household duties  spasms  Any changes since last visit?  no  Any changes since last visit?  no    Family History  Problem Relation Age of Onset   Breast cancer Maternal Grandmother    Colon cancer Maternal Grandmother    Atrial fibrillation Mother 21       Status post pacemaker   Hypertension Mother    Stroke  Mother    Hypercholesterolemia Mother    COPD Mother    Hypothyroidism Mother    CAD Mother        Diagnosed late   Atrial fibrillation Maternal Grandfather        Dilated age 22   Atrial fibrillation Maternal Uncle    Hodgkin's lymphoma Father        Died @ 36   Lung cancer Father    Pancreatic cancer Sister    Hypertension Sister    Hyperlipidemia Sister    Social History   Socioeconomic History   Marital status: Married    Spouse name: Not on file   Number of children: 1   Years of education: Not on file   Highest education level: Some college, no degree  Occupational History   Not on file  Tobacco Use   Smoking status: Never   Smokeless tobacco: Never  Substance and Sexual Activity   Alcohol use: Never   Drug use: Never   Sexual activity: Not on file  Other Topics Concern   Not on file  Social History Narrative   She is retired.  Formerly worked in [employer] education [employer].  She is married (husband's name is Michele Mcalpine -married in January 03, 1967).   They have 1 child and 8 biologic grandkids and one adopted grandkid.  She lives with her husband and is very active.     She  does cardio-strength exercises with a trainer roughly 5 days a week for 30+ minutes at a time.      She walks with a walker and has some mild speech deficits and musculoskeletal deficits from history of cerebral palsy syndrome. -- >  Does have a handicap placard      Social Determinants of Health   Financial Resource Strain: Low Risk  (12/07/2020)   Received from [facility]   Overall Financial Resource Strain (CARDIA)    Difficulty of Paying Living Expenses: Not hard at all  Food Insecurity: No Food Insecurity (12/07/2020)   Received from [facility], [facility]   Hunger Vital Sign    Worried About Running Out of Food in the Last Year: Never true    Ran Out of Food in the Last Year: Never true  Transportation Needs: No Transportation Needs (12/07/2020)   Received from [employer], [facility]   PRAPARE - Transportation    Lack of Transportation (Medical): No    Lack of Transportation (Non-Medical): No  Physical Activity: Sufficiently Active (12/07/2020)   Received from [facility]   Exercise Vital Sign    Days of Exercise per Week: 5 days    Minutes of Exercise per Session: 60 min  Stress: No Stress Concern Present (12/07/2020)   Received from [facility], [facility] Occupational Health - Occupational Stress Questionnaire    Feeling of Stress : Only a little  Social Connections: Unknown (07/16/2021)   Received from [facility], [facility]   Social Network    Social Network: Not on file   Past Surgical History:  Procedure Laterality Date   ATRIAL FIBRILLATION ABLATION N/A 08/12/2022   Procedure: ATRIAL FIBRILLATION ABLATION;  Surgeon: Lanier Prude, MD;  Location: [facility] INVASIVE CV LAB;  Service: Cardiovascular;  Laterality: N/A;   CHOLECYSTECTOMY  1989   LEFT ATRIAL APPENDAGE OCCLUSION N/A 10/30/2022   Procedure: LEFT ATRIAL APPENDAGE OCCLUSION;  Surgeon: Lanier Prude, MD;  Location: [facility] INVASIVE CV LAB;  Service: Cardiovascular;  Laterality: N/A;   TEE WITHOUT CARDIOVERSION N/A 08/12/2022   Procedure: TRANSESOPHAGEAL ECHOCARDIOGRAM;  Surgeon: Lanier Prude, MD;  Location[facility] INVAS[facility] CV LAB;  Service: Cardiovascular;  Laterality: N/A;   TEE WITHOUT CARDIOVERSION N/A 10/30/2022   Procedure: TRANSESOPHAGEAL ECHOCARDIOGRAM;  Surgeon: Lanier Prude, MD;  Location: [facility] INVASIVE CV LAB;  Service: Cardiovascular;  Laterality: N/A;   TEMPOROMANDIBULAR JOINT ARTHROPLASTY     Thyroid nodule resection  2005   TOTAL ABDOMINAL HYSTERECTOMY W/ BILATERAL SALPINGOOPHORECTOMY  1982   Past Medical History:  Diagnosis Date   Cerebral palsy (HCC)    Mild symptoms.   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 Lalla Brothers   BP 105/66   Pulse 88    Ht 4\' 11"  (1.499 m)   Wt 140 lb 9.6 oz (63.8 kg)   SpO2 97%   BMI 28.40 kg/m   Opioid Risk Score:   Fall Risk Score:  `1  Depression screen [facility] 2/9     01/06/2023   12:41 PM  Depression screen PHQ 2/9  Decreased Interest 0  Down, Depressed, Hopeless 0  PHQ - 2 Score 0  Altered sleeping 0  Tired, decreased energy 1  Change in appetite 0  Feeling bad or failure about yourself  0  Trouble concentrating 0  Moving slowly or fidgety/restless 0  Suicidal  thoughts 0  PHQ-9 Score 1      Review of Systems  Constitutional: Negative.   HENT: Negative.    Eyes: Negative.   Respiratory: Negative.    Cardiovascular: Negative.   Gastrointestinal: Negative.   Endocrine: Negative.   Genitourinary: Negative.   Musculoskeletal:  Positive for gait problem.       Spasms and some balance issues  Skin: Negative.   Allergic/Immunologic: Negative.   Hematological:  Bruises/bleeds easily.       Plavix  Psychiatric/Behavioral: Negative.    All other systems reviewed and are negative.      Objective:   Physical Exam Vitals and nursing note reviewed.  Constitutional:      Appearance: She is obese.  HENT:     Head: Normocephalic and atraumatic.  Eyes:     Extraocular Movements: Extraocular movements intact.     Conjunctiva/sclera: Conjunctivae normal.     Pupils: Pupils are equal, round, and reactive to light.  Musculoskeletal:     Comments: Neck with lateral flexion toward the left side. No pain with cervical spine range of motion. Negative impingement sign bilateral shoulders, shoulder range of motion is within functional limits with abduction forward flexion internal and external rotation. There is limited elbow extension bilaterally left greater than right side.  Minimal discomfort with end range.  She lacks approximately 10 degrees bilaterally. There is decreased supination at the left elbow compared to the right elbow which is full.  Wrist extends to neutral on the left side  but can go actively into dorsiflexion on the right side. There is thumb flexion on the left side but can be extended to full extension Left index finger is held in a partially flexed position at the MCP PIP and DIP.  Skin:    General: Skin is warm and dry.  Neurological:     General: No focal deficit present.     Mental Status: She is alert and oriented to person, place, and time.     Comments: Tone MAS 3 at the left elbow flexor MAS 2 at the wrist flexor MAS 3 at the pronator MAS 3 at the thumb flexor MAS 2 at the finger flexors on the left side Right-sided tone MAS 2 at the elbow flexor MAS 0 at the wrist and finger flexors MAS 0 at the pronator.  Strength 4 - bilateral deltoid bicep tricep finger flexors 3 - finger extension on the left side 4 - on the right side. Lower extremity strength is 5/5 bilateral hip flexor knee extensor ankle dorsiflexor There is no evidence of clonus at the ankles. Ambulates with a rollator.  She has no evidence of toe drag or knee instability she does have a valgus deformity at the right knee evident during standing  Psychiatric:        Mood and Affect: Mood normal.        Behavior: Behavior normal.     Sensation normal bilateral upper extremities to light touch      Assessment & Plan:   1.  Cerebral palsy spastic quadriparesis.  Tone is worse in the upper limbs in the lower limbs her tone is elevated particularly in the left upper extremity.  We discussed that her pain in the arms may be spasticity related although there are other possibilities as well including cervical radiculopathy.  She does not have any sensory loss in the upper extremities  We discussed the possibility of using botulinum toxin in the left upper extremity to reduce tone.  We discussed that botulinum toxin injections are not pain relievers per se but they reduced muscle tone which may in turn reduce pain. We discussed that the usual onset of action is approximately 1 week after  injection and that duration of response is approximately 3 months.  We also discussed that botulinum toxin injections can cause temporary weakness and for that reason we would start with a small dose which may need to be adjusted upward depending on response.  LEFT  FDS 25 FPL 25 Biceps 25  Pronator teres 25

## 2023-01-08 DIAGNOSIS — M546 Pain in thoracic spine: Secondary | ICD-10-CM | POA: Diagnosis not present

## 2023-01-08 DIAGNOSIS — M5459 Other low back pain: Secondary | ICD-10-CM | POA: Diagnosis not present

## 2023-01-08 DIAGNOSIS — M4140 Neuromuscular scoliosis, site unspecified: Secondary | ICD-10-CM | POA: Diagnosis not present

## 2023-01-08 DIAGNOSIS — G809 Cerebral palsy, unspecified: Secondary | ICD-10-CM | POA: Diagnosis not present

## 2023-01-13 DIAGNOSIS — M5459 Other low back pain: Secondary | ICD-10-CM | POA: Diagnosis not present

## 2023-01-13 DIAGNOSIS — G809 Cerebral palsy, unspecified: Secondary | ICD-10-CM | POA: Diagnosis not present

## 2023-01-13 DIAGNOSIS — M546 Pain in thoracic spine: Secondary | ICD-10-CM | POA: Diagnosis not present

## 2023-01-13 DIAGNOSIS — M4140 Neuromuscular scoliosis, site unspecified: Secondary | ICD-10-CM | POA: Diagnosis not present

## 2023-01-15 DIAGNOSIS — G809 Cerebral palsy, unspecified: Secondary | ICD-10-CM | POA: Diagnosis not present

## 2023-01-15 DIAGNOSIS — M5459 Other low back pain: Secondary | ICD-10-CM | POA: Diagnosis not present

## 2023-01-15 DIAGNOSIS — M546 Pain in thoracic spine: Secondary | ICD-10-CM | POA: Diagnosis not present

## 2023-01-15 DIAGNOSIS — M4140 Neuromuscular scoliosis, site unspecified: Secondary | ICD-10-CM | POA: Diagnosis not present

## 2023-01-16 ENCOUNTER — Encounter: Payer: PPO | Admitting: Physical Medicine & Rehabilitation

## 2023-01-19 ENCOUNTER — Ambulatory Visit
Admission: RE | Admit: 2023-01-19 | Discharge: 2023-01-19 | Disposition: A | Discharge status: Home / Self Care | Payer: PPO | Source: Ambulatory Visit

## 2023-01-19 DIAGNOSIS — G809 Cerebral palsy, unspecified: Secondary | ICD-10-CM | POA: Diagnosis not present

## 2023-01-19 DIAGNOSIS — M4140 Neuromuscular scoliosis, site unspecified: Secondary | ICD-10-CM | POA: Diagnosis not present

## 2023-01-19 DIAGNOSIS — Z1231 Encounter for screening mammogram for malignant neoplasm of breast: Secondary | ICD-10-CM | POA: Diagnosis not present

## 2023-01-19 DIAGNOSIS — M546 Pain in thoracic spine: Secondary | ICD-10-CM | POA: Diagnosis not present

## 2023-01-19 DIAGNOSIS — M5459 Other low back pain: Secondary | ICD-10-CM | POA: Diagnosis not present

## 2023-01-21 DIAGNOSIS — M1711 Unilateral primary osteoarthritis, right knee: Secondary | ICD-10-CM | POA: Diagnosis not present

## 2023-01-22 DIAGNOSIS — G809 Cerebral palsy, unspecified: Secondary | ICD-10-CM | POA: Diagnosis not present

## 2023-01-22 DIAGNOSIS — M5459 Other low back pain: Secondary | ICD-10-CM | POA: Diagnosis not present

## 2023-01-22 DIAGNOSIS — M546 Pain in thoracic spine: Secondary | ICD-10-CM | POA: Diagnosis not present

## 2023-01-22 DIAGNOSIS — M4140 Neuromuscular scoliosis, site unspecified: Secondary | ICD-10-CM | POA: Diagnosis not present

## 2023-01-26 ENCOUNTER — Encounter: Payer: Self-pay | Admitting: Cardiology

## 2023-01-27 ENCOUNTER — Telehealth: Payer: Self-pay | Admitting: *Deleted

## 2023-01-27 NOTE — Telephone Encounter (Signed)
   Pre-operative Risk Assessment    Patient Name: Holly Mcconnell  DOB: 01/31/1946 MRN: [id number]   Last OV: Dr. Bjorn Pippin 08/26/2022 Upcoming OV: Dr. Bjorn Pippin 02/27/2023  Request for Surgical Clearance    Procedure:   Right Total Knee Arthorplasty  Date of Surgery:  Clearance TBD                                 Surgeon:  Dr. Marcene Corning Surgeon's Group or Practice Name:  [facility]  Phone number:  [phone] Fax number:  [phone]   Type of Clearance Requested:   - Medical  - Pharmacy:  Hold Clopidogrel (Plavix) Not Indicated.    Type of Anesthesia:  Spinal   Additional requests/questions:    Signed, Emmit Pomfret   01/27/2023, 9:43 AM

## 2023-01-28 DIAGNOSIS — M546 Pain in thoracic spine: Secondary | ICD-10-CM | POA: Diagnosis not present

## 2023-01-28 DIAGNOSIS — G809 Cerebral palsy, unspecified: Secondary | ICD-10-CM | POA: Diagnosis not present

## 2023-01-28 DIAGNOSIS — M5459 Other low back pain: Secondary | ICD-10-CM | POA: Diagnosis not present

## 2023-01-28 DIAGNOSIS — M4140 Neuromuscular scoliosis, site unspecified: Secondary | ICD-10-CM | POA: Diagnosis not present

## 2023-01-28 NOTE — Telephone Encounter (Signed)
[name], we received preoperative cardiac request for Ms. Holly Mcconnell.  You saw her in clinic post LAAO.  Surgery has not yet been scheduled.  She is requesting preoperative cardiac evaluation for right total knee arthroplasty.  Which be able to proceed with surgery from a cardiac standpoint?  Thank you for your help.  Thomasene Ripple. Donica Derouin NP-C     01/28/2023, 11:07 AM [facility] [address] Office [phone] Fax [phone]

## 2023-01-28 NOTE — Telephone Encounter (Signed)
Preoperative team, patient had recent left atrial appendage closure.  She will be unable to hold anticoagulation 6 months following the procedure.  She will be eligible to hold anticoagulation until after March 2025.  Please reach out to requesting office and let them know that they will need to resubmit request 6 months after her procedure.  Thank you for your help.  Thomasene Ripple. Guenther Dunshee NP-C     01/28/2023, 11:39 AM [facility] [address] Office [phone] Fax [phone]

## 2023-01-28 NOTE — Telephone Encounter (Signed)
Faxed clearance back to requesting surgeon's office with recommendations of waiting until 05/2023.

## 2023-01-31 ENCOUNTER — Encounter: Payer: Self-pay | Admitting: Physical Medicine & Rehabilitation

## 2023-02-02 DIAGNOSIS — Z6828 Body mass index (BMI) 28.0-28.9, adult: Secondary | ICD-10-CM | POA: Diagnosis not present

## 2023-02-02 DIAGNOSIS — E039 Hypothyroidism, unspecified: Secondary | ICD-10-CM | POA: Diagnosis not present

## 2023-02-02 DIAGNOSIS — M1711 Unilateral primary osteoarthritis, right knee: Secondary | ICD-10-CM | POA: Diagnosis not present

## 2023-02-02 DIAGNOSIS — E782 Mixed hyperlipidemia: Secondary | ICD-10-CM | POA: Diagnosis not present

## 2023-02-02 DIAGNOSIS — I4819 Other persistent atrial fibrillation: Secondary | ICD-10-CM | POA: Diagnosis not present

## 2023-02-03 ENCOUNTER — Encounter: Payer: Self-pay | Admitting: Cardiology

## 2023-02-03 DIAGNOSIS — G809 Cerebral palsy, unspecified: Secondary | ICD-10-CM | POA: Diagnosis not present

## 2023-02-03 DIAGNOSIS — M4140 Neuromuscular scoliosis, site unspecified: Secondary | ICD-10-CM | POA: Diagnosis not present

## 2023-02-03 DIAGNOSIS — M5459 Other low back pain: Secondary | ICD-10-CM | POA: Diagnosis not present

## 2023-02-03 DIAGNOSIS — M546 Pain in thoracic spine: Secondary | ICD-10-CM | POA: Diagnosis not present

## 2023-02-05 DIAGNOSIS — M546 Pain in thoracic spine: Secondary | ICD-10-CM | POA: Diagnosis not present

## 2023-02-05 DIAGNOSIS — G809 Cerebral palsy, unspecified: Secondary | ICD-10-CM | POA: Diagnosis not present

## 2023-02-05 DIAGNOSIS — M4140 Neuromuscular scoliosis, site unspecified: Secondary | ICD-10-CM | POA: Diagnosis not present

## 2023-02-05 DIAGNOSIS — M5459 Other low back pain: Secondary | ICD-10-CM | POA: Diagnosis not present

## 2023-02-10 DIAGNOSIS — M5459 Other low back pain: Secondary | ICD-10-CM | POA: Diagnosis not present

## 2023-02-10 DIAGNOSIS — M4140 Neuromuscular scoliosis, site unspecified: Secondary | ICD-10-CM | POA: Diagnosis not present

## 2023-02-10 DIAGNOSIS — M546 Pain in thoracic spine: Secondary | ICD-10-CM | POA: Diagnosis not present

## 2023-02-10 DIAGNOSIS — G809 Cerebral palsy, unspecified: Secondary | ICD-10-CM | POA: Diagnosis not present

## 2023-02-12 DIAGNOSIS — M546 Pain in thoracic spine: Secondary | ICD-10-CM | POA: Diagnosis not present

## 2023-02-12 DIAGNOSIS — M4140 Neuromuscular scoliosis, site unspecified: Secondary | ICD-10-CM | POA: Diagnosis not present

## 2023-02-12 DIAGNOSIS — M5459 Other low back pain: Secondary | ICD-10-CM | POA: Diagnosis not present

## 2023-02-12 DIAGNOSIS — G809 Cerebral palsy, unspecified: Secondary | ICD-10-CM | POA: Diagnosis not present

## 2023-02-13 DIAGNOSIS — M47816 Spondylosis without myelopathy or radiculopathy, lumbar region: Secondary | ICD-10-CM | POA: Diagnosis not present

## 2023-02-16 DIAGNOSIS — M5459 Other low back pain: Secondary | ICD-10-CM | POA: Diagnosis not present

## 2023-02-16 DIAGNOSIS — M546 Pain in thoracic spine: Secondary | ICD-10-CM | POA: Diagnosis not present

## 2023-02-16 DIAGNOSIS — M4140 Neuromuscular scoliosis, site unspecified: Secondary | ICD-10-CM | POA: Diagnosis not present

## 2023-02-16 DIAGNOSIS — G809 Cerebral palsy, unspecified: Secondary | ICD-10-CM | POA: Diagnosis not present

## 2023-02-17 ENCOUNTER — Encounter: Payer: Self-pay | Admitting: Physical Medicine & Rehabilitation

## 2023-02-17 ENCOUNTER — Encounter: Payer: PPO | Attending: Physical Medicine & Rehabilitation | Admitting: Physical Medicine & Rehabilitation

## 2023-02-17 VITALS — BP 118/75 | HR 90 | Ht 59.0 in | Wt 146.8 lb

## 2023-02-17 DIAGNOSIS — G800 Spastic quadriplegic cerebral palsy: Secondary | ICD-10-CM | POA: Diagnosis not present

## 2023-02-17 MED ORDER — INCOBOTULINUMTOXINA 100 UNITS IM SOLR
100.0000 [IU] | Freq: Once | INTRAMUSCULAR | Status: AC
Start: 2023-02-17 — End: 2023-02-17
  Administered 2023-02-17: 100 [IU] via INTRAMUSCULAR

## 2023-02-17 MED ORDER — SODIUM CHLORIDE (PF) 0.9 % IJ SOLN
2.0000 mL | Freq: Once | INTRAMUSCULAR | Status: AC
Start: 2023-02-17 — End: 2023-02-17
  Administered 2023-02-17: 2 mL

## 2023-02-17 NOTE — Patient Instructions (Signed)

## 2023-02-17 NOTE — Progress Notes (Signed)
Botox Injection for spasticity using needle EMG guidance  Dilution: 50 Units/ml Indication: Severe spasticity which interferes with ADL,mobility and/or  hygiene and is unresponsive to medication management and other conservative care Informed consent was obtained after describing risks and benefits of the procedure with the patient. This includes bleeding, bruising, infection, excessive weakness, or medication side effects. A REMS form is on file and signed. Needle: 27g 1" needle electrode Number of units per muscle  LEFT total dose 100U  FDS 25 FPL 25 Biceps 25  Pronator teres 25 All injections were done after obtaining appropriate EMG activity and after negative drawback for blood. The patient tolerated the procedure well. Post procedure instructions were given. A followup appointment was made.

## 2023-02-18 DIAGNOSIS — G809 Cerebral palsy, unspecified: Secondary | ICD-10-CM | POA: Diagnosis not present

## 2023-02-18 DIAGNOSIS — M546 Pain in thoracic spine: Secondary | ICD-10-CM | POA: Diagnosis not present

## 2023-02-18 DIAGNOSIS — M4140 Neuromuscular scoliosis, site unspecified: Secondary | ICD-10-CM | POA: Diagnosis not present

## 2023-02-18 DIAGNOSIS — M5459 Other low back pain: Secondary | ICD-10-CM | POA: Diagnosis not present

## 2023-02-23 DIAGNOSIS — M546 Pain in thoracic spine: Secondary | ICD-10-CM | POA: Diagnosis not present

## 2023-02-23 DIAGNOSIS — M4140 Neuromuscular scoliosis, site unspecified: Secondary | ICD-10-CM | POA: Diagnosis not present

## 2023-02-23 DIAGNOSIS — M5459 Other low back pain: Secondary | ICD-10-CM | POA: Diagnosis not present

## 2023-02-23 DIAGNOSIS — G809 Cerebral palsy, unspecified: Secondary | ICD-10-CM | POA: Diagnosis not present

## 2023-02-26 DIAGNOSIS — M5459 Other low back pain: Secondary | ICD-10-CM | POA: Diagnosis not present

## 2023-02-26 DIAGNOSIS — G809 Cerebral palsy, unspecified: Secondary | ICD-10-CM | POA: Diagnosis not present

## 2023-02-26 DIAGNOSIS — M546 Pain in thoracic spine: Secondary | ICD-10-CM | POA: Diagnosis not present

## 2023-02-26 DIAGNOSIS — M4140 Neuromuscular scoliosis, site unspecified: Secondary | ICD-10-CM | POA: Diagnosis not present

## 2023-02-27 ENCOUNTER — Ambulatory Visit: Payer: PPO | Attending: Cardiology | Admitting: Cardiology

## 2023-02-27 VITALS — BP 98/64 | HR 83 | Ht 59.0 in | Wt 146.4 lb

## 2023-02-27 DIAGNOSIS — Z01818 Encounter for other preprocedural examination: Secondary | ICD-10-CM | POA: Diagnosis not present

## 2023-02-27 DIAGNOSIS — I4892 Unspecified atrial flutter: Secondary | ICD-10-CM | POA: Diagnosis not present

## 2023-02-27 DIAGNOSIS — E785 Hyperlipidemia, unspecified: Secondary | ICD-10-CM | POA: Diagnosis not present

## 2023-02-27 DIAGNOSIS — I10 Essential (primary) hypertension: Secondary | ICD-10-CM

## 2023-02-27 MED ORDER — METOPROLOL SUCCINATE 12.5 MG HALF TABLET
12.5000 mg | ORAL_TABLET | Freq: Every day | ORAL | Status: DC
Start: 2023-02-27 — End: 2023-04-08

## 2023-02-27 NOTE — Progress Notes (Signed)
Cardiology Office Note:    Date:  02/27/2023   ID:  Holly Mcconnell, DOB 07/10/1945, MRN [id number]  PCP:  Holly Dimitri, MD  Cardiologist:  Holly Ishikawa, MD  Electrophysiologist:  Holly Prude, MD   Referring MD: Holly Dimitri, MD   Chief Complaint  Patient presents with   Atrial Flutter    History of Present Illness:    Holly Mcconnell is a 77 y.o. female with a hx of atrial flutter, hypertension, cerebral palsy who presents for follow-up.  She previously followed with Dr. Herbie Mcconnell, last seen 01/2021.  Wore 30-day monitor 03/2018, had episode of possible atach vs aflutter, lasted 10 minutes.  Suspecting likely atrial tachycardia and she was started on nadolol and has had no further palpitations.  Switched in 08/2021 to metoprolol.    Zio patch x 14 days on 03/12/2022 showed 9% atrial flutter burden, average rate 134 bpm with longest episode lasting 1 hour with average rate 147 bpm, occasional PVCs (3% of beats).  Echocardiogram 03/20/2022 showed normal biventricular function, no significant valvular disease.  Referred to Dr. Lalla Mcconnell in EP, underwent successful A-fib/flutter ablation 08/12/2022.  Underwent Watchman placement 10/30/2022.  Echocardiogram 09/24/2022 showed normal biventricular function, no significant valvular disease.  Since last clinic visit, she reports she is doing well.  Denies any chest pain, dyspnea, lightheadedness, syncope, lower extremity edema, or palpitations.  Does report she is feeling fatigued.  She denies any bleeding on Plavix.  She can walk up flight of stairs without stopping.  She is doing physical therapy twice per week.  She walks elliptical 15 minutes at start of every therapy session.  Denies any exertional symptoms.    BP Readings from Last 3 Encounters:  02/27/23 98/64  02/17/23 118/75  01/06/23 105/66      Past Medical History:  Diagnosis Date   Cerebral palsy (HCC)    Mild symptoms.   Migraine headache     Presence of Watchman left atrial appendage closure device 10/30/2022   27mm Watchman FLX Pro by Dr. Lalla Mcconnell    Past Surgical History:  Procedure Laterality Date   ATRIAL FIBRILLATION ABLATION N/A 08/12/2022   Procedure: ATRIAL FIBRILLATION ABLATION;  Surgeon: Holly Prude, MD;  Location: [facility] INVASIVE CV LAB;  Service: Cardiovascular;  Laterality: N/A;   CHOLECYSTECTOMY  1989   LEFT ATRIAL APPENDAGE OCCLUSION N/A 10/30/2022   Procedure: LEFT ATRIAL APPENDAGE OCCLUSION;  Surgeon: Holly Prude, MD;  Location: [facility] INVASIVE CV LAB;  Service: Cardiovascular;  Laterality: N/A;   TEE WITHOUT CARDIOVERSION N/A 08/12/2022   Procedure: TRANSESOPHAGEAL ECHOCARDIOGRAM;  Surgeon: Holly Prude, MD;  Location: [facility] INVASIVE CV LAB;  Service: Cardiovascular;  Laterality: N/A;   TEE WITHOUT CARDIOVERSION N/A 10/30/2022   Procedure: TRANSESOPHAGEAL ECHOCARDIOGRAM;  Surgeon: Holly Prude, MD;  Location[facility] INVAS[facility] CV LAB;  Service: Cardiovascular;  Laterality: N/A;   TEMPOROMANDIBULAR JOINT ARTHROPLASTY     Thyroid nodule resection  2005   TOTAL ABDOMINAL HYSTERECTOMY W/ BILATERAL SALPINGOOPHORECTOMY  1982    Current Medications: Current Meds  Medication Sig   atorvastatin (LIPITOR) 20 MG tablet Take 20 mg by mouth at bedtime.   Calcium Carbonate-Vit D-Min (CALCIUM 1200 PO) Take 1,200 mg by mouth every evening.   clopidogrel (PLAVIX) 75 MG tablet Take 1 tablet (75 mg total) by mouth daily.   ibandronate (BONIVA) 150 MG tablet Take 150 mg by mouth every 30 (thirty) days.   levothyroxine (SYNTHROID, LEVOTHROID) 75 MCG tablet Take 37.5-75 mcg by mouth See admin  instructions. Take 0.5 tablet (37.5 mcg) by mouth in the morning on Sundays.  Take 1 tablet (75 mcg) by mouth once daily in the morning on all other days.   MAGNESIUM PO Take 250 mg by mouth at bedtime.   Multiple Vitamin (MULTIVITAMIN WITH MINERALS) TABS tablet Take 1 tablet by mouth every evening.   Polyethyl Glycol-Propyl Glycol  (SYSTANE) 0.4-0.3 % GEL ophthalmic gel Place 1 Application into both eyes at bedtime.   Polyethyl Glycol-Propyl Glycol (SYSTANE) 0.4-0.3 % SOLN Place 1 drop into both eyes in the morning and at bedtime.   SUMAtriptan (IMITREX) 100 MG tablet Take 100 mg by mouth every 2 (two) hours as needed for migraine. May repeat in 2 hours if headache persists or recurs.   [DISCONTINUED] metoprolol succinate (TOPROL-XL) 25 MG 24 hr tablet Take 1 tablet (25 mg total) by mouth daily.   Current Facility-Administered Medications for the 02/27/23 encounter (Office Visit) with Holly Ishikawa, MD  Medication   metoprolol succinate (TOPROL-XL) 24 hr tablet 12.5 mg     Allergies:   Clavulanic acid, Nitrofurantoin, Amoxicillin, Nabumetone, Other, Sulfa antibiotics, and Codeine   Social History   Socioeconomic History   Marital status: Married    Spouse name: Not on file   Number of children: 1   Years of education: Not on file   Highest education level: Some college, no degree  Occupational History   Not on file  Tobacco Use   Smoking status: Never   Smokeless tobacco: Never  Substance and Sexual Activity   Alcohol use: Never   Drug use: Never   Sexual activity: Not on file  Other Topics Concern   Not on file  Social History Narrative   She is retired.  Formerly worked in Community education officer.  She is married (husband's name is Holly Mcconnell -married in January 03, 1967).   They have 1 child and 8 biologic grandkids and one adopted grandkid.  She lives with her husband and is very active.     She does cardio-strength exercises with a trainer roughly 5 days a week for 30+ minutes at a time.      She walks with a walker and has some mild speech deficits and musculoskeletal deficits from history of cerebral palsy syndrome. -- >  Does have a handicap placard      Social Drivers of Health   Financial Resource Strain: Low Risk  (12/07/2020)   Received from [facility], [facility]   Overall Financial Resource  Strain (CARDIA)    Difficulty of Paying Living Expenses: Not hard at all  Food Insecurity: No Food Insecurity (12/07/2020)   Received from [facility], [facility]   Hunger Vital Sign    Worried About Running Out of Food in the Last Year: Never true    Ran Out of Food in the Last Year: Never true  Transportation Needs: No Transportation Needs (12/07/2020)   Received from [employer], [facility]   PRAPARE - Transportation    Lack of Transportation (Medical): No    Lack of Transportation (Non-Medical): No  Physical Activity: Sufficiently Active (12/07/2020)   Received from [facility], [facility]   Exercise Vital Sign    Days of Exercise per Week: 5 days    Minutes of Exercise per Session: 60 min  Stress: No Stress Concern Present (12/07/2020)   Received from [facility], [facility] - Occupational Stress Questionnaire    Feeling of Stress : Only a Holly  Social Connections: Unknown (07/16/2021)   Received from [facility], [facility]   Social Network    Social Network: Not on file     Family History: The patient's family history includes Atrial fibrillation in her maternal grandfather and maternal uncle; Atrial fibrillation (age of onset: 88) in her mother; Breast cancer in her maternal grandmother; CAD in her mother; COPD in her mother; Colon cancer in her maternal grandmother; Hodgkin's lymphoma in her father; Hypercholesterolemia in her mother; Hyperlipidemia in her sister; Hypertension in her mother and sister; Hypothyroidism in her mother; Lung cancer in her father; Pancreatic cancer in her sister; Stroke in her mother.  ROS:   Please see the history of present illness.     All other systems reviewed and are negative.  EKGs/Labs/Other Studies Reviewed:    The following studies were reviewed today:   EKG:   02/17/2022: Normal sinus rhythm, rate 70, low voltage, no ST abnormalities 03/18/2022: Normal sinus rhythm,  rate 75, no ST abnormality  Recent Labs: 10/20/2022: Hemoglobin 12.4; Platelets 288 12/03/2022: BUN 17; Creatinine, Ser 0.68; Potassium 4.3; Sodium 141  Recent Lipid Panel No results found for: "CHOL", "TRIG", "HDL", "CHOLHDL", "VLDL", "LDLCALC", "LDLDIRECT"  Physical Exam:    VS:  BP 98/64 (BP Location: Left Arm, Patient Position: Sitting, Cuff Size: Normal)   Pulse 83   Ht 4\' 11"  (1.499 m)   Wt 146 lb 6.4 oz (66.4 kg)   SpO2 98%   BMI 29.57 kg/m     Wt Readings from Last 3 Encounters:  02/27/23 146 lb 6.4 oz (66.4 kg)  02/17/23 146 lb 12.8 oz (66.6 kg)  01/06/23 140 lb 9.6 oz (63.8 kg)     GEN:   in no acute distress HEENT: Normal NECK: No JVD; No carotid bruits CARDIAC: RRR, no murmurs, rubs, gallops RESPIRATORY:  Clear to auscultation without rales, wheezing or rhonchi  ABDOMEN: Soft, non-tender, non-distended MUSCULOSKELETAL:  No edema; No deformity  SKIN: Warm and dry NEUROLOGIC:  Alert and oriented x 3 PSYCHIATRIC:  Normal affect   ASSESSMENT:    1. Pre-op evaluation   2. Atrial flutter, unspecified type (HCC)   3. Essential hypertension   4. Hyperlipidemia, unspecified hyperlipidemia type      PLAN:    Preop evaluation: Prior to knee surgery.  Functional capacity greater than 4 METS.  Denies any exertional symptoms.  RCRI score 0.  Overall would classify as low risk for intermediate risk surgery, no further cardiac workup recommended prior to procedure.  She is on Plavix for 6 months from her watchman, she will need to wait until she can come off her Plavix for the procedure, planning to stop Plavix 05/01/23  Atrial flutter: Zio patch x 14 days on 03/12/2022 showed 9% atrial flutter burden, average rate 134 bpm with longest episode lasting 1 hour with average rate 147 bpm, occasional PVCs (3% of beats).  Echocardiogram 03/20/2022 showed normal biventricular function, no significant valvular disease.  Referred to Dr. Lalla Mcconnell in EP, underwent successful A-fib/flutter  ablation 08/12/2022 -Continue toprol-XL, will decrease dose to 12.5 mg daily -CHA2DS2-VASc 4 (age x 2, hypertension, female).  She does have a history of falls with cerebral palsy and would prefer to avoid long-term anticoagulation.  Referred to Dr. Lalla Mcconnell and underwent Watchman placement 10/30/2022.  She was on Eliquis through 12/14/2022 and now on Plavix through 05/01/2023  Hypertension: On Toprol-XL 25 mg daily.  BP soft in clinic, she reports she has been feeling fatigued.  Will decrease Toprol-XL dose to  12.5 mg daily  Hyperlipidemia: On atorvastatin 20 mg daily.  LDL 69 on 04/21/22. Calcium score 0 preablation CT 08/05/22   RTC in 6 months   Medication Adjustments/Labs and Tests Ordered: Current medicines are reviewed at length with the patient today.  Concerns regarding medicines are outlined above.  Orders Placed This Encounter  Procedures   EKG 12-Lead   Meds ordered this encounter  Medications   metoprolol succinate (TOPROL-XL) 24 hr tablet 12.5 mg    Patient Instructions  Medication Instructions:  Stop Toprol XL 25 mg daily Start Toprol XL  12.5 mg daily Continue all current medications *If you need a refill on your cardiac medications before your next appointment, please call your pharmacy*   Lab Work: none If you have labs (blood work) drawn today and your tests are completely normal, you will receive your results only by: MyChart Message (if you have MyChart) OR A paper copy in the mail If you have any lab test that is abnormal or we need to change your treatment, we will call you to review the results.   Testing/Procedures: none   Follow-Up: At [facility], you and your health needs are our priority.  As part of our continuing mission to provide you with exceptional heart care, we have created designated Provider Care Teams.  These Care Teams include your primary Cardiologist (physician) and Advanced Practice Providers (APPs -  Physician Assistants and  Nurse Practitioners) who all work together to provide you with the care you need, when you need it.  We recommend signing up for the patient portal called "MyChart".  Sign up information is provided on this After Visit Summary.  MyChart is used to connect with patients for Virtual Visits (Telemedicine).  Patients are able to view lab/test results, encounter notes, upcoming appointments, etc.  Non-urgent messages can be sent to your provider as well.   To learn more about what you can do with MyChart, go to [email or web address].    Your next appointment:   6 month(s)  Provider:   Little Ishikawa, MD     Other Instructions none        Signed, Holly Ishikawa, MD  02/27/2023 9:39 AM    [facility]

## 2023-02-27 NOTE — Patient Instructions (Signed)
Medication Instructions:  Stop Toprol XL 25 mg daily Start Toprol XL  12.5 mg daily Continue all current medications *If you need a refill on your cardiac medications before your next appointment, please call your pharmacy*   Lab Work: none If you have labs (blood work) drawn today and your tests are completely normal, you will receive your results only by: MyChart Message (if you have MyChart) OR A paper copy in the mail If you have any lab test that is abnormal or we need to change your treatment, we will call you to review the results.   Testing/Procedures: none   Follow-Up: At [facility], you and your health needs are our priority.  As part of our continuing mission to provide you with exceptional heart care, we have created designated Provider Care Teams.  These Care Teams include your primary Cardiologist (physician) and Advanced Practice Providers (APPs -  Physician Assistants and Nurse Practitioners) who all work together to provide you with the care you need, when you need it.  We recommend signing up for the patient portal called "MyChart".  Sign up information is provided on this After Visit Summary.  MyChart is used to connect with patients for Virtual Visits (Telemedicine).  Patients are able to view lab/test results, encounter notes, upcoming appointments, etc.  Non-urgent messages can be sent to your provider as well.   To learn more about what you can do with MyChart, go to [email or web address].    Your next appointment:   6 month(s)  Provider:   Little Ishikawa, MD     Other Instructions none

## 2023-03-02 ENCOUNTER — Encounter: Payer: Self-pay | Admitting: Cardiology

## 2023-03-02 DIAGNOSIS — M546 Pain in thoracic spine: Secondary | ICD-10-CM | POA: Diagnosis not present

## 2023-03-02 DIAGNOSIS — M5459 Other low back pain: Secondary | ICD-10-CM | POA: Diagnosis not present

## 2023-03-02 DIAGNOSIS — G809 Cerebral palsy, unspecified: Secondary | ICD-10-CM | POA: Diagnosis not present

## 2023-03-02 DIAGNOSIS — M4140 Neuromuscular scoliosis, site unspecified: Secondary | ICD-10-CM | POA: Diagnosis not present

## 2023-03-02 NOTE — Telephone Encounter (Signed)
Per office visit note dated 12-27 w/Dr Bjorn Pippin: "wait until she can come off her Plavix for the procedure, planning to stop Plavix 05/01/23"   Pt notified that she is cleared for surgery after 05-01-23, verbalized understanding. Ortho should have received clearance she will have them call if they need anything else.

## 2023-03-31 ENCOUNTER — Encounter: Payer: PPO | Attending: Physical Medicine & Rehabilitation | Admitting: Physical Medicine & Rehabilitation

## 2023-03-31 ENCOUNTER — Ambulatory Visit: Payer: PPO | Admitting: Physical Medicine & Rehabilitation

## 2023-03-31 ENCOUNTER — Encounter: Payer: Self-pay | Admitting: Physical Medicine & Rehabilitation

## 2023-03-31 VITALS — BP 110/72 | HR 82 | Ht 59.0 in | Wt 148.2 lb

## 2023-03-31 DIAGNOSIS — G800 Spastic quadriplegic cerebral palsy: Secondary | ICD-10-CM | POA: Insufficient documentation

## 2023-03-31 NOTE — Progress Notes (Signed)
Subjective:    Patient ID: Holly Mcconnell, female    DOB: [birth year], 78 y.o.   MRN: [id number] Pleasant 78 year old female kindly referred by her primary care physician to evaluate spasticity in the left upper extremity. The patient has a history of cerebral palsy affecting both upper extremities and lower extremities.  She notes more issues with the upper extremities and the lower extremities.  She denies any neck pain.  She does have her head chronically tilted toward the left side which she states has been "forever".  Pain in the arm is described as sharp burning constant denies any numbness or tingling no pain does improve with rest.  She ambulates with a walker.  She can still ambulate steps.  She still drives.  She has not worked outside the home.  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  She states that her pain in her left upper extremity has finally gone away after 1 day postprocedure Spasticity BUE and BLE worst in LUE, had Botox 1/28  Good pain relief but has L thumb and index finger weakness  LEFT total dose 100U  FDS 25 FPL 25 Biceps 25  Pronator teres 25 Pain Inventory Average Pain 0 Pain Right Now 0 My pain is  0  In the last 24 hours, has pain interfered with the following? General activity 0 Relation with others 0 Enjoyment of life 0 What TIME of day is your pain at its worst? No pain Sleep (in general) Good  Pain is worse with:  no pain Pain improves with: no pain Relief from Meds: no pain  Family History  Problem Relation Age of Onset   Breast cancer Maternal Grandmother    Colon cancer Maternal Grandmother    Atrial fibrillation Mother 72       Status post pacemaker   Hypertension Mother    Stroke  Mother    Hypercholesterolemia Mother    COPD Mother    Hypothyroidism Mother    CAD Mother        Diagnosed late   Atrial fibrillation Maternal Grandfather        Dilated age 75   Atrial fibrillation Maternal Uncle    Hodgkin's lymphoma Father        Died @ 66   Lung cancer Father    Pancreatic cancer Sister    Hypertension Sister    Hyperlipidemia Sister    Social History   Socioeconomic History   Marital status: Married    Spouse name: Not on file   Number of children: 1   Years of education: Not on file   Highest education level: Some college, no degree  Occupational History   Not on file  Tobacco Use   Smoking status: Never   Smokeless tobacco: Never  Substance and Sexual Activity   Alcohol use: Never   Drug use: Never   Sexual activity: Not on file  Other Topics Concern   Not on file  Social History Narrative   She is retired.  Formerly worked in Community education officer.  She is married (husband's name is Holly Mcconnell -married in January 03, 1967).   They have 1 child and 8 biologic grandkids and one adopted grandkid.  She lives with her husband and is very active.     She does  cardio-strength exercises with a trainer roughly 5 days a week for 30+ minutes at a time.      She walks with a walker and has some mild speech deficits and musculoskeletal deficits from history of cerebral palsy syndrome. -- >  Does have a handicap placard      Social Drivers of Health   Financial Resource Strain: Low Risk  (12/07/2020)   Received from [facility], [facility]   Overall Financial Resource Strain (CARDIA)    Difficulty of Paying Living Expenses: Not hard at all  Food Insecurity: No Food Insecurity (12/07/2020)   Received from [facility], [facility]   Hunger Vital Sign    Worried About Running Out of Food in the Last Year: Never true    Ran Out of Food in the Last Year: Never true  Transportation Needs: No Transportation Needs (12/07/2020)   Received from [employer], [facility]   PRAPARE - Transportation    Lack of Transportation (Medical): No    Lack of Transportation (Non-Medical): No  Physical Activity: Sufficiently Active (12/07/2020)   Received from [facility], [facility]   Exercise Vital Sign    Days of Exercise per Week: 5 days    Minutes of Exercise per Session: 60 min  Stress: No Stress Concern Present (12/07/2020)   Received from [facility], [facility] of Occupational Health - Occupational Stress Questionnaire    Feeling of Stress : Only a little  Social Connections: Unknown (07/16/2021)   Received from [facility], [facility]   Social Network    Social Network: Not on file   Past Surgical History:  Procedure Laterality Date   ATRIAL FIBRILLATION ABLATION N/A 08/12/2022   Procedure: ATRIAL FIBRILLATION ABLATION;  Surgeon: Holly Prude, MD;  Location: [facility] INVASIVE CV LAB;  Service: Cardiovascular;  Laterality: N/A;   CHOLECYSTECTOMY  1989   LEFT ATRIAL APPENDAGE OCCLUSION N/A 10/30/2022   Procedure: LEFT ATRIAL APPENDAGE OCCLUSION;  Surgeon: Holly Prude, MD;  Location: [facility] INVASIVE CV LAB;  Service: Cardiovascular;  Laterality: N/A;   TEE WITHOUT CARDIOVERSION N/A 08/12/2022   Procedure: TRANSESOPHAGEAL ECHOCARDIOGRAM;  Surgeon: Holly Prude, MD;  Location: [facility] INVASIVE CV LAB;  Service: Cardiovascular;  Laterality: N/A;   TEE WITHOUT CARDIOVERSION N/A 10/30/2022   Procedure: TRANSESOPHAGEAL ECHOCARDIOGRAM;  Surgeon: Holly Prude, MD;  Location: [facility] INVASIVE CV LAB;  Service: Cardiovascular;  Laterality: N/A;   TEMPOROMANDIBULAR JOINT ARTHROPLASTY     Thyroid nodule resection  2005   TOTAL ABDOMINAL HYSTERECTOMY W/ BILATERAL SALPINGOOPHORECTOMY  1982   Past Surgical History:  Procedure Laterality Date   ATRIAL FIBRILLATION ABLATION N/A 08/12/2022   Procedure: ATRIAL FIBRILLATION ABLATION;  Surgeon: Holly Prude, MD;  Location: [facility] INVASIVE CV LAB;  Service: Cardiovascular;   Laterality: N/A;   CHOLECYSTECTOMY  1989   LEFT ATRIAL APPENDAGE OCCLUSION N/A 10/30/2022   Procedure: LEFT ATRIAL APPENDAGE OCCLUSION;  Surgeon: Holly Prude, MD;  Location: [facility] INVASIVE CV LAB;  Service: Cardiovascular;  Laterality: N/A;   TEE WITHOUT CARDIOVERSION N/A 08/12/2022   Procedure: TRANSESOPHAGEAL ECHOCARDIOGRAM;  Surgeon: Holly Prude, MD;  Location: [facility] INVASIVE CV LAB;  Service: Cardiovascular;  Laterality: N/A;   TEE WITHOUT CARDIOVERSION N/A 10/30/2022   Procedure: TRANSESOPHAGEAL ECHOCARDIOGRAM;  Surgeon: Holly Prude, MD;  Location: [facility] INVASIVE CV LAB;  Service: Cardiovascular;  Laterality: N/A;   TEMPOROMANDIBULAR JOINT ARTHROPLASTY     Thyroid nodule resection  2005   TOTAL ABDOMINAL HYSTERECTOMY W/  BILATERAL SALPINGOOPHORECTOMY  1982   Past Medical History:  Diagnosis Date   Cerebral palsy (HCC)    Mild symptoms.   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 Lalla Brothers   BP 110/72   Pulse 82   Ht 4\' 11"  (1.499 m)   Wt 148 lb 3.2 oz (67.2 kg)   SpO2 97%   BMI 29.93 kg/m   Opioid Risk Score:   Fall Risk Score:  `1  Depression screen [facility] 2/9     03/31/2023   11:44 AM 02/17/2023    2:56 PM 01/06/2023   12:41 PM  Depression screen PHQ 2/9  Decreased Interest 0 0 0  Down, Depressed, Hopeless 0 0 0  PHQ - 2 Score 0 0 0  Altered sleeping   0  Tired, decreased energy   1  Change in appetite   0  Feeling bad or failure about yourself    0  Trouble concentrating   0  Moving slowly or fidgety/restless   0  Suicidal thoughts   0  PHQ-9 Score   1     Review of Systems  All other systems reviewed and are negative.      Objective:   Physical Exam  General no acute distress Mood affect appropriate Tone MAS 0 at the left thumb flexor MAS 0 at the left finger flexors MAS 1 at the elbow flexors MAS 3 at the pronators Motor strength is 3 - at the left deltoid 4 - bicep  3 - tricep to minus finger flexors with exception of 0 at the index finger flexor and extensors 0 at the thumb flexor      Assessment & Plan:  1.  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  She does have some weakness of her thumb flexor and index finger flexor.  We discussed that this could be mitigated at subsequent treatments by dosing adjustment as well as electrical stimulation to isolate finger flexors other than the index Repeat botulinum toxin injection in 6 weeks LEFT total dose 100U  FDS 25- e stim to avoid inj of index finger portion  FPL 0 Biceps 25  Pronator teres 50

## 2023-04-07 ENCOUNTER — Encounter: Payer: Self-pay | Admitting: Cardiology

## 2023-04-07 DIAGNOSIS — I10 Essential (primary) hypertension: Secondary | ICD-10-CM

## 2023-04-08 MED ORDER — METOPROLOL SUCCINATE ER 25 MG PO TB24: 25.0000 mg | ORAL_TABLET | Freq: Every day | ORAL | 3 refills | Status: AC

## 2023-04-24 ENCOUNTER — Encounter: Payer: Self-pay | Admitting: Cardiology

## 2023-04-27 ENCOUNTER — Encounter: Payer: Self-pay | Admitting: Cardiology

## 2023-04-27 ENCOUNTER — Telehealth: Payer: Self-pay | Admitting: Physician Assistant

## 2023-04-27 NOTE — Telephone Encounter (Signed)
 Called to check in with patient, who had LAAO on 08/30/22. We discussed stopping her Plavix today. This has been removed from her med list. She has no indication for long term aspirin.  The patient reports doing well with no issues.  The patient understands to call with questions or concerns.

## 2023-04-28 ENCOUNTER — Encounter: Payer: Self-pay | Admitting: *Deleted

## 2023-04-28 NOTE — Telephone Encounter (Signed)
 Did they send a form to [place]?  She is cleared to proceed with surgery, maybe we could just reach out to their office and let them know

## 2023-05-04 ENCOUNTER — Ambulatory Visit: Payer: PPO

## 2023-05-04 ENCOUNTER — Encounter: Payer: Self-pay | Admitting: Cardiology

## 2023-05-06 ENCOUNTER — Ambulatory Visit: Payer: PPO

## 2023-05-07 DIAGNOSIS — M546 Pain in thoracic spine: Secondary | ICD-10-CM | POA: Diagnosis not present

## 2023-05-07 DIAGNOSIS — G809 Cerebral palsy, unspecified: Secondary | ICD-10-CM | POA: Diagnosis not present

## 2023-05-07 DIAGNOSIS — M4140 Neuromuscular scoliosis, site unspecified: Secondary | ICD-10-CM | POA: Diagnosis not present

## 2023-05-07 DIAGNOSIS — M5459 Other low back pain: Secondary | ICD-10-CM | POA: Diagnosis not present

## 2023-05-12 ENCOUNTER — Encounter: Payer: PPO | Admitting: Physical Medicine & Rehabilitation

## 2023-05-15 DIAGNOSIS — M4140 Neuromuscular scoliosis, site unspecified: Secondary | ICD-10-CM | POA: Diagnosis not present

## 2023-05-15 DIAGNOSIS — M546 Pain in thoracic spine: Secondary | ICD-10-CM | POA: Diagnosis not present

## 2023-05-15 DIAGNOSIS — M5459 Other low back pain: Secondary | ICD-10-CM | POA: Diagnosis not present

## 2023-05-15 DIAGNOSIS — G809 Cerebral palsy, unspecified: Secondary | ICD-10-CM | POA: Diagnosis not present

## 2023-05-18 DIAGNOSIS — R2689 Other abnormalities of gait and mobility: Secondary | ICD-10-CM | POA: Diagnosis not present

## 2023-05-18 DIAGNOSIS — Z01419 Encounter for gynecological examination (general) (routine) without abnormal findings: Secondary | ICD-10-CM | POA: Diagnosis not present

## 2023-05-18 DIAGNOSIS — R1312 Dysphagia, oropharyngeal phase: Secondary | ICD-10-CM | POA: Diagnosis not present

## 2023-05-18 DIAGNOSIS — Z0000 Encounter for general adult medical examination without abnormal findings: Secondary | ICD-10-CM | POA: Diagnosis not present

## 2023-05-18 DIAGNOSIS — M47816 Spondylosis without myelopathy or radiculopathy, lumbar region: Secondary | ICD-10-CM | POA: Diagnosis not present

## 2023-05-18 DIAGNOSIS — I4819 Other persistent atrial fibrillation: Secondary | ICD-10-CM | POA: Diagnosis not present

## 2023-05-18 DIAGNOSIS — M85852 Other specified disorders of bone density and structure, left thigh: Secondary | ICD-10-CM | POA: Diagnosis not present

## 2023-05-18 DIAGNOSIS — Z1331 Encounter for screening for depression: Secondary | ICD-10-CM | POA: Diagnosis not present

## 2023-05-18 DIAGNOSIS — E782 Mixed hyperlipidemia: Secondary | ICD-10-CM | POA: Diagnosis not present

## 2023-05-18 DIAGNOSIS — N952 Postmenopausal atrophic vaginitis: Secondary | ICD-10-CM | POA: Diagnosis not present

## 2023-05-18 DIAGNOSIS — M4140 Neuromuscular scoliosis, site unspecified: Secondary | ICD-10-CM | POA: Diagnosis not present

## 2023-05-18 DIAGNOSIS — E890 Postprocedural hypothyroidism: Secondary | ICD-10-CM | POA: Diagnosis not present

## 2023-05-18 DIAGNOSIS — G809 Cerebral palsy, unspecified: Secondary | ICD-10-CM | POA: Diagnosis not present

## 2023-05-18 DIAGNOSIS — M546 Pain in thoracic spine: Secondary | ICD-10-CM | POA: Diagnosis not present

## 2023-05-18 DIAGNOSIS — M5459 Other low back pain: Secondary | ICD-10-CM | POA: Diagnosis not present

## 2023-05-21 DIAGNOSIS — M546 Pain in thoracic spine: Secondary | ICD-10-CM | POA: Diagnosis not present

## 2023-05-21 DIAGNOSIS — M5459 Other low back pain: Secondary | ICD-10-CM | POA: Diagnosis not present

## 2023-05-21 DIAGNOSIS — G809 Cerebral palsy, unspecified: Secondary | ICD-10-CM | POA: Diagnosis not present

## 2023-05-21 DIAGNOSIS — M4140 Neuromuscular scoliosis, site unspecified: Secondary | ICD-10-CM | POA: Diagnosis not present

## 2023-05-22 ENCOUNTER — Other Ambulatory Visit: Payer: Self-pay | Admitting: Orthopaedic Surgery

## 2023-05-25 DIAGNOSIS — M1731 Unilateral post-traumatic osteoarthritis, right knee: Secondary | ICD-10-CM | POA: Diagnosis not present

## 2023-05-25 DIAGNOSIS — M546 Pain in thoracic spine: Secondary | ICD-10-CM | POA: Diagnosis not present

## 2023-05-25 DIAGNOSIS — M25661 Stiffness of right knee, not elsewhere classified: Secondary | ICD-10-CM | POA: Diagnosis not present

## 2023-05-25 DIAGNOSIS — M4140 Neuromuscular scoliosis, site unspecified: Secondary | ICD-10-CM | POA: Diagnosis not present

## 2023-05-25 DIAGNOSIS — R262 Difficulty in walking, not elsewhere classified: Secondary | ICD-10-CM | POA: Diagnosis not present

## 2023-05-25 DIAGNOSIS — G809 Cerebral palsy, unspecified: Secondary | ICD-10-CM | POA: Diagnosis not present

## 2023-05-25 DIAGNOSIS — M5459 Other low back pain: Secondary | ICD-10-CM | POA: Diagnosis not present

## 2023-05-26 ENCOUNTER — Encounter: Payer: Self-pay | Admitting: Cardiology

## 2023-05-26 ENCOUNTER — Encounter: Payer: Self-pay | Admitting: Physical Medicine & Rehabilitation

## 2023-05-27 NOTE — Care Plan (Signed)
 Ortho Bundle Case Management Note  Patient Details  Name: Holly Mcconnell MRN: [id number] Date of Birth: November 22, 1945  spoke with patient. she will discharge to home with family to assist. has rolling walker at home. OPPT set up with [place] [facility]. discharge instructions discussed and questions answered. packet mailed to patient. Patient and MD in agreement with plan. Choice offered.                     DME Arranged:    DME Agency:     HH Arranged:    HH Agency:     Additional Comments: Please contact me with any questions of if this plan should need to change.  Shauna Hugh,  RN,BSN,MHA,CCM  [facility] Orthopaedic Specialist  [phone] 05/27/2023, 11:15 AM

## 2023-05-28 ENCOUNTER — Encounter: Attending: Physical Medicine & Rehabilitation | Admitting: Physical Medicine & Rehabilitation

## 2023-05-28 ENCOUNTER — Encounter: Payer: Self-pay | Admitting: Physical Medicine & Rehabilitation

## 2023-05-28 VITALS — BP 113/71 | HR 82 | Ht 59.0 in | Wt 148.0 lb

## 2023-05-28 DIAGNOSIS — M546 Pain in thoracic spine: Secondary | ICD-10-CM | POA: Diagnosis not present

## 2023-05-28 DIAGNOSIS — G800 Spastic quadriplegic cerebral palsy: Secondary | ICD-10-CM | POA: Insufficient documentation

## 2023-05-28 DIAGNOSIS — M5459 Other low back pain: Secondary | ICD-10-CM | POA: Diagnosis not present

## 2023-05-28 DIAGNOSIS — G809 Cerebral palsy, unspecified: Secondary | ICD-10-CM | POA: Diagnosis not present

## 2023-05-28 DIAGNOSIS — M4140 Neuromuscular scoliosis, site unspecified: Secondary | ICD-10-CM | POA: Diagnosis not present

## 2023-05-28 MED ORDER — ONABOTULINUMTOXINA 100 UNITS IJ SOLR
100.0000 [IU] | Freq: Once | INTRAMUSCULAR | Status: AC
Start: 2023-05-28 — End: ?

## 2023-05-28 MED ORDER — SODIUM CHLORIDE (PF) 0.9 % IJ SOLN
2.0000 mL | Freq: Once | INTRAMUSCULAR | Status: AC
Start: 2023-05-28 — End: ?

## 2023-05-28 NOTE — Progress Notes (Signed)
 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  She is here for spasticity management primarily left upper extremity.  She has undergone botulinum toxin injection under EMG guidance on 02/17/2023 of the following muscle groups  LEFT total dose 100U   FDS 25 FPL 25 Biceps 25  Pronator teres 25  Patient had some weakness of the thumb flexor muscle as well as index finger muscle starting sometime after the injection and does not feel like this has returned to her baseline yet. We discussed that it is difficult to predict how much longer her finger flexor weakness will persist but would likely only be a matter of weeks.  She is to call the office once she starts getting a pinch back in her left hand and we would repeat with the following dosing Left FDS 25 focusing on the third fourth and fifth digits with e-stim FPL 0 units Biceps 25 units Pronator teres 50 units

## 2023-06-01 DIAGNOSIS — G809 Cerebral palsy, unspecified: Secondary | ICD-10-CM | POA: Diagnosis not present

## 2023-06-01 DIAGNOSIS — M4140 Neuromuscular scoliosis, site unspecified: Secondary | ICD-10-CM | POA: Diagnosis not present

## 2023-06-01 DIAGNOSIS — M5459 Other low back pain: Secondary | ICD-10-CM | POA: Diagnosis not present

## 2023-06-01 DIAGNOSIS — M546 Pain in thoracic spine: Secondary | ICD-10-CM | POA: Diagnosis not present

## 2023-06-03 NOTE — Patient Instructions (Signed)
 SURGICAL WAITING ROOM VISITATION  Patients having surgery or a procedure may have no more than 2 support people in the waiting area - these visitors may rotate.    Children under the age of 98 must have an adult with them who is not the patient.  Due to an increase in RSV and influenza rates and associated hospitalizations, children ages 73 and under may not visit patients in [facility] hospitals.  Visitors with respiratory illnesses are discouraged from visiting and should remain at home.  If the patient needs to stay at the hospital during part of their recovery, the visitor guidelines for inpatient rooms apply. Pre-op nurse will coordinate an appropriate time for 1 support person to accompany patient in pre-op.  This support person may not rotate.    Please refer to the [facility] website for the visitor guidelines for Inpatients (after your surgery is over and you are in a regular room).    Your procedure is scheduled on: 06/09/23   Report to [facility] Main Entrance    Report to admitting at 5:15 AM   Call this number if you have problems the morning of surgery [phone]   Do not eat food :After Midnight.   After Midnight you may have the following liquids until 4:30 AM DAY OF SURGERY  Water Non-Citrus Juices (without pulp, NO RED-Apple, White grape, White cranberry) Black Coffee (NO MILK/CREAM OR CREAMERS, sugar ok)  Clear Tea (NO MILK/CREAM OR CREAMERS, sugar ok) regular and decaf                             Plain Jell-O (NO RED)                                           Fruit ices (not with fruit pulp, NO RED)                                     Popsicles (NO RED)                                                               Sports drinks like Gatorade (NO RED)     The day of surgery:  Drink ONE (1) Pre-Surgery Clear Ensure at 4:30 AM the morning of surgery. Drink in one sitting. Do not sip.  This drink was given to you during your hospital  pre-op  appointment visit. Nothing else to drink after completing the  Pre-Surgery Clear Ensure          If you have questions, please contact your surgeon's office.   FOLLOW BOWEL PREP AND ANY ADDITIONAL PRE OP INSTRUCTIONS YOU RECEIVED FROM YOUR SURGEON'S OFFICE!!!     Oral Hygiene is also important to reduce your risk of infection.                                    Remember - BRUSH YOUR TEETH THE MORNING OF SURGERY WITH YOUR REGULAR TOOTHPASTE  DENTURES WILL BE REMOVED PRIOR TO SURGERY PLEASE DO NOT APPLY "Poly grip" OR ADHESIVES!!!   Stop all vitamins and herbal supplements 7 days before surgery.   Take these medicines the morning of surgery with A SIP OF WATER: Levothyroxine, Metoprolol             You may not have any metal on your body including hair pins, jewelry, and body piercing             Do not wear make-up, lotions, powders, perfumes, or deodorant  Do not wear nail polish including gel and S&S, artificial/acrylic nails, or any other type of covering on natural nails including finger and toenails. If you have artificial nails, gel coating, etc. that needs to be removed by a nail salon please have this removed prior to surgery or surgery may need to be canceled/ delayed if the surgeon/ anesthesia feels like they are unable to be safely monitored.   Do not shave  48 hours prior to surgery.    Do not bring valuables to the hospital. [place] IS NOT             RESPONSIBLE   FOR VALUABLES.   Contacts, glasses, dentures or bridgework may not be worn into surgery.  DO NOT BRING YOUR HOME MEDICATIONS TO THE HOSPITAL. PHARMACY WILL DISPENSE MEDICATIONS LISTED ON YOUR MEDICATION LIST TO YOU DURING YOUR ADMISSION IN THE HOSPITAL!    Patients discharged on the day of surgery will not be allowed to drive home.  Someone NEEDS to stay with you for the first 24 hours after anesthesia.              Please read over the following fact sheets you were given: IF YOU HAVE QUESTIONS ABOUT  YOUR PRE-OP INSTRUCTIONS PLEASE CALL [phone] [place]    If you received a COVID test during your pre-op visit  it is requested that you wear a mask when out in public, stay away from anyone that may not be feeling well and notify your surgeon if you develop symptoms. If you test positive for Covid or have been in contact with anyone that has tested positive in the last 10 days please notify you surgeon.      Pre-operative 5 CHG Bath Instructions   You can play a key role in reducing the risk of infection after surgery. Your skin needs to be as free of germs as possible. You can reduce the number of germs on your skin by washing with CHG (chlorhexidine gluconate) soap before surgery. CHG is an antiseptic soap that kills germs and continues to kill germs even after washing.   DO NOT use if you have an allergy to chlorhexidine/CHG or antibacterial soaps. If your skin becomes reddened or irritated, stop using the CHG and notify one of our RNs at [phone].   Please shower with the CHG soap starting 4 days before surgery using the following schedule:     Please keep in mind the following:  DO NOT shave, including legs and underarms, starting the day of your first shower.   You may shave your face at any point before/day of surgery.  Place clean sheets on your bed the day you start using CHG soap. Use a clean washcloth (not used since being washed) for each shower. DO NOT sleep with pets once you start using the CHG.   CHG Shower Instructions:  If you choose to wash your hair and private area, wash first with your normal  shampoo/soap.  After you use shampoo/soap, rinse your hair and body thoroughly to remove shampoo/soap residue.  Turn the water OFF and apply about 3 tablespoons (45 ml) of CHG soap to a CLEAN washcloth.  Apply CHG soap ONLY FROM YOUR NECK DOWN TO YOUR TOES (washing for 3-5 minutes)  DO NOT use CHG soap on face, private areas, open wounds, or sores.  Pay special  attention to the area where your surgery is being performed.  If you are having back surgery, having someone wash your back for you may be helpful. Wait 2 minutes after CHG soap is applied, then you may rinse off the CHG soap.  Pat dry with a clean towel  Put on clean clothes/pajamas   If you choose to wear lotion, please use ONLY the CHG-compatible lotions on the back of this paper.     Additional instructions for the day of surgery: DO NOT APPLY any lotions, deodorants, cologne, or perfumes.   Put on clean/comfortable clothes.  Brush your teeth.  Ask your nurse before applying any prescription medications to the skin.      CHG Compatible Lotions   Aveeno Moisturizing lotion  Cetaphil Moisturizing Cream  Cetaphil Moisturizing Lotion  Clairol Herbal Essence Moisturizing Lotion, Dry Skin  Clairol Herbal Essence Moisturizing Lotion, Extra Dry Skin  Clairol Herbal Essence Moisturizing Lotion, Normal Skin  Curel Age Defying Therapeutic Moisturizing Lotion with Alpha Hydroxy  Curel Extreme Care Body Lotion  Curel Soothing Hands Moisturizing Hand Lotion  Curel Therapeutic Moisturizing Cream, Fragrance-Free  Curel Therapeutic Moisturizing Lotion, Fragrance-Free  Curel Therapeutic Moisturizing Lotion, Original Formula  Eucerin Daily Replenishing Lotion  Eucerin Dry Skin Therapy Plus Alpha Hydroxy Crme  Eucerin Dry Skin Therapy Plus Alpha Hydroxy Lotion  Eucerin Original Crme  Eucerin Original Lotion  Eucerin Plus Crme Eucerin Plus Lotion  Eucerin TriLipid Replenishing Lotion  Keri Anti-Bacterial Hand Lotion  Keri Deep Conditioning Original Lotion Dry Skin Formula Softly Scented  Keri Deep Conditioning Original Lotion, Fragrance Free Sensitive Skin Formula  Keri Lotion Fast Absorbing Fragrance Free Sensitive Skin Formula  Keri Lotion Fast Absorbing Softly Scented Dry Skin Formula  Keri Original Lotion  Keri Skin Renewal Lotion Keri Silky Smooth Lotion  Keri Silky Smooth  Sensitive Skin Lotion  Nivea Body Creamy Conditioning Oil  Nivea Body Extra Enriched Lotion  Nivea Body Original Lotion  Nivea Body Sheer Moisturizing Lotion Nivea Crme  Nivea Skin Firming Lotion  NutraDerm 30 Skin Lotion  NutraDerm Skin Lotion  NutraDerm Therapeutic Skin Cream  NutraDerm Therapeutic Skin Lotion  ProShield Protective Hand Cream  Provon moisturizing lotion   View Pre-Surgery Education Videos:  [email or web address]     Incentive Spirometer  An incentive spirometer is a tool that can help keep your lungs clear and active. This tool measures how well you are filling your lungs with each breath. Taking long deep breaths may help reverse or decrease the chance of developing breathing (pulmonary) problems (especially infection) following: A long period of time when you are unable to move or be active. BEFORE THE PROCEDURE  If the spirometer includes an indicator to show your best effort, your nurse or respiratory therapist will set it to a desired goal. If possible, sit up straight or lean slightly forward. Try not to slouch. Hold the incentive spirometer in an upright position. INSTRUCTIONS FOR USE  Sit on the edge of your bed if possible, or sit up as far as you can in bed or on a chair. Hold the incentive spirometer in an  upright position. Breathe out normally. Place the mouthpiece in your mouth and seal your lips tightly around it. Breathe in slowly and as deeply as possible, raising the piston or the ball toward the top of the column. Hold your breath for 3-5 seconds or for as long as possible. Allow the piston or ball to fall to the bottom of the column. Remove the mouthpiece from your mouth and breathe out normally. Rest for a few seconds and repeat Steps 1 through 7 at least 10 times every 1-2 hours when you are awake. Take your time and take a few normal breaths between deep breaths. The spirometer may  include an indicator to show your best effort. Use the indicator as a goal to work toward during each repetition. After each set of 10 deep breaths, practice coughing to be sure your lungs are clear. If you have an incision (the cut made at the time of surgery), support your incision when coughing by placing a pillow or rolled up towels firmly against it. Once you are able to get out of bed, walk around indoors and cough well. You may stop using the incentive spirometer when instructed by your caregiver.  RISKS AND COMPLICATIONS Take your time so you do not get dizzy or light-headed. If you are in pain, you may need to take or ask for pain medication before doing incentive spirometry. It is harder to take a deep breath if you are having pain. AFTER USE Rest and breathe slowly and easily. It can be helpful to keep track of a log of your progress. Your caregiver can provide you with a simple table to help with this. If you are using the spirometer at home, follow these instructions: SEEK MEDICAL CARE IF:  You are having difficultly using the spirometer. You have trouble using the spirometer as often as instructed. Your pain medication is not giving enough relief while using the spirometer. You develop fever of 100.5 F (38.1 C) or higher. SEEK IMMEDIATE MEDICAL CARE IF:  You cough up bloody sputum that had not been present before. You develop fever of 102 F (38.9 C) or greater. You develop worsening pain at or near the incision site. MAKE SURE YOU:  Understand these instructions. Will watch your condition. Will get help right away if you are not doing well or get worse. Document Released: 06/30/2006 Document Revised: 05/12/2011 Document Reviewed: 08/31/2006 [facility] Patient Information 2014 [place], [place].   ________________________________________________________________________

## 2023-06-03 NOTE — Progress Notes (Signed)
 COVID Vaccine Completed: yes  Date of COVID positive in last 90 days:  PCP - Evie Lacks, MD Cardiologist - Epifanio Lesches, MD Electrophysiologist- Steffanie Dunn, MD  Cardiac clearance by Dr. Bjorn Pippin 02/27/23 in Epic   Chest x-ray - 10/30/22 Epic EKG - 10/30/22 Epic Stress Test - n/a ECHO - 09/24/22 Epic Cardiac Cath - n/a Pacemaker/ICD device last checked: n/a Spinal Cord Stimulator: yes, removed per pt  Bowel Prep - no  Sleep Study - n/a CPAP -   Fasting Blood Sugar - n/a Checks Blood Sugar _____ times a day  Last dose of GLP1 agonist-  N/A GLP1 instructions:  Hold 7 days before surgery    Last dose of SGLT-2 inhibitors-  N/A SGLT-2 instructions:  Hold 3 days before surgery    Blood Thinner Instructions:  Last dose: n/a Time: Aspirin Instructions: Last Dose:  Activity level: Can perform activities of daily living without stopping and without symptoms of chest pain or shortness of breath. No stairs. Patient ambulates with Rolator   Anesthesia review: a fib, cerebral palsy, watchman left atrial appendage  Patient denies shortness of breath, fever, cough and chest pain at PAT appointment  Patient verbalized understanding of instructions that were given to them at the PAT appointment. Patient was also instructed that they will need to review over the PAT instructions again at home before surgery.

## 2023-06-04 ENCOUNTER — Encounter (HOSPITAL_COMMUNITY)
Admission: RE | Admit: 2023-06-04 | Discharge: 2023-06-04 | Disposition: A | Discharge status: Home / Self Care | Source: Ambulatory Visit | Attending: Orthopaedic Surgery | Admitting: Orthopaedic Surgery

## 2023-06-04 ENCOUNTER — Other Ambulatory Visit: Payer: Self-pay

## 2023-06-04 ENCOUNTER — Encounter (HOSPITAL_COMMUNITY): Payer: Self-pay

## 2023-06-04 VITALS — BP 121/67 | HR 77 | Temp 97.9°F | Resp 12 | Ht 59.0 in | Wt 147.0 lb

## 2023-06-04 DIAGNOSIS — I482 Chronic atrial fibrillation, unspecified: Secondary | ICD-10-CM | POA: Diagnosis not present

## 2023-06-04 DIAGNOSIS — M1711 Unilateral primary osteoarthritis, right knee: Secondary | ICD-10-CM | POA: Diagnosis not present

## 2023-06-04 DIAGNOSIS — G809 Cerebral palsy, unspecified: Secondary | ICD-10-CM | POA: Diagnosis not present

## 2023-06-04 DIAGNOSIS — Z01812 Encounter for preprocedural laboratory examination: Secondary | ICD-10-CM | POA: Diagnosis not present

## 2023-06-04 DIAGNOSIS — Z7902 Long term (current) use of antithrombotics/antiplatelets: Secondary | ICD-10-CM | POA: Insufficient documentation

## 2023-06-04 DIAGNOSIS — Z01818 Encounter for other preprocedural examination: Secondary | ICD-10-CM

## 2023-06-04 HISTORY — DX: Unspecified atrial fibrillation: I48.91

## 2023-06-04 HISTORY — DX: Unspecified osteoarthritis, unspecified site: M19.90

## 2023-06-04 LAB — CBC
HCT: 39.7 % (ref 36.0–46.0)
Hemoglobin: 12.6 g/dL (ref 12.0–15.0)
MCH: 30.4 pg (ref 26.0–34.0)
MCHC: 31.7 g/dL (ref 30.0–36.0)
MCV: 95.9 fL (ref 80.0–100.0)
Platelets: 277 10*3/uL (ref 150–400)
RBC: 4.14 MIL/uL (ref 3.87–5.11)
RDW: 13.5 % (ref 11.5–15.5)
WBC: 7.5 10*3/uL (ref 4.0–10.5)
nRBC: 0 % (ref 0.0–0.2)

## 2023-06-04 LAB — SURGICAL PCR SCREEN
MRSA, PCR: NEGATIVE
Staphylococcus aureus: POSITIVE — AB

## 2023-06-04 LAB — BASIC METABOLIC PANEL WITH GFR
Anion gap: 11 (ref 5–15)
BUN: 15 mg/dL (ref 8–23)
CO2: 25 mmol/L (ref 22–32)
Calcium: 9.4 mg/dL (ref 8.9–10.3)
Chloride: 107 mmol/L (ref 98–111)
Creatinine, Ser: 0.65 mg/dL (ref 0.44–1.00)
GFR, Estimated: >60 mL/min (ref >60)
Glucose, Bld: 93 mg/dL (ref 70–99)
Potassium: 4.3 mmol/L (ref 3.5–5.1)
Sodium: 143 mmol/L (ref 135–145)

## 2023-06-05 DIAGNOSIS — M4140 Neuromuscular scoliosis, site unspecified: Secondary | ICD-10-CM | POA: Diagnosis not present

## 2023-06-05 DIAGNOSIS — G809 Cerebral palsy, unspecified: Secondary | ICD-10-CM | POA: Diagnosis not present

## 2023-06-05 DIAGNOSIS — M5459 Other low back pain: Secondary | ICD-10-CM | POA: Diagnosis not present

## 2023-06-05 DIAGNOSIS — M546 Pain in thoracic spine: Secondary | ICD-10-CM | POA: Diagnosis not present

## 2023-06-05 NOTE — Progress Notes (Signed)
[facility]+ results routed to Dr. Dalldorf 

## 2023-06-05 NOTE — Anesthesia Preprocedure Evaluation (Addendum)
 Anesthesia Evaluation  Patient identified by MRN, date of birth, ID band Patient awake    Reviewed: Allergy & Precautions, NPO status , Patient's Chart, lab work & pertinent test results, reviewed documented beta blocker date and time   Airway Mallampati: II  TM Distance: >3 FB Neck ROM: Full    Dental  (+) Teeth Intact, Dental Advisory Given   Pulmonary neg pulmonary ROS   Pulmonary exam normal breath sounds clear to auscultation       Cardiovascular hypertension, Pt. on home beta blockers Normal cardiovascular exam+ dysrhythmias (Presence of Watchman left atrial appendage closure device) Atrial Fibrillation  Rhythm:Regular Rate:Normal     Neuro/Psych  Headaches CP  Neuromuscular disease  negative psych ROS   GI/Hepatic negative GI ROS, Neg liver ROS,,,  Endo/Other  Hypothyroidism    Renal/GU negative Renal ROS     Musculoskeletal  (+) Arthritis , Osteoarthritis,    Abdominal   Peds  Hematology negative hematology ROS (+) Plt 277k   Anesthesia Other Findings   Reproductive/Obstetrics                             Anesthesia Physical Anesthesia Plan  ASA: 3  Anesthesia Plan: Spinal   Post-op Pain Management: Tylenol PO (pre-op)* and Regional block*   Induction:   PONV Risk Score and Plan: 2 and TIVA, Dexamethasone and Ondansetron  Airway Management Planned: Natural Airway and Simple Face Mask  Additional Equipment:   Intra-op Plan:   Post-operative Plan:   Informed Consent: I have reviewed the patients History and Physical, chart, labs and discussed the procedure including the risks, benefits and alternatives for the proposed anesthesia with the patient or authorized representative who has indicated his/her understanding and acceptance.       Plan Discussed with: CRNA  Anesthesia Plan Comments: (See PAT note 06/04/2023)       Anesthesia Quick Evaluation

## 2023-06-05 NOTE — Progress Notes (Signed)
 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 never smoker with h/o atrial fibrillation with Watchman device in place, scoliosis, cerebral palsy, right knee djd scheduled for above procedure 06/09/2023 with Dr. Marcene Corning.   H/o spinal cord stimulator, pt reports removed.   Per cardiology preoperative evaluation 02/27/2024, "Preop evaluation: Prior to knee surgery.  Functional capacity greater than 4 METS.  Denies any exertional symptoms.  RCRI score 0.  Overall would classify as low risk for intermediate risk surgery, no further cardiac workup recommended prior to procedure.  She is on Plavix for 6 months from her watchman, she will need to wait until she can come off her Plavix for the procedure, planning to stop Plavix 05/01/23 "  Pt stopped Plavix 05/01/23.   VS: BP 121/67   Pulse 77   Temp 36.6 C (Oral)   Resp 12   Ht 4\' 11"  (1.499 m)   Wt 66.7 kg   SpO2 95%   BMI 29.69 kg/m   PROVIDERS: Gweneth Dimitri, MD is PCP   Cardiologist - Epifanio Lesches, MD  Electrophysiologist- Steffanie Dunn, MD LABS: Labs reviewed: Acceptable for surgery. (all labs ordered are listed, but only abnormal results are displayed)  Labs Reviewed  SURGICAL PCR SCREEN - Abnormal; Notable for the following components:      Result Value   Staphylococcus aureus POSITIVE (*)    All other components within normal limits  BASIC METABOLIC PANEL WITH GFR  CBC     IMAGES:   EKG:   CV: Echo 10/30/2022 1. Prior to procedure a small left atrial appendage with two distal lobes  was patent.      Maximal landing zone width 1.97 cm. Maximal wall distant 1.95  cm.      A mid-mid transeptal puncture was performed without incident.      Estimated left atrial pressure via [place]'s Formular 14 mm Hg.      A 27 mm Watchman FLX pro was placed in the left atrial appendage.      Average compression 15%. No peri-device leak. 1/3 Mitral shoulder.  Successful Watchman placement.      residula left to right shunt. No change in pericardial space post  procedure. Left atrial size was severely dilated. No left atrial/left  atrial appendage thrombus was detected.   2. Left ventricular ejection fraction, by estimation, is 60 to 65%. The  left ventricle has normal function.   3. Right ventricular systolic function is normal. The right ventricular  size is normal.   4. Right atrial size was mild to moderately dilated.   5. The mitral valve is grossly normal. Trivial mitral valve  regurgitation. No evidence of mitral stenosis.   6. The aortic valve is tricuspid. Aortic valve regurgitation is not  visualized. No aortic stenosis is present.   7. Cannot exclude a small PFO.  Past Medical History:  Diagnosis Date   Arthritis    Atrial fibrillation (HCC)    Cerebral palsy (HCC)    Mild symptoms.   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 Lalla Brothers  Past Surgical History:  Procedure Laterality Date   ATRIAL FIBRILLATION ABLATION N/A 08/12/2022   Procedure: ATRIAL FIBRILLATION ABLATION;  Surgeon: Lanier Prude, MD;  Location: [facility] INVASIVE CV LAB;  Service: Cardiovascular;  Laterality: N/A;   CHOLECYSTECTOMY  1989   LEFT ATRIAL APPENDAGE OCCLUSION N/A 10/30/2022   Procedure: LEFT ATRIAL APPENDAGE OCCLUSION;  Surgeon: Lanier Prude, MD;  Location: [facility] INVASIVE CV LAB;  Service: Cardiovascular;  Laterality: N/A;   minute man     in back per pt, screw in spine   TEE WITHOUT CARDIOVERSION N/A 08/12/2022   Procedure: TRANSESOPHAGEAL ECHOCARDIOGRAM;  Surgeon: Lanier Prude,  MD;  [facility] INVASIVE CV LAB;  Service: Cardiovascular;  Laterality: N/A;   TEE WITHOUT CARDIOVERSION N/A 10/30/2022   Procedure: TRANSESOPHAGEAL ECHOCARDIOGRAM;  Surgeon: Lanier Prude, MD;  Location: [facility] INVASIVE CV LAB;  Service: Cardiovascular;  Laterality: N/A;   TEMPOROMANDIBULAR JOINT ARTHROPLASTY     Thyroid nodule resection  2005   TOTAL ABDOMINAL HYSTERECTOMY W/ BILATERAL SALPINGOOPHORECTOMY  1982    MEDICATIONS:  atorvastatin (LIPITOR) 20 MG tablet   Calcium Carbonate-Vit D-Min (CALCIUM 1200 PO)   estradiol (ESTRACE) 0.1 MG/GM vaginal cream   ibandronate (BONIVA) 150 MG tablet   levothyroxine (SYNTHROID, LEVOTHROID) 75 MCG tablet   MAGNESIUM PO   metoprolol succinate (TOPROL XL) 25 MG 24 hr tablet   Multiple Vitamin (MULTIVITAMIN WITH MINERALS) TABS tablet   naproxen sodium (ALEVE) 220 MG tablet   Polyethyl Glycol-Propyl Glycol (SYSTANE) 0.4-0.3 % GEL ophthalmic gel   Polyethyl Glycol-Propyl Glycol (SYSTANE) 0.4-0.3 % SOLN   SUMAtriptan (IMITREX) 100 MG tablet    botulinum toxin Type A (BOTOX) injection 100 Units   sodium chloride (PF) 0.9 % injection 2 mL     [facility], PA-C [facility] Pre-Surgical Testing [phone]

## 2023-06-08 DIAGNOSIS — M546 Pain in thoracic spine: Secondary | ICD-10-CM | POA: Diagnosis not present

## 2023-06-08 DIAGNOSIS — M5459 Other low back pain: Secondary | ICD-10-CM | POA: Diagnosis not present

## 2023-06-08 DIAGNOSIS — G809 Cerebral palsy, unspecified: Secondary | ICD-10-CM | POA: Diagnosis not present

## 2023-06-08 DIAGNOSIS — M4140 Neuromuscular scoliosis, site unspecified: Secondary | ICD-10-CM | POA: Diagnosis not present

## 2023-06-08 NOTE — H&P (Signed)
 TOTAL KNEE ADMISSION H&P  Patient is being admitted for right total knee arthroplasty.  Subjective:  Chief Complaint:right knee pain.  HPI: Holly Mcconnell, 78 y.o. female, has a history of pain and functional disability in the right knee due to arthritis and has failed non-surgical conservative treatments for greater than 12 weeks to includeNSAID's and/or analgesics, corticosteriod injections, viscosupplementation injections, flexibility and strengthening excercises, use of assistive devices, weight reduction as appropriate, and activity modification.  Onset of symptoms was gradual, starting 5 years ago with gradually worsening course since that time. The patient noted no past surgery on the right knee(s).  Patient currently rates pain in the right knee(s) at 10 out of 10 with activity. Patient has night pain, worsening of pain with activity and weight bearing, pain that interferes with activities of daily living, crepitus, and joint swelling.  Patient has evidence of subchondral cysts, subchondral sclerosis, periarticular osteophytes, and joint space narrowing by imaging studies. There is no active infection.  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 Lalla Brothers    Past Surgical History:  Procedure Laterality Date   ATRIAL FIBRILLATION ABLATION N/A 08/12/2022   Procedure: ATRIAL FIBRILLATION ABLATION;  Surgeon: Lanier Prude, MD;  Location: [facility] INVASIVE CV LAB;  Service: Cardiovascular;  Laterality: N/A;   CHOLECYSTECTOMY  1989   LEFT ATRIAL APPENDAGE OCCLUSION N/A 10/30/2022   Procedure: LEFT ATRIAL APPENDAGE OCCLUSION;  Surgeon: Lanier Prude, MD;  Location: [facility] INVASIVE CV LAB;  Service: Cardiovascular;  Laterality: N/A;   minute man     in back per pt, screw in spine   TEE WITHOUT CARDIOVERSION N/A 08/12/2022   Procedure: TRANSESOPHAGEAL ECHOCARDIOGRAM;  Surgeon: Lanier Prude, MD;  Location: [facility] INVASIVE CV LAB;  Service: Cardiovascular;  Laterality: N/A;   TEE WITHOUT CARDIOVERSION N/A 10/30/2022   Procedure: TRANSESOPHAGEAL ECHOCARDIOGRAM;  Surgeon: Lanier Prude, MD;  Location: [facility] INVASIVE CV LAB;  Service: Cardiovascular;  Laterality: N/A;   TEMPOROMANDIBULAR JOINT ARTHROPLASTY     Thyroid nodule resection  2005   TOTAL ABDOMINAL HYSTERECTOMY W/ BILATERAL SALPINGOOPHORECTOMY  1982    Current Facility-Administered Medications  Medication Dose Route Frequency Provider Last Rate Last Admin   botulinum toxin Type A (BOTOX) injection 100 Units  100 Units Intramuscular Once        sodium chloride (PF) 0.9 % injection 2 mL  2 mL Intravenous Once        Current Outpatient Medications  Medication Sig Dispense Refill Last Dose/Taking   atorvastatin (LIPITOR) 20 MG tablet Take 20 mg by mouth at bedtime.   Taking   Calcium Carbonate-Vit D-Min (CALCIUM 1200 PO) Take  1,200 mg by mouth every evening.   Taking   estradiol (ESTRACE) 0.1 MG/GM vaginal cream Place 1 Applicatorful vaginally 3 (three) times a week.   Taking   ibandronate (BONIVA) 150 MG tablet Take 150  mg by mouth every 30 (thirty) days.   Taking   levothyroxine (SYNTHROID, LEVOTHROID) 75 MCG tablet Take 37.5-75 mcg by mouth See admin instructions. Take 0.5 tablet (37.5 mcg) by mouth in the morning on Sundays.  Take 1 tablet (75 mcg) by mouth once daily in the morning on all other days.  1 Taking   MAGNESIUM PO Take 250 mg by mouth at bedtime.   Taking   metoprolol succinate (TOPROL XL) 25 MG 24 hr tablet Take 1 tablet (25 mg total) by mouth daily. 90 tablet 3 Taking   Multiple Vitamin (MULTIVITAMIN WITH MINERALS) TABS tablet Take 1 tablet by mouth every evening.   Taking   naproxen sodium (ALEVE) 220 MG tablet Take 220 mg by mouth daily as needed (pain).   Taking As Needed   Polyethyl Glycol-Propyl Glycol (SYSTANE) 0.4-0.3 % GEL ophthalmic gel Place 1 Application into both eyes at bedtime.   Taking   Polyethyl Glycol-Propyl Glycol (SYSTANE) 0.4-0.3 % SOLN Place 1 drop into both eyes in the morning and at bedtime.   Taking   SUMAtriptan (IMITREX) 100 MG tablet Take 100 mg by mouth every 2 (two) hours as needed for migraine. May repeat in 2 hours if headache persists or recurs.   Taking As Needed   Allergies  Allergen Reactions   Clavulanic Acid Other (See Comments)   Nitrofurantoin Other (See Comments), Diarrhea and Nausea Only    Other Reaction(s): nausea and diarrhea   Amoxicillin Diarrhea   Nabumetone Diarrhea and Nausea And Vomiting   Other Other (See Comments)    Difficulty waking from anesthesia    Sulfa Antibiotics Diarrhea and Nausea And Vomiting   Codeine Diarrhea    Social History   Tobacco Use   Smoking status: Never   Smokeless tobacco: Never  Substance Use Topics   Alcohol use: Never    Family History  Problem Relation Age of Onset   Breast cancer Maternal Grandmother    Colon cancer Maternal Grandmother    Atrial fibrillation Mother 97       Status post pacemaker   Hypertension Mother    Stroke Mother    Hypercholesterolemia Mother    COPD Mother     Hypothyroidism Mother    CAD Mother        Diagnosed late   Atrial fibrillation Maternal Grandfather        Dilated age 3   Atrial fibrillation Maternal Uncle    Hodgkin's lymphoma Father        Died @ 15   Lung cancer Father    Pancreatic cancer Sister    Hypertension Sister    Hyperlipidemia Sister      Review of Systems  Musculoskeletal:  Positive for arthralgias.       Right knee  All other systems reviewed and are negative.   Objective:  Physical Exam Constitutional:      Appearance: Normal appearance.  HENT:     Head: Normocephalic and atraumatic.     Nose: Nose normal.     Mouth/Throat:     Mouth: Mucous membranes are moist.     Pharynx: Oropharynx is clear.  Eyes:     Extraocular Movements: Extraocular movements intact.  Pulmonary:     Effort: Pulmonary effort is normal.  Abdominal:     Palpations: Abdomen is soft.  Musculoskeletal:     Cervical back: Normal range of motion.     Comments: Right knee motion is about 0-90.  She has a mild valgus deformity.  She has lateral joint line pain with some crepitation but no effusion.  Hip motion is good and straight leg raise is negative.    Skin:    General: Skin is warm and dry.  Neurological:     General: No focal deficit present.     Mental Status: She is alert and oriented to person, place, and time. Mental status is at baseline.  Psychiatric:        Mood and Affect: Mood normal.        Behavior: Behavior normal.        Thought Content: Thought content normal.        Judgment: Judgment normal.     Vital signs in last 24 hours:    Labs:   Estimated body mass index is 29.69 kg/m as calculated from the following:   Height as of 06/04/23: 4\' 11"  (1.499 m).   Weight as of 06/04/23: 66.7 kg.   Imaging Review Plain radiographs demonstrate severe degenerative joint disease of the right knee(s). The overall alignment isneutral. The bone quality appears to be good for age and reported activity  level.      Assessment/Plan:  End stage primary arthritis, right knee   The patient history, physical examination, clinical judgment of the provider and imaging studies are consistent with end stage degenerative joint disease of the right knee(s) and total knee arthroplasty is deemed medically necessary. The treatment options including medical management, injection therapy arthroscopy and arthroplasty were discussed at length. The risks and benefits of total knee arthroplasty were presented and reviewed. The risks due to aseptic loosening, infection, stiffness, patella tracking problems, thromboembolic complications and other imponderables were discussed. The patient acknowledged the explanation, agreed to proceed with the plan and consent was signed. Patient is being admitted for inpatient treatment for surgery, pain control, PT, OT, prophylactic antibiotics, VTE prophylaxis, progressive ambulation and ADL's and discharge planning. The patient is planning to be discharged home with home health services   Patient's anticipated LOS is less than 2 midnights, meeting these requirements: - Younger than 62 - Lives within 1 hour of care - Has a competent adult at home to recover with post-op recover - NO history of  - Chronic pain requiring opiods  - Diabetes  - Coronary Artery Disease  - Heart failure  - Heart attack  - Stroke  - DVT/VTE  - Cardiac arrhythmia  - Respiratory Failure/COPD  - Renal failure  - Anemia  - Advanced Liver disease

## 2023-06-09 ENCOUNTER — Ambulatory Visit (HOSPITAL_COMMUNITY)
Admission: RE | Admit: 2023-06-09 | Discharge: 2023-06-09 | Disposition: A | Discharge status: Home / Self Care | Attending: Orthopaedic Surgery | Admitting: Orthopaedic Surgery

## 2023-06-09 ENCOUNTER — Ambulatory Visit (HOSPITAL_COMMUNITY): Payer: Self-pay | Admitting: Physician Assistant

## 2023-06-09 ENCOUNTER — Encounter (HOSPITAL_COMMUNITY): Payer: Self-pay | Admitting: Orthopaedic Surgery

## 2023-06-09 ENCOUNTER — Other Ambulatory Visit: Payer: Self-pay

## 2023-06-09 ENCOUNTER — Ambulatory Visit (HOSPITAL_COMMUNITY): Payer: Self-pay | Admitting: Registered Nurse

## 2023-06-09 ENCOUNTER — Encounter (HOSPITAL_COMMUNITY)
Admission: RE | Disposition: A | Discharge status: Home / Self Care | Payer: Self-pay | Source: Home / Self Care | Attending: Orthopaedic Surgery

## 2023-06-09 DIAGNOSIS — G709 Myoneural disorder, unspecified: Secondary | ICD-10-CM | POA: Diagnosis not present

## 2023-06-09 DIAGNOSIS — M1990 Unspecified osteoarthritis, unspecified site: Secondary | ICD-10-CM | POA: Diagnosis not present

## 2023-06-09 DIAGNOSIS — I4891 Unspecified atrial fibrillation: Secondary | ICD-10-CM | POA: Diagnosis not present

## 2023-06-09 DIAGNOSIS — Z79899 Other long term (current) drug therapy: Secondary | ICD-10-CM | POA: Diagnosis not present

## 2023-06-09 DIAGNOSIS — Z8349 Family history of other endocrine, nutritional and metabolic diseases: Secondary | ICD-10-CM | POA: Insufficient documentation

## 2023-06-09 DIAGNOSIS — Z95818 Presence of other cardiac implants and grafts: Secondary | ICD-10-CM | POA: Diagnosis not present

## 2023-06-09 DIAGNOSIS — Z8249 Family history of ischemic heart disease and other diseases of the circulatory system: Secondary | ICD-10-CM | POA: Insufficient documentation

## 2023-06-09 DIAGNOSIS — Z96651 Presence of right artificial knee joint: Secondary | ICD-10-CM | POA: Diagnosis not present

## 2023-06-09 DIAGNOSIS — I480 Paroxysmal atrial fibrillation: Secondary | ICD-10-CM | POA: Insufficient documentation

## 2023-06-09 DIAGNOSIS — M1711 Unilateral primary osteoarthritis, right knee: Secondary | ICD-10-CM | POA: Diagnosis not present

## 2023-06-09 DIAGNOSIS — E039 Hypothyroidism, unspecified: Secondary | ICD-10-CM | POA: Insufficient documentation

## 2023-06-09 DIAGNOSIS — I10 Essential (primary) hypertension: Secondary | ICD-10-CM | POA: Diagnosis not present

## 2023-06-09 DIAGNOSIS — G8918 Other acute postprocedural pain: Secondary | ICD-10-CM | POA: Diagnosis not present

## 2023-06-09 HISTORY — PX: TOTAL KNEE ARTHROPLASTY: SHX125

## 2023-06-09 SURGERY — ARTHROPLASTY, KNEE, TOTAL
Anesthesia: Spinal | Site: Knee | Laterality: Right

## 2023-06-09 MED ORDER — LACTATED RINGERS IV BOLUS
250.0000 mL | Freq: Once | INTRAVENOUS | Status: AC
  Administered 2023-06-09: 250 mL via INTRAVENOUS

## 2023-06-09 MED ORDER — SODIUM CHLORIDE 0.9% IV SOLUTION
INTRAVENOUS | Status: DC | PRN
Start: 2023-06-09 — End: 2023-06-09
  Administered 2023-06-09: 1000 mL

## 2023-06-09 MED ORDER — SODIUM CHLORIDE (PF) 0.9 % IJ SOLN
INTRAMUSCULAR | Status: DC | PRN
  Administered 2023-06-09: 30 mL

## 2023-06-09 MED ORDER — ORAL CARE MOUTH RINSE: 15.0000 mL | Freq: Once | OROMUCOSAL | Status: AC

## 2023-06-09 MED ORDER — CHLORHEXIDINE GLUCONATE 0.12 % MT SOLN
15.0000 mL | Freq: Once | OROMUCOSAL | Status: AC
  Administered 2023-06-09: 15 mL via OROMUCOSAL

## 2023-06-09 MED ORDER — HYDROCODONE-ACETAMINOPHEN 5-325 MG PO TABS: 1.0000 | ORAL_TABLET | Freq: Four times a day (QID) | ORAL | 0 refills | Status: DC | PRN

## 2023-06-09 MED ORDER — LACTATED RINGERS IV SOLN: INTRAVENOUS | Status: DC

## 2023-06-09 MED ORDER — TRANEXAMIC ACID 1000 MG/10ML IV SOLN
INTRAVENOUS | Status: DC | PRN
  Administered 2023-06-09: 2000 mg via TOPICAL

## 2023-06-09 MED ORDER — BUPIVACAINE LIPOSOME 1.3 % IJ SUSP
INTRAMUSCULAR | Status: AC
  Filled 2023-06-09: qty 20

## 2023-06-09 MED ORDER — TRANEXAMIC ACID-NACL 1000-0.7 MG/100ML-% IV SOLN
1000.0000 mg | Freq: Once | INTRAVENOUS | Status: AC
  Administered 2023-06-09: 1000 mg via INTRAVENOUS

## 2023-06-09 MED ORDER — BUPIVACAINE LIPOSOME 1.3 % IJ SUSP
INTRAMUSCULAR | Status: DC | PRN
  Administered 2023-06-09: 20 mL

## 2023-06-09 MED ORDER — BUPIVACAINE LIPOSOME 1.3 % IJ SUSP: 20.0000 mL | Freq: Once | INTRAMUSCULAR | Status: DC

## 2023-06-09 MED ORDER — FENTANYL CITRATE (PF) 100 MCG/2ML IJ SOLN
INTRAMUSCULAR | Status: DC | PRN
  Administered 2023-06-09: 25 ug via INTRAVENOUS

## 2023-06-09 MED ORDER — BUPIVACAINE IN DEXTROSE 0.75-8.25 % IT SOLN
INTRATHECAL | Status: DC | PRN
  Administered 2023-06-09: 1.4 mL via INTRATHECAL

## 2023-06-09 MED ORDER — BUPIVACAINE-EPINEPHRINE 0.5% -1:200000 IJ SOLN
INTRAMUSCULAR | Status: DC | PRN
Start: 2023-06-09 — End: 2023-06-09
  Administered 2023-06-09: 30 mL

## 2023-06-09 MED ORDER — ASPIRIN 81 MG PO TBEC: 81.0000 mg | DELAYED_RELEASE_TABLET | Freq: Two times a day (BID) | ORAL | 0 refills | Status: DC

## 2023-06-09 MED ORDER — PROPOFOL 1000 MG/100ML IV EMUL
INTRAVENOUS | Status: AC
  Filled 2023-06-09: qty 100

## 2023-06-09 MED ORDER — TIZANIDINE HCL 2 MG PO TABS: 2.0000 mg | ORAL_TABLET | Freq: Four times a day (QID) | ORAL | 1 refills | Status: DC | PRN

## 2023-06-09 MED ORDER — CLONIDINE HCL (ANALGESIA) 100 MCG/ML EP SOLN
EPIDURAL | Status: DC | PRN
Start: 2023-06-09 — End: 2023-06-09
  Administered 2023-06-09: 50 ug

## 2023-06-09 MED ORDER — CEFAZOLIN SODIUM-DEXTROSE 2-4 GM/100ML-% IV SOLN
2.0000 g | Freq: Four times a day (QID) | INTRAVENOUS | Status: DC
  Administered 2023-06-09: 2 g via INTRAVENOUS

## 2023-06-09 MED ORDER — FENTANYL CITRATE (PF) 100 MCG/2ML IJ SOLN
INTRAMUSCULAR | Status: AC
Start: 2023-06-09 — End: ?
  Filled 2023-06-09: qty 2

## 2023-06-09 MED ORDER — 0.9 % SODIUM CHLORIDE (POUR BTL) OPTIME
TOPICAL | Status: DC | PRN
  Administered 2023-06-09: 1000 mL

## 2023-06-09 MED ORDER — ONDANSETRON HCL 4 MG/2ML IJ SOLN
INTRAMUSCULAR | Status: AC
  Filled 2023-06-09: qty 2

## 2023-06-09 MED ORDER — FENTANYL CITRATE PF 50 MCG/ML IJ SOSY: 25.0000 ug | PREFILLED_SYRINGE | INTRAMUSCULAR | Status: DC | PRN

## 2023-06-09 MED ORDER — MUPIROCIN 2 % EX OINT: 1.0000 | TOPICAL_OINTMENT | Freq: Two times a day (BID) | CUTANEOUS | 0 refills | Status: AC

## 2023-06-09 MED ORDER — METHOCARBAMOL 1000 MG/10ML IJ SOLN: 500.0000 mg | Freq: Four times a day (QID) | INTRAMUSCULAR | Status: DC | PRN

## 2023-06-09 MED ORDER — ONDANSETRON HCL 4 MG/2ML IJ SOLN
INTRAMUSCULAR | Status: DC | PRN
  Administered 2023-06-09: 4 mg via INTRAVENOUS

## 2023-06-09 MED ORDER — PHENYLEPHRINE HCL-NACL 20-0.9 MG/250ML-% IV SOLN
INTRAVENOUS | Status: DC | PRN
  Administered 2023-06-09: 25 ug/min via INTRAVENOUS

## 2023-06-09 MED ORDER — BUPIVACAINE-EPINEPHRINE (PF) 0.5% -1:200000 IJ SOLN
INTRAMUSCULAR | Status: AC
  Filled 2023-06-09: qty 30

## 2023-06-09 MED ORDER — CEFAZOLIN SODIUM-DEXTROSE 2-4 GM/100ML-% IV SOLN
2.0000 g | INTRAVENOUS | Status: AC
  Administered 2023-06-09: 2 g via INTRAVENOUS
  Filled 2023-06-09: qty 100

## 2023-06-09 MED ORDER — CHLORHEXIDINE GLUCONATE 4 % EX SOLN: 1.0000 | CUTANEOUS | 1 refills | Status: DC

## 2023-06-09 MED ORDER — POVIDONE-IODINE 10 % EX SWAB: 2.0000 | Freq: Once | CUTANEOUS | Status: DC

## 2023-06-09 MED ORDER — CEFAZOLIN SODIUM-DEXTROSE 2-4 GM/100ML-% IV SOLN
INTRAVENOUS | Status: AC
  Filled 2023-06-09: qty 100

## 2023-06-09 MED ORDER — TRANEXAMIC ACID 1000 MG/10ML IV SOLN
2000.0000 mg | INTRAVENOUS | Status: DC
  Filled 2023-06-09: qty 20

## 2023-06-09 MED ORDER — DEXAMETHASONE SODIUM PHOSPHATE 10 MG/ML IJ SOLN
INTRAMUSCULAR | Status: DC | PRN
  Administered 2023-06-09: 8 mg via INTRAVENOUS

## 2023-06-09 MED ORDER — ONDANSETRON HCL 4 MG/2ML IJ SOLN: 4.0000 mg | Freq: Once | INTRAMUSCULAR | Status: DC | PRN

## 2023-06-09 MED ORDER — SODIUM CHLORIDE (PF) 0.9 % IJ SOLN
INTRAMUSCULAR | Status: AC
  Filled 2023-06-09: qty 30

## 2023-06-09 MED ORDER — ROPIVACAINE HCL 5 MG/ML IJ SOLN
INTRAMUSCULAR | Status: DC | PRN
  Administered 2023-06-09: 20 mL via PERINEURAL

## 2023-06-09 MED ORDER — TRANEXAMIC ACID-NACL 1000-0.7 MG/100ML-% IV SOLN
INTRAVENOUS | Status: AC
  Filled 2023-06-09: qty 100

## 2023-06-09 MED ORDER — TRANEXAMIC ACID-NACL 1000-0.7 MG/100ML-% IV SOLN
1000.0000 mg | INTRAVENOUS | Status: AC
  Administered 2023-06-09: 1000 mg via INTRAVENOUS
  Filled 2023-06-09: qty 100

## 2023-06-09 MED ORDER — METHOCARBAMOL 500 MG PO TABS: 500.0000 mg | ORAL_TABLET | Freq: Four times a day (QID) | ORAL | Status: DC | PRN

## 2023-06-09 MED ORDER — ACETAMINOPHEN 500 MG PO TABS
1000.0000 mg | ORAL_TABLET | Freq: Once | ORAL | Status: AC
  Administered 2023-06-09: 1000 mg via ORAL
  Filled 2023-06-09: qty 2

## 2023-06-09 MED ORDER — PROPOFOL 500 MG/50ML IV EMUL
INTRAVENOUS | Status: DC | PRN
  Administered 2023-06-09: 30 ug/kg/min via INTRAVENOUS

## 2023-06-09 MED ORDER — DEXAMETHASONE SODIUM PHOSPHATE 10 MG/ML IJ SOLN
INTRAMUSCULAR | Status: AC
  Filled 2023-06-09: qty 1

## 2023-06-09 SURGICAL SUPPLY — 51 items
ATTUNE MED DOME PAT 32 KNEE (Knees) IMPLANT
ATTUNE PSFEM RTSZ4 NARCEM KNEE (Femur) IMPLANT
ATTUNE PSRP INSR SZ4 6 KNEE (Insert) IMPLANT
BAG COUNTER SPONGE SURGICOUNT (BAG) ×1 IMPLANT
BAG DECANTER FOR FLEXI CONT (MISCELLANEOUS) ×1 IMPLANT
BAG ZIPLOCK 12X15 (MISCELLANEOUS) ×1 IMPLANT
BASEPLATE TIBIAL ROTATING SZ 4 (Knees) IMPLANT
BLADE SAGITTAL 25.0X1.19X90 (BLADE) ×1 IMPLANT
BLADE SAW SGTL 11.0X1.19X90.0M (BLADE) ×1 IMPLANT
BLADE SURG SZ10 CARB STEEL (BLADE) ×1 IMPLANT
BNDG ELASTIC 6INX 5YD STR LF (GAUZE/BANDAGES/DRESSINGS) ×1 IMPLANT
BNDG ELASTIC 6X10 VLCR STRL LF (GAUZE/BANDAGES/DRESSINGS) IMPLANT
BOOTIES KNEE HIGH SLOAN (MISCELLANEOUS) ×1 IMPLANT
BOWL SMART MIX CTS (DISPOSABLE) ×1 IMPLANT
CEMENT HV SMART SET (Cement) ×2 IMPLANT
COVER SURGICAL LIGHT HANDLE (MISCELLANEOUS) ×1 IMPLANT
CUFF TRNQT CYL 34X4.125X (TOURNIQUET CUFF) ×1 IMPLANT
DRAPE TOP 10253 STERILE (DRAPES) ×1 IMPLANT
DRAPE U-SHAPE 47X51 STRL (DRAPES) ×1 IMPLANT
DRSG AQUACEL AG ADV 3.5X10 (GAUZE/BANDAGES/DRESSINGS) ×1 IMPLANT
DURAPREP 26ML APPLICATOR (WOUND CARE) ×2 IMPLANT
ELECT PENCIL ROCKER SW 15FT (MISCELLANEOUS) ×1 IMPLANT
ELECT REM PT RETURN 15FT ADLT (MISCELLANEOUS) ×1 IMPLANT
GLOVE BIO SURGEON STRL SZ8 (GLOVE) ×2 IMPLANT
GLOVE BIOGEL PI IND STRL 7.0 (GLOVE) ×1 IMPLANT
GLOVE BIOGEL PI IND STRL 8 (GLOVE) ×2 IMPLANT
GLOVE SURG SYN 7.0 (GLOVE) ×1 IMPLANT
GLOVE SURG SYN 7.0 PF PI (GLOVE) ×1 IMPLANT
GOWN SRG XL LVL 4 BRTHBL STRL (GOWNS) ×1 IMPLANT
GOWN STRL REUS W/ TWL XL LVL3 (GOWN DISPOSABLE) ×2 IMPLANT
HOLDER FOLEY CATH W/STRAP (MISCELLANEOUS) IMPLANT
HOOD PEEL AWAY T7 (MISCELLANEOUS) ×3 IMPLANT
KIT TURNOVER KIT A (KITS) IMPLANT
MANIFOLD NEPTUNE II (INSTRUMENTS) ×1 IMPLANT
NS IRRIG 1000ML POUR BTL (IV SOLUTION) ×1 IMPLANT
PACK TOTAL KNEE CUSTOM (KITS) ×1 IMPLANT
PAD ARMBOARD POSITIONER FOAM (MISCELLANEOUS) ×1 IMPLANT
PIN STEINMAN FIXATION KNEE (PIN) IMPLANT
PROTECTOR NERVE ULNAR (MISCELLANEOUS) ×1 IMPLANT
SET HNDPC FAN SPRY TIP SCT (DISPOSABLE) ×1 IMPLANT
SPIKE FLUID TRANSFER (MISCELLANEOUS) ×2 IMPLANT
SUT ETHIBOND NAB CT1 #1 30IN (SUTURE) ×1 IMPLANT
SUT STRATAFIX 0 PDS 27 VIOLET (SUTURE) ×1 IMPLANT
SUT VIC AB 0 CT1 36 (SUTURE) ×2 IMPLANT
SUT VIC AB 2-0 CT1 TAPERPNT 27 (SUTURE) ×1 IMPLANT
SUT VICRYL AB 3-0 FS1 BRD 27IN (SUTURE) ×1 IMPLANT
SUTURE STRATFX 0 PDS 27 VIOLET (SUTURE) ×1 IMPLANT
TRAY FOLEY MTR SLVR 16FR STAT (SET/KITS/TRAYS/PACK) IMPLANT
WATER STERILE IRR 1000ML POUR (IV SOLUTION) ×2 IMPLANT
WRAP KNEE MAXI GEL POST OP (GAUZE/BANDAGES/DRESSINGS) ×1 IMPLANT
YANKAUER SUCT BULB TIP NO VENT (SUCTIONS) ×1 IMPLANT

## 2023-06-09 NOTE — Progress Notes (Signed)
 Physical Therapy Treatment Patient Details Name: Holly Mcconnell MRN: [id number] DOB: [birth year] Today's Date: 06/09/2023   History of Present Illness 78 yo female S/P R TKA 06/09/23. PMH  CP,afib    PT Comments  The patient ambulated x 50' x 2 usin 2 WRW. Patient noted urinary  incontinence   upon standing.  Patient demonstrated improved right knee  strength during ambulation. Spouse instructed to use safety belt and stand on the right side when assisting with ambulation. Patient has met PT goals for DC.   If plan is discharge home, recommend the following: A little help with walking and/or transfers;A little help with bathing/dressing/bathroom;Assistance with cooking/housework;Help with stairs or ramp for entrance   Can travel by private vehicle        Equipment Recommendations       Recommendations for Other Services       Precautions / Restrictions Precautions Precautions: Fall Restrictions Weight Bearing Restrictions Per Provider Order: No     Mobility  Bed Mobility     General bed mobility comments: in recliner    Transfers Overall transfer level: Needs assistance Equipment used: Rolling walker (2 wheels) Transfers: Sit to/from Stand Sit to Stand: Contact guard assist           General transfer comment: use of recliner arms, rails in BR    Ambulation/Gait Ambulation/Gait assistance: Contact guard assist, Min assist Gait Distance (Feet): 40 Feet (x 2) Assistive device: Rolling walker (2 wheels) Gait Pattern/deviations: Step-through pattern Gait velocity: decr     General Gait Details: cues for position iside Rw, staying close   Stairs Stairs: Yes       General stair comments: verbal instruction, up over door sill with left, out with right   Wheelchair Mobility     Tilt Bed    Modified Rankin (Stroke Patients Only)       Balance Overall balance assessment: Mild deficits observed, not formally tested                                           Communication Communication Communication: No apparent difficulties  Cognition Arousal: Alert Behavior During Therapy: WFL for tasks assessed/performed   PT - Cognitive impairments: No apparent impairments                         Following commands: Intact      Cueing    Exercises   General Comments    Pertinent Vitals/Pain Pain Assessment Pain Assessment: 0-10 Pain Score: 3  Pain Location: right knee Pain Descriptors / Indicators: Aching Pain Intervention(s): Monitored during session, Premedicated before session    Home Living Family/patient expects to be discharged to:: Private residence Living Arrangements: Spouse/significant other Available Help at Discharge: Family;Available 24 hours/day Type of Home: House Home Access: Level entry       Home Layout: One level Home Equipment: Rollator (4 wheels);Rolling Walker (2 wheels);Shower seat      Prior Function            PT Goals (current goals can now be found in the care plan section) Acute Rehab PT Goals Patient Stated Goal: go home PT Goal Formulation: With patient/family Time For Goal Achievement: 06/16/23 Potential to Achieve Goals: Good Progress towards PT goals: Progressing toward goals    Frequency    7X/week      PT Plan  Co-evaluation              AM-PAC PT "6 Clicks" Mobility   Outcome Measure  Help needed turning from your back to your side while in a flat bed without using bedrails?: A Little Help needed moving from lying on your back to sitting on the side of a flat bed without using bedrails?: A Little Help needed moving to and from a bed to a chair (including a wheelchair)?: A Little Help needed standing up from a chair using your arms (e.g., wheelchair or bedside chair)?: A Little Help needed to walk in hospital room?: A Little Help needed climbing 3-5 steps with a railing? : A Little 6 Click Score: 18    End of Session  Equipment Utilized During Treatment: Gait belt Activity Tolerance: Patient tolerated treatment well Patient left: with call bell/phone within reach;in chair;with family/visitor present Nurse Communication: Mobility status PT Visit Diagnosis: Unsteadiness on feet (R26.81);Muscle weakness (generalized) (M62.81);Pain Pain - Right/Left: Right Pain - part of body: Knee     Time: 1610-9604 PT Time Calculation (min) (ACUTE ONLY): 22 min  Charges:    $Gait Training: 8-22 mins PT General Charges $$ ACUTE PT VISIT: 1 Visit                     Blanchard Kelch PT Acute Rehabilitation Services Office [phone] Weekend [phone]    Rada Hay 06/09/2023, 3:55 PM

## 2023-06-09 NOTE — Transfer of Care (Signed)
 Immediate Anesthesia Transfer of Care Note  Patient: Holly Mcconnell  Procedure(s) Performed: ARTHROPLASTY, KNEE, TOTAL (Right: Knee)  Patient Location: PACU  Anesthesia Type:MAC and Spinal  Level of Consciousness: awake, alert , oriented, and patient cooperative  Airway & Oxygen Therapy: Patient Spontanous Breathing and Patient connected to face mask oxygen  Post-op Assessment: Report given to RN and Post -op Vital signs reviewed and stable  Post vital signs: Reviewed and stable  Last Vitals:  Vitals Value Taken Time  BP 99/60 06/09/23 0919  Temp    Pulse 72 06/09/23 0921  Resp 12 06/09/23 0921  SpO2 100 % 06/09/23 0921  Vitals shown include unfiled device data.  Last Pain:  Vitals:   06/09/23 0604  TempSrc: Oral  PainSc:       Patients Stated Pain Goal: 5 (06/09/23 0552)  Complications: No notable events documented.

## 2023-06-09 NOTE — Anesthesia Procedure Notes (Addendum)
 Anesthesia Regional Block: Adductor canal block   Pre-Anesthetic Checklist: , timeout performed,  Correct Patient, Correct Site, Correct Laterality,  Correct Procedure, Correct Position, site marked,  Risks and benefits discussed,  Surgical consent,  Pre-op evaluation,  At surgeon's request and post-op pain management  Laterality: Right  Prep: chloraprep       Needles:  Injection technique: Single-shot  Needle Type: Echogenic Needle     Needle Length: 9cm  Needle Gauge: 21     Additional Needles:   Procedures:,,,, ultrasound used (permanent image in chart),,    Narrative:  Start time: 06/09/2023 6:48 AM End time: 06/09/2023 6:54 AM Injection made incrementally with aspirations every 5 mL.  Performed by: Personally  Anesthesiologist: Collene Schlichter, MD  Additional Notes: No pain on injection. No increased resistance to injection. Injection made in 5cc increments.  Good needle visualization.  Patient tolerated procedure well.

## 2023-06-09 NOTE — Interval H&P Note (Signed)
 History and Physical Interval Note:  06/09/2023 7:20 AM  [name]  has presented today for surgery, with the diagnosis of RIGHT KNEE DEGENERATIVE JOINT DISEASE.  The various methods of treatment have been discussed with the patient and family. After consideration of risks, benefits and other options for treatment, the patient has consented to  Procedure(s) with comments: ARTHROPLASTY, KNEE, TOTAL (Right) - RIGHT TOTAL KNEE ARTHROPLASTY as a surgical intervention.  The patient's history has been reviewed, patient examined, no change in status, stable for surgery.  I have reviewed the patient's chart and labs.  Questions were answered to the patient's satisfaction.     Holly Mcconnell

## 2023-06-09 NOTE — Evaluation (Signed)
 Physical Therapy Evaluation Patient Details Name: Holly Mcconnell MRN: [id number] DOB: 09-Oct-1945 Today's Date: 06/09/2023  History of Present Illness  78 yo female S/P R TKA 06/09/23. PMH  CP,afib  Clinical Impression  The patient presents with noted right quad weakness with stance.   Patient took a few steps with rollator but not   able to get close enough to use UE;'s to take more weight. Also, noted right knee buckling with use of 2WRW. Will allow more time for leg  strength to return  and test safe ambulation.  Patient has History of cerebral palsy and does demonstrate impaired controlled movements.  Patient will benefit from acute PT.      If plan is discharge home, recommend the following: A little help with walking and/or transfers;A little help with bathing/dressing/bathroom;Assistance with cooking/housework;Help with stairs or ramp for entrance   Can travel by private vehicle    yes    Equipment Recommendations  none  Recommendations for Other Services       Functional Status Assessment Patient has had a recent decline in their functional status and demonstrates the ability to make significant improvements in function in a reasonable and predictable amount of time.     Precautions / Restrictions Precautions Precautions: Fall Restrictions Weight Bearing Restrictions Per Provider Order: No      Mobility  Bed Mobility Overal bed mobility: Needs Assistance Bed Mobility: Supine to Sit     Supine to sit: Min assist     General bed mobility comments: assist with legs and trunk    Transfers   Equipment used: Rolling walker (2 wheels), Rollator (4 wheels)               General transfer comment: mod assistance    Ambulation/Gait Ambulation/Gait assistance: Mod assist Gait Distance (Feet): 8 Feet Assistive device: Rolling walker (2 wheels), Rollator (4 wheels) Gait Pattern/deviations: Step-through pattern       General Gait Details: attempted  rollator but  unable to get close, Right knee buckled, Attempted with Rw still buckling  Stairs            Wheelchair Mobility     Tilt Bed    Modified Rankin (Stroke Patients Only)       Balance Overall balance assessment: Mild deficits observed, not formally tested                                           Pertinent Vitals/Pain Pain Assessment Pain Assessment: 0-10 Pain Score: 4  Pain Descriptors / Indicators: Aching Pain Intervention(s): Monitored during session, Premedicated before session    Home Living Family/patient expects to be discharged to:: Private residence Living Arrangements: Spouse/significant other Available Help at Discharge: Family;Available 24 hours/day Type of Home: House Home Access: Level entry       Home Layout: One level Home Equipment: Rollator (4 wheels);Rolling Walker (2 wheels);Shower seat      Prior Function Prior Level of Function : Independent/Modified Independent                     Extremity/Trunk Assessment   Upper Extremity Assessment Upper Extremity Assessment:  (mildly spasmodic movements)    Lower Extremity Assessment Lower Extremity Assessment: RLE deficits/detail RLE Deficits / Details: leg still slightly numb,       Communication   Communication Communication: No apparent difficulties    Cognition Arousal:  Alert Behavior During Therapy: WFL for tasks assessed/performed   PT - Cognitive impairments: No apparent impairments                         Following commands: Intact       Cueing       General Comments      Exercises Total Joint Exercises Ankle Circles/Pumps: AROM, Both, 10 reps Quad Sets: AROM, Both, 10 reps Straight Leg Raises: AAROM, Right, 10 reps   Assessment/Plan    PT Assessment Patient needs continued PT services  PT Problem List Decreased strength;Decreased activity tolerance;Decreased mobility;Decreased range of motion;Decreased  balance;Decreased safety awareness;Pain;Decreased knowledge of use of DME;Decreased knowledge of precautions       PT Treatment Interventions DME instruction;Therapeutic activities;Gait training;Functional mobility training;Therapeutic exercise;Patient/family education    PT Goals (Current goals can be found in the Care Plan section)  Acute Rehab PT Goals Patient Stated Goal: go home PT Goal Formulation: With patient/family Time For Goal Achievement: 06/16/23 Potential to Achieve Goals: Good    Frequency 7X/week     Co-evaluation               AM-PAC PT "6 Clicks" Mobility  Outcome Measure Help needed turning from your back to your side while in a flat bed without using bedrails?: A Little Help needed moving from lying on your back to sitting on the side of a flat bed without using bedrails?: A Little Help needed moving to and from a bed to a chair (including a wheelchair)?: A Little Help needed standing up from a chair using your arms (e.g., wheelchair or bedside chair)?: A Lot Help needed to walk in hospital room?: A Lot Help needed climbing 3-5 steps with a railing? : A Lot 6 Click Score: 15    End of Session Equipment Utilized During Treatment: Gait belt Activity Tolerance: Patient tolerated treatment well Patient left: with call bell/phone within reach;in chair;with family/visitor present Nurse Communication: Mobility status PT Visit Diagnosis: Unsteadiness on feet (R26.81);Muscle weakness (generalized) (M62.81);Pain Pain - Right/Left: Right Pain - part of body: Knee    Time: 1259-1320 PT Time Calculation (min) (ACUTE ONLY): 21 min   Charges:   PT Evaluation $PT Eval Low Complexity: 1 Low   PT General Charges $$ ACUTE PT VISIT: 1 Visit         Blanchard Kelch PT Acute Rehabilitation Services Office [phone] Weekend [phone]   Rada Hay 06/09/2023, 3:47 PM

## 2023-06-09 NOTE — Anesthesia Procedure Notes (Signed)
 Spinal  Start time: 06/09/2023 7:34 AM End time: 06/09/2023 7:38 AM Staffing Resident/CRNA: Elisabeth Cara, CRNA Performed by: Elisabeth Cara, CRNA Authorized by: Collene Schlichter, MD   Preanesthetic Checklist Completed: patient identified, IV checked, site marked, risks and benefits discussed, surgical consent, monitors and equipment checked, pre-op evaluation and timeout performed Spinal Block Prep: DuraPrep and site prepped and draped Patient monitoring: heart rate, cardiac monitor, continuous pulse ox and blood pressure Approach: midline Location: L3-4 Needle Needle type: Pencan  Needle gauge: 24 G Needle length: 10 cm Assessment Sensory level: T6 Events: CSF return Additional Notes Pt placed in sitting position for spinal placement. Spinal kit expiration date checked and verified. Lot # [id number] Sterile prep and drape of back. Site anesthetized with local. One attempt. - heme + free flowing clear CSF. Pt tolerated well.

## 2023-06-09 NOTE — Anesthesia Procedure Notes (Signed)
 Procedure Name: MAC Date/Time: 06/09/2023 7:31 AM  Performed by: Elisabeth Cara, CRNAPre-anesthesia Checklist: Patient identified, Emergency Drugs available, Suction available, Patient being monitored and Timeout performed Oxygen Delivery Method: Simple face mask Placement Confirmation: positive ETCO2 Dental Injury: Teeth and Oropharynx as per pre-operative assessment

## 2023-06-09 NOTE — Op Note (Signed)
 PREOP DIAGNOSIS: DJD RIGHT KNEE POSTOP DIAGNOSIS: same PROCEDURE: RIGHT TKR ANESTHESIA: Spinal and MAC ATTENDING SURGEON: Velna Ochs ASSISTANT: Holly Florence PA  INDICATIONS FOR PROCEDURE: Holly Mcconnell is a 78 y.o. female who has struggled for a long time with pain due to degenerative arthritis of the right knee.  The patient has failed many conservative non-operative measures and at this point has pain which limits the ability to sleep and walk.  The patient is offered total knee replacement.  Informed operative consent was obtained after discussion of possible risks of anesthesia, infection, neurovascular injury, DVT, and death.  The importance of the post-operative rehabilitation protocol to optimize result was stressed extensively with the patient.  SUMMARY OF FINDINGS AND PROCEDURE:  Holly Mcconnell was taken to the operative suite where under the above anesthesia a right knee replacement was performed.  There were advanced degenerative changes and the bone quality was good.  We used the DePuy Attune system and placed size 4 narrow femur, 4 tibia, 32 mm all polyethylene patella, and a size 6 mm spacer.  Holly Florence PA-C assisted throughout and was invaluable to the completion of the case in that he helped retract and maintain exposure while I placed components.  He also helped close thereby minimizing OR time.  The patient was admitted for appropriate post-op care to include perioperative antibiotics and mechanical and pharmacologic measures for DVT prophylaxis.  DESCRIPTION OF PROCEDURE:  Holly Mcconnell was taken to the operative suite where the above anesthesia was applied.  The patient was positioned supine and prepped and draped in normal sterile fashion.  An appropriate time out was performed.  After the administration of kefezol pre-op antibiotic the leg was elevated and exsanguinated and a tourniquet inflated. A standard longitudinal incision was made on the  anterior knee.  Dissection was carried down to the extensor mechanism.  All appropriate anti-infective measures were used including the pre-operative antibiotic, betadine impregnated drape, and closed hooded exhaust systems for each member of the surgical team.  A medial parapatellar incision was made in the extensor mechanism and the knee cap flipped and the knee flexed.  Some residual meniscal tissues were removed along with any remaining ACL/PCL tissue.  A guide was placed on the tibia and a flat cut was made on it's superior surface.  An intramedullary guide was placed in the femur and was utilized to make anterior and posterior cuts creating an appropriate flexion gap.  A second intramedullary guide was placed in the femur to make a distal cut properly balancing the knee with an extension gap equal to the flexion gap.  The three bones sized to the above mentioned sizes and the appropriate guides were placed and utilized.  A trial reduction was done and the knee easily came to full extension and the patella tracked well on flexion.  The trial components were removed and all bones were cleaned with pulsatile lavage and then dried thoroughly.  Cement was mixed and was pressurized onto the bones followed by placement of the aforementioned components.  Excess cement was trimmed and pressure was held on the components until the cement had hardened.  The tourniquet was deflated and a small amount of bleeding was controlled with cautery and pressure.  The knee was irrigated thoroughly.  The extensor mechanism was re-approximated with #1 ethibond in interrupted fashion.  The knee was flexed and the repair was solid.  The subcutaneous tissues were re-approximated with #0 and #2-0 vicryl and the skin closed  with a subcuticular stitch and steristrips.  A sterile dressing was applied.  Intraoperative fluids, EBL, and tourniquet time can be obtained from anesthesia records.  DISPOSITION:  The patient was taken to recovery  room in stable condition and scheduled to potentially go home same day depending on ability to walk and tolerate liquids.Holly Mcconnell 06/09/2023, 8:53 AM

## 2023-06-09 NOTE — Progress Notes (Signed)
 Orthopedic Tech Progress Note Patient Details:  Holly Mcconnell 1945/03/31 [id number]  Ortho Devices Type of Ortho Device: Bone foam zero knee Ortho Device/Splint Location: right Ortho Device/Splint Interventions: Ordered, Application, Adjustment   Post Interventions Patient Tolerated: Well Instructions Provided: Adjustment of device, Care of device  Kizzie Fantasia 06/09/2023, 12:15 PM

## 2023-06-10 ENCOUNTER — Encounter (HOSPITAL_COMMUNITY): Payer: Self-pay | Admitting: Orthopaedic Surgery

## 2023-06-10 NOTE — Anesthesia Postprocedure Evaluation (Signed)
 Anesthesia Post Note  Patient: Holly Mcconnell  Procedure(s) Performed: ARTHROPLASTY, KNEE, TOTAL (Right: Knee)     Patient location during evaluation: PACU Anesthesia Type: Spinal Level of consciousness: awake, awake and alert and oriented Pain management: pain level controlled Vital Signs Assessment: post-procedure vital signs reviewed and stable Respiratory status: spontaneous breathing, nonlabored ventilation and respiratory function stable Cardiovascular status: blood pressure returned to baseline and stable Postop Assessment: no headache, no backache, spinal receding and no apparent nausea or vomiting Anesthetic complications: no   No notable events documented.  Last Vitals:  Vitals:   06/09/23 1100 06/09/23 1200  BP: (!) 102/53 (!) 104/56  Pulse: 77 79  Resp:    Temp:    SpO2: 96% 93%    Last Pain:  Vitals:   06/09/23 1300  TempSrc:   PainSc: 0-No pain                 Collene Schlichter

## 2023-06-12 DIAGNOSIS — G809 Cerebral palsy, unspecified: Secondary | ICD-10-CM | POA: Diagnosis not present

## 2023-06-12 DIAGNOSIS — M546 Pain in thoracic spine: Secondary | ICD-10-CM | POA: Diagnosis not present

## 2023-06-12 DIAGNOSIS — M4140 Neuromuscular scoliosis, site unspecified: Secondary | ICD-10-CM | POA: Diagnosis not present

## 2023-06-12 DIAGNOSIS — M5459 Other low back pain: Secondary | ICD-10-CM | POA: Diagnosis not present

## 2023-06-15 DIAGNOSIS — M546 Pain in thoracic spine: Secondary | ICD-10-CM | POA: Diagnosis not present

## 2023-06-15 DIAGNOSIS — M4140 Neuromuscular scoliosis, site unspecified: Secondary | ICD-10-CM | POA: Diagnosis not present

## 2023-06-15 DIAGNOSIS — M5459 Other low back pain: Secondary | ICD-10-CM | POA: Diagnosis not present

## 2023-06-15 DIAGNOSIS — G809 Cerebral palsy, unspecified: Secondary | ICD-10-CM | POA: Diagnosis not present

## 2023-06-18 DIAGNOSIS — G809 Cerebral palsy, unspecified: Secondary | ICD-10-CM | POA: Diagnosis not present

## 2023-06-18 DIAGNOSIS — M5459 Other low back pain: Secondary | ICD-10-CM | POA: Diagnosis not present

## 2023-06-18 DIAGNOSIS — M1711 Unilateral primary osteoarthritis, right knee: Secondary | ICD-10-CM | POA: Diagnosis not present

## 2023-06-18 DIAGNOSIS — M546 Pain in thoracic spine: Secondary | ICD-10-CM | POA: Diagnosis not present

## 2023-06-18 DIAGNOSIS — M4140 Neuromuscular scoliosis, site unspecified: Secondary | ICD-10-CM | POA: Diagnosis not present

## 2023-06-22 DIAGNOSIS — M4140 Neuromuscular scoliosis, site unspecified: Secondary | ICD-10-CM | POA: Diagnosis not present

## 2023-06-22 DIAGNOSIS — M546 Pain in thoracic spine: Secondary | ICD-10-CM | POA: Diagnosis not present

## 2023-06-22 DIAGNOSIS — G809 Cerebral palsy, unspecified: Secondary | ICD-10-CM | POA: Diagnosis not present

## 2023-06-22 DIAGNOSIS — M5459 Other low back pain: Secondary | ICD-10-CM | POA: Diagnosis not present

## 2023-06-25 DIAGNOSIS — G809 Cerebral palsy, unspecified: Secondary | ICD-10-CM | POA: Diagnosis not present

## 2023-06-25 DIAGNOSIS — M546 Pain in thoracic spine: Secondary | ICD-10-CM | POA: Diagnosis not present

## 2023-06-25 DIAGNOSIS — M4140 Neuromuscular scoliosis, site unspecified: Secondary | ICD-10-CM | POA: Diagnosis not present

## 2023-06-25 DIAGNOSIS — M5459 Other low back pain: Secondary | ICD-10-CM | POA: Diagnosis not present

## 2023-06-26 DIAGNOSIS — R399 Unspecified symptoms and signs involving the genitourinary system: Secondary | ICD-10-CM | POA: Diagnosis not present

## 2023-06-26 DIAGNOSIS — Z6826 Body mass index (BMI) 26.0-26.9, adult: Secondary | ICD-10-CM | POA: Diagnosis not present

## 2023-06-29 DIAGNOSIS — M4140 Neuromuscular scoliosis, site unspecified: Secondary | ICD-10-CM | POA: Diagnosis not present

## 2023-06-29 DIAGNOSIS — G809 Cerebral palsy, unspecified: Secondary | ICD-10-CM | POA: Diagnosis not present

## 2023-06-29 DIAGNOSIS — M5459 Other low back pain: Secondary | ICD-10-CM | POA: Diagnosis not present

## 2023-06-29 DIAGNOSIS — M546 Pain in thoracic spine: Secondary | ICD-10-CM | POA: Diagnosis not present

## 2023-07-02 DIAGNOSIS — M4140 Neuromuscular scoliosis, site unspecified: Secondary | ICD-10-CM | POA: Diagnosis not present

## 2023-07-02 DIAGNOSIS — G809 Cerebral palsy, unspecified: Secondary | ICD-10-CM | POA: Diagnosis not present

## 2023-07-02 DIAGNOSIS — M546 Pain in thoracic spine: Secondary | ICD-10-CM | POA: Diagnosis not present

## 2023-07-02 DIAGNOSIS — M5459 Other low back pain: Secondary | ICD-10-CM | POA: Diagnosis not present

## 2023-07-06 DIAGNOSIS — M4140 Neuromuscular scoliosis, site unspecified: Secondary | ICD-10-CM | POA: Diagnosis not present

## 2023-07-06 DIAGNOSIS — M5459 Other low back pain: Secondary | ICD-10-CM | POA: Diagnosis not present

## 2023-07-06 DIAGNOSIS — G809 Cerebral palsy, unspecified: Secondary | ICD-10-CM | POA: Diagnosis not present

## 2023-07-06 DIAGNOSIS — M546 Pain in thoracic spine: Secondary | ICD-10-CM | POA: Diagnosis not present

## 2023-07-09 DIAGNOSIS — G809 Cerebral palsy, unspecified: Secondary | ICD-10-CM | POA: Diagnosis not present

## 2023-07-09 DIAGNOSIS — M546 Pain in thoracic spine: Secondary | ICD-10-CM | POA: Diagnosis not present

## 2023-07-09 DIAGNOSIS — M5459 Other low back pain: Secondary | ICD-10-CM | POA: Diagnosis not present

## 2023-07-09 DIAGNOSIS — M4140 Neuromuscular scoliosis, site unspecified: Secondary | ICD-10-CM | POA: Diagnosis not present

## 2023-07-13 DIAGNOSIS — G809 Cerebral palsy, unspecified: Secondary | ICD-10-CM | POA: Diagnosis not present

## 2023-07-13 DIAGNOSIS — M546 Pain in thoracic spine: Secondary | ICD-10-CM | POA: Diagnosis not present

## 2023-07-13 DIAGNOSIS — M5459 Other low back pain: Secondary | ICD-10-CM | POA: Diagnosis not present

## 2023-07-13 DIAGNOSIS — M4140 Neuromuscular scoliosis, site unspecified: Secondary | ICD-10-CM | POA: Diagnosis not present

## 2023-07-16 DIAGNOSIS — M4140 Neuromuscular scoliosis, site unspecified: Secondary | ICD-10-CM | POA: Diagnosis not present

## 2023-07-16 DIAGNOSIS — M546 Pain in thoracic spine: Secondary | ICD-10-CM | POA: Diagnosis not present

## 2023-07-16 DIAGNOSIS — M5459 Other low back pain: Secondary | ICD-10-CM | POA: Diagnosis not present

## 2023-07-16 DIAGNOSIS — G809 Cerebral palsy, unspecified: Secondary | ICD-10-CM | POA: Diagnosis not present

## 2023-07-20 DIAGNOSIS — M546 Pain in thoracic spine: Secondary | ICD-10-CM | POA: Diagnosis not present

## 2023-07-20 DIAGNOSIS — G809 Cerebral palsy, unspecified: Secondary | ICD-10-CM | POA: Diagnosis not present

## 2023-07-20 DIAGNOSIS — M5459 Other low back pain: Secondary | ICD-10-CM | POA: Diagnosis not present

## 2023-07-20 DIAGNOSIS — M4140 Neuromuscular scoliosis, site unspecified: Secondary | ICD-10-CM | POA: Diagnosis not present

## 2023-07-22 DIAGNOSIS — M1711 Unilateral primary osteoarthritis, right knee: Secondary | ICD-10-CM | POA: Diagnosis not present

## 2023-07-22 DIAGNOSIS — M545 Low back pain, unspecified: Secondary | ICD-10-CM | POA: Diagnosis not present

## 2023-07-23 DIAGNOSIS — M546 Pain in thoracic spine: Secondary | ICD-10-CM | POA: Diagnosis not present

## 2023-07-23 DIAGNOSIS — M4140 Neuromuscular scoliosis, site unspecified: Secondary | ICD-10-CM | POA: Diagnosis not present

## 2023-07-23 DIAGNOSIS — M5459 Other low back pain: Secondary | ICD-10-CM | POA: Diagnosis not present

## 2023-07-23 DIAGNOSIS — G809 Cerebral palsy, unspecified: Secondary | ICD-10-CM | POA: Diagnosis not present

## 2023-07-28 DIAGNOSIS — M4140 Neuromuscular scoliosis, site unspecified: Secondary | ICD-10-CM | POA: Diagnosis not present

## 2023-07-28 DIAGNOSIS — G809 Cerebral palsy, unspecified: Secondary | ICD-10-CM | POA: Diagnosis not present

## 2023-07-28 DIAGNOSIS — M546 Pain in thoracic spine: Secondary | ICD-10-CM | POA: Diagnosis not present

## 2023-07-28 DIAGNOSIS — M5459 Other low back pain: Secondary | ICD-10-CM | POA: Diagnosis not present

## 2023-07-30 DIAGNOSIS — M5459 Other low back pain: Secondary | ICD-10-CM | POA: Diagnosis not present

## 2023-07-30 DIAGNOSIS — G809 Cerebral palsy, unspecified: Secondary | ICD-10-CM | POA: Diagnosis not present

## 2023-07-30 DIAGNOSIS — M546 Pain in thoracic spine: Secondary | ICD-10-CM | POA: Diagnosis not present

## 2023-07-30 DIAGNOSIS — M4140 Neuromuscular scoliosis, site unspecified: Secondary | ICD-10-CM | POA: Diagnosis not present

## 2023-08-03 DIAGNOSIS — M5459 Other low back pain: Secondary | ICD-10-CM | POA: Diagnosis not present

## 2023-08-03 DIAGNOSIS — M546 Pain in thoracic spine: Secondary | ICD-10-CM | POA: Diagnosis not present

## 2023-08-03 DIAGNOSIS — G809 Cerebral palsy, unspecified: Secondary | ICD-10-CM | POA: Diagnosis not present

## 2023-08-03 DIAGNOSIS — M4140 Neuromuscular scoliosis, site unspecified: Secondary | ICD-10-CM | POA: Diagnosis not present

## 2023-08-03 DIAGNOSIS — M1711 Unilateral primary osteoarthritis, right knee: Secondary | ICD-10-CM | POA: Diagnosis not present

## 2023-08-06 DIAGNOSIS — M5459 Other low back pain: Secondary | ICD-10-CM | POA: Diagnosis not present

## 2023-08-06 DIAGNOSIS — G809 Cerebral palsy, unspecified: Secondary | ICD-10-CM | POA: Diagnosis not present

## 2023-08-06 DIAGNOSIS — M4140 Neuromuscular scoliosis, site unspecified: Secondary | ICD-10-CM | POA: Diagnosis not present

## 2023-08-06 DIAGNOSIS — M546 Pain in thoracic spine: Secondary | ICD-10-CM | POA: Diagnosis not present

## 2023-08-06 DIAGNOSIS — M1711 Unilateral primary osteoarthritis, right knee: Secondary | ICD-10-CM | POA: Diagnosis not present

## 2023-08-10 DIAGNOSIS — M4140 Neuromuscular scoliosis, site unspecified: Secondary | ICD-10-CM | POA: Diagnosis not present

## 2023-08-10 DIAGNOSIS — G809 Cerebral palsy, unspecified: Secondary | ICD-10-CM | POA: Diagnosis not present

## 2023-08-10 DIAGNOSIS — L293 Anogenital pruritus, unspecified: Secondary | ICD-10-CM | POA: Diagnosis not present

## 2023-08-10 DIAGNOSIS — M546 Pain in thoracic spine: Secondary | ICD-10-CM | POA: Diagnosis not present

## 2023-08-10 DIAGNOSIS — Z6828 Body mass index (BMI) 28.0-28.9, adult: Secondary | ICD-10-CM | POA: Diagnosis not present

## 2023-08-10 DIAGNOSIS — M5459 Other low back pain: Secondary | ICD-10-CM | POA: Diagnosis not present

## 2023-08-10 DIAGNOSIS — M1711 Unilateral primary osteoarthritis, right knee: Secondary | ICD-10-CM | POA: Diagnosis not present

## 2023-08-13 DIAGNOSIS — M4140 Neuromuscular scoliosis, site unspecified: Secondary | ICD-10-CM | POA: Diagnosis not present

## 2023-08-13 DIAGNOSIS — M5459 Other low back pain: Secondary | ICD-10-CM | POA: Diagnosis not present

## 2023-08-13 DIAGNOSIS — M546 Pain in thoracic spine: Secondary | ICD-10-CM | POA: Diagnosis not present

## 2023-08-13 DIAGNOSIS — G809 Cerebral palsy, unspecified: Secondary | ICD-10-CM | POA: Diagnosis not present

## 2023-08-13 DIAGNOSIS — M1711 Unilateral primary osteoarthritis, right knee: Secondary | ICD-10-CM | POA: Diagnosis not present

## 2023-08-17 DIAGNOSIS — M1711 Unilateral primary osteoarthritis, right knee: Secondary | ICD-10-CM | POA: Diagnosis not present

## 2023-08-17 DIAGNOSIS — M5459 Other low back pain: Secondary | ICD-10-CM | POA: Diagnosis not present

## 2023-08-17 DIAGNOSIS — M4140 Neuromuscular scoliosis, site unspecified: Secondary | ICD-10-CM | POA: Diagnosis not present

## 2023-08-17 DIAGNOSIS — G809 Cerebral palsy, unspecified: Secondary | ICD-10-CM | POA: Diagnosis not present

## 2023-08-17 DIAGNOSIS — M546 Pain in thoracic spine: Secondary | ICD-10-CM | POA: Diagnosis not present

## 2023-08-19 DIAGNOSIS — M546 Pain in thoracic spine: Secondary | ICD-10-CM | POA: Diagnosis not present

## 2023-08-19 DIAGNOSIS — M4140 Neuromuscular scoliosis, site unspecified: Secondary | ICD-10-CM | POA: Diagnosis not present

## 2023-08-19 DIAGNOSIS — M1711 Unilateral primary osteoarthritis, right knee: Secondary | ICD-10-CM | POA: Diagnosis not present

## 2023-08-19 DIAGNOSIS — M5459 Other low back pain: Secondary | ICD-10-CM | POA: Diagnosis not present

## 2023-08-19 DIAGNOSIS — G809 Cerebral palsy, unspecified: Secondary | ICD-10-CM | POA: Diagnosis not present

## 2023-08-24 NOTE — Progress Notes (Unsigned)
 Cardiology Office Note:    Date:  08/25/2023   ID:  Holly  Blen Mcconnell, DOB May 07, 1945, MRN [id number]  PCP:  Aisha Harvey, MD  Cardiologist:  Lonni LITTIE Nanas, MD  Electrophysiologist:  OLE ONEIDA HOLTS, MD   Referring MD: Aisha Harvey, MD   Chief Complaint  Patient presents with   Atrial flutter, unspecified type   Follow-up    History of Present Illness:    Holly  Jennyfer Mcconnell is a 78 y.o. female with a hx of atrial flutter, hypertension, cerebral palsy who presents for follow-up.  Wore 30-day monitor 03/2018, had episode of possible atach vs aflutter, lasted 10 minutes.  Suspecting likely atrial tachycardia and she was started on nadolol  and has had no further palpitations.  Switched in 08/2021 to metoprolol .    Zio patch x 14 days on 03/12/2022 showed 9% atrial flutter burden, average rate 134 bpm with longest episode lasting 1 hour with average rate 147 bpm, occasional PVCs (3% of beats).  Echocardiogram 03/20/2022 showed normal biventricular function, no significant valvular disease.  Referred to Dr. HOLTS in EP, underwent successful A-fib/flutter ablation 08/12/2022.  Underwent Watchman placement 10/30/2022.  Echocardiogram 09/24/2022 showed normal biventricular function, no significant valvular disease.  Since last clinic visit, she reports she is doing well.  She underwent knee surgery on 06/2023.  She is doing physical therapy currently.  Reports some lower extremity edema. Denies any chest pain, dyspnea, lightheadedness, syncope, or palpitations.      BP Readings from Last 3 Encounters:  08/25/23 120/66  06/09/23 (!) 104/56  06/04/23 121/67      Past Medical History:  Diagnosis Date   Arthritis    Atrial fibrillation (HCC)    Cerebral palsy (HCC)    Mild symptoms.   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 HOLTS    Past Surgical History:  Procedure Laterality Date    ATRIAL FIBRILLATION ABLATION N/A 08/12/2022   Procedure: ATRIAL FIBRILLATION ABLATION;  Surgeon: HOLTS OLE ONEIDA, MD;  Location: [facility] INVASIVE CV LAB;  Service: Cardiovascular;  Laterality: N/A;   CHOLECYSTECTOMY  1989   LEFT ATRIAL APPENDAGE OCCLUSION N/A 10/30/2022   Procedure: LEFT ATRIAL APPENDAGE OCCLUSION;  Surgeon: HOLTS OLE ONEIDA, MD;  Location: [facility] INVASIVE CV LAB;  Service: Cardiovascular;  Laterality: N/A;   minute man     in back per pt, screw in spine   TEE WITHOUT CARDIOVERSION N/A 08/12/2022   Procedure: TRANSESOPHAGEAL ECHOCARDIOGRAM;  Surgeon: HOLTS OLE ONEIDA, MD;  Location: [facility] INVASIVE CV LAB;  Service: Cardiovascular;  Laterality: N/A;   TEE WITHOUT CARDIOVERSION N/A 10/30/2022   Procedure: TRANSESOPHAGEAL ECHOCARDIOGRAM;  Surgeon: HOLTS OLE ONEIDA, MD;  Location: [facility] INVASIVE CV LAB;  Service: Cardiovascular;  Laterality: N/A;   TEMPOROMANDIBULAR JOINT ARTHROPLASTY     Thyroid  nodule resection  2005   TOTAL ABDOMINAL HYSTERECTOMY W/ BILATERAL SALPINGOOPHORECTOMY  1982   TOTAL KNEE ARTHROPLASTY Right 06/09/2023   Procedure: ARTHROPLASTY, KNEE, TOTAL;  Surgeon: Sheril Coy, MD;  Location: [facility] ORS;  Service: Orthopedics;  Laterality: Right;  RIGHT TOTAL KNEE ARTHROPLASTY    Current Medications: Current Meds  Medication Sig   atorvastatin (LIPITOR) 20 MG tablet Take 20 mg by mouth at bedtime.   Calcium Carbonate-Vit D-Min (CALCIUM 1200 PO) Take 1,200 mg by mouth every evening.   chlorhexidine  (HIBICLENS ) 4 % external liquid Apply 15 mLs (1 Application total) topically as directed for 30 doses. Use as directed daily for  5 days every other week for 6 weeks.   estradiol (ESTRACE) 0.1 MG/GM vaginal cream Place 1 Applicatorful vaginally 3 (three) times a week.   HYDROcodone -acetaminophen  (NORCO/VICODIN) 5-325 MG tablet Take 1-2 tablets by mouth every 6 (six) hours as needed for moderate pain (pain score 4-6) or severe pain (pain score 7-10) (post op pain).    ibandronate (BONIVA) 150 MG tablet Take 150 mg by mouth every 30 (thirty) days.   levothyroxine  (SYNTHROID , LEVOTHROID) 75 MCG tablet Take 37.5-75 mcg by mouth See admin instructions. Take 0.5 tablet (37.5 mcg) by mouth in the morning on Sundays.  Take 1 tablet (75 mcg) by mouth once daily in the morning on all other days.   metoprolol  succinate (TOPROL  XL) 25 MG 24 hr tablet Take 1 tablet (25 mg total) by mouth daily.   Multiple Vitamin (MULTIVITAMIN WITH MINERALS) TABS tablet Take 1 tablet by mouth every evening.   Polyethyl Glycol-Propyl Glycol (SYSTANE) 0.4-0.3 % GEL ophthalmic gel Place 1 Application into both eyes at bedtime.   Polyethyl Glycol-Propyl Glycol (SYSTANE) 0.4-0.3 % SOLN Place 1 drop into both eyes in the morning and at bedtime.   SUMAtriptan (IMITREX) 100 MG tablet Take 100 mg by mouth every 2 (two) hours as needed for migraine. May repeat in 2 hours if headache persists or recurs.   tiZANidine  (ZANAFLEX ) 2 MG tablet Take 1-2 tablets (2-4 mg total) by mouth every 6 (six) hours as needed for muscle spasms.   Current Facility-Administered Medications for the 08/25/23 encounter (Office Visit) with Kate Lonni CROME, MD  Medication   botulinum toxin Type A  (BOTOX) injection 100 Units   sodium chloride  (PF) 0.9 % injection 2 mL     Allergies:   Clavulanic acid, Nitrofurantoin, Amoxicillin, Nabumetone, Other, Sulfa antibiotics, and Codeine   Social History   Socioeconomic History   Marital status: Married    Spouse name: Not on file   Number of children: 1   Years of education: Not on file   Highest education level: Some college, no degree  Occupational History   Not on file  Tobacco Use   Smoking status: Never   Smokeless tobacco: Never  Vaping Use   Vaping status: Never Used  Substance and Sexual Activity   Alcohol use: Never   Drug use: Never   Sexual activity: Not on file  Other Topics Concern   Not on file  Social History Narrative   She is retired.   Formerly worked in Community education officer.  She is married (husband's name is Gerlene -married in January 03, 1967).   They have 1 child and 8 biologic grandkids and one adopted grandkid.  She lives with her husband and is very active.     She does cardio-strength exercises with a trainer roughly 5 days a week for 30+ minutes at a time.      She walks with a walker and has some mild speech deficits and musculoskeletal deficits from history of cerebral palsy syndrome. -- >  Does have a handicap placard      Social Drivers of Health   Financial Resource Strain: Low Risk  (12/07/2020)   Received from [facility]   Overall Financial Resource Strain (CARDIA)    Difficulty of Paying Living Expenses: Not hard at all  Food Insecurity: No Food Insecurity (12/07/2020)   Received from [facility]   Hunger Vital Sign    Within the past 12 months, you worried that your food would run out before you got the money to  buy more.: Never true    Within the past 12 months, the food you bought just didn't last and you didn't have money to get more.: Never true  Transportation Needs: No Transportation Needs (12/07/2020)   Received from [facility]   PRAPARE - Transportation    Lack of Transportation (Medical): No    Lack of Transportation (Non-Medical): No  Physical Activity: Sufficiently Active (12/07/2020)   Received from [facility]   Exercise Vital Sign    On average, how many days per week do you engage in moderate to strenuous exercise (like a brisk walk)?: 5 days    On average, how many minutes do you engage in exercise at this level?: 60 min  Stress: No Stress Concern Present (12/07/2020)   Received from [facility] - Occupational Stress Questionnaire    Feeling of Stress : Only a little  Social Connections: Unknown (07/16/2021)   Received from [facility]   Social Network    Social Network: Not on file     Family History: The patient's family history  includes Atrial fibrillation in her maternal grandfather and maternal uncle; Atrial fibrillation (age of onset: 43) in her mother; Breast cancer in her maternal grandmother; CAD in her mother; COPD in her mother; Colon cancer in her maternal grandmother; Hodgkin's lymphoma in her father; Hypercholesterolemia in her mother; Hyperlipidemia in her sister; Hypertension in her mother and sister; Hypothyroidism in her mother; Lung cancer in her father; Pancreatic cancer in her sister; Stroke in her mother.  ROS:   Please see the history of present illness.     All other systems reviewed and are negative.  EKGs/Labs/Other Studies Reviewed:    The following studies were reviewed today:   EKG:   02/17/2022: Normal sinus rhythm, rate 70, low voltage, no ST abnormalities 03/18/2022: Normal sinus rhythm, rate 75, no ST abnormality 08/25/2023: Normal sinus rhythm, rate 89, no ST abnormalities  Recent Labs: 06/04/2023: BUN 15; Creatinine, Ser 0.65; Hemoglobin 12.6; Platelets 277; Potassium 4.3; Sodium 143  Recent Lipid Panel No results found for: CHOL, TRIG, HDL, CHOLHDL, VLDL, LDLCALC, LDLDIRECT  Physical Exam:    VS:  BP 120/66 (BP Location: Right Arm, Patient Position: Sitting, Cuff Size: Normal)   Pulse 93   Resp 16   Ht 4' 11 (1.499 m)   Wt 146 lb 12.8 oz (66.6 kg)   SpO2 93%   BMI 29.65 kg/m     Wt Readings from Last 3 Encounters:  08/25/23 146 lb 12.8 oz (66.6 kg)  06/09/23 147 lb (66.7 kg)  06/04/23 147 lb (66.7 kg)     GEN:   in no acute distress HEENT: Normal NECK: No JVD; No carotid bruits CARDIAC: RRR, no murmurs, rubs, gallops RESPIRATORY:  Clear to auscultation without rales, wheezing or rhonchi  ABDOMEN: Soft, non-tender, non-distended MUSCULOSKELETAL:  No edema; No deformity  SKIN: Warm and dry NEUROLOGIC:  Alert and oriented x 3 PSYCHIATRIC:  Normal affect   ASSESSMENT:    1. Atrial flutter, unspecified type (HCC)   2. Essential hypertension   3.  Hyperlipidemia, unspecified hyperlipidemia type       PLAN:    Atrial flutter: Zio patch x 14 days on 03/12/2022 showed 9% atrial flutter burden, average rate 134 bpm with longest episode lasting 1 hour with average rate 147 bpm, occasional PVCs (3% of beats).  Echocardiogram 03/20/2022 showed normal biventricular function, no significant valvular disease.  Referred to Dr. Cindie in EP, underwent  successful A-fib/flutter ablation 08/12/2022 -Continue toprol -XL 12.5 mg daily -CHA2DS2-VASc 4 (age x 2, hypertension, female).  She does have a history of falls with cerebral palsy and would prefer to avoid long-term anticoagulation.  Referred to Dr. Cindie and underwent Watchman placement 10/30/2022.  She was on Eliquis  through 12/14/2022 and then Plavix  through 05/01/2023, she is now off all anticoagulation/antiplatelets  Hypertension: On Toprol -XL 25 mg daily  Hyperlipidemia: On atorvastatin 20 mg daily.  LDL 80 and 04/29/2022. Calcium score 0 on preablation CT 08/05/22    RTC in 6 months   Medication Adjustments/Labs and Tests Ordered: Current medicines are reviewed at length with the patient today.  Concerns regarding medicines are outlined above.  Orders Placed This Encounter  Procedures   EKG 12-Lead   No orders of the defined types were placed in this encounter.   Patient Instructions  Medication Instructions:  Continue current medication *If you need a refill on your cardiac medications before your next appointment, please call your pharmacy*  Lab Work: none If you have labs (blood work) drawn today and your tests are completely normal, you will receive your results only by: MyChart Message (if you have MyChart) OR A paper copy in the mail If you have any lab test that is abnormal or we need to change your treatment, we will call you to review the results.  Testing/Procedures: nojne  Follow-Up: At [facility], you and your health needs are our priority.  As part of  our continuing mission to provide you with exceptional heart care, our providers are all part of one team.  This team includes your primary Cardiologist (physician) and Advanced Practice Providers or APPs (Physician Assistants and Nurse Practitioners) who all work together to provide you with the care you need, when you need it.  Your next appointment:   6 month(s)  Provider:   Lonni LITTIE Nanas, MD    We recommend signing up for the patient portal called MyChart.  Sign up information is provided on this After Visit Summary.  MyChart is used to connect with patients for Virtual Visits (Telemedicine).  Patients are able to view lab/test results, encounter notes, upcoming appointments, etc.  Non-urgent messages can be sent to your provider as well.   To learn more about what you can do with MyChart, go to [email or web address].   Other Instructions none       Signed, Lonni LITTIE Nanas, MD  08/25/2023 3:35 PM     [facility]

## 2023-08-25 ENCOUNTER — Encounter: Payer: Self-pay | Admitting: Cardiology

## 2023-08-25 ENCOUNTER — Ambulatory Visit: Payer: PPO | Attending: Cardiology | Admitting: Cardiology

## 2023-08-25 VITALS — BP 120/66 | HR 93 | Resp 16 | Ht 59.0 in | Wt 146.8 lb

## 2023-08-25 DIAGNOSIS — I10 Essential (primary) hypertension: Secondary | ICD-10-CM

## 2023-08-25 DIAGNOSIS — E785 Hyperlipidemia, unspecified: Secondary | ICD-10-CM | POA: Diagnosis not present

## 2023-08-25 DIAGNOSIS — I4892 Unspecified atrial flutter: Secondary | ICD-10-CM | POA: Diagnosis not present

## 2023-08-25 NOTE — Patient Instructions (Signed)
 Medication Instructions:  Continue current medication *If you need a refill on your cardiac medications before your next appointment, please call your pharmacy*  Lab Work: none If you have labs (blood work) drawn today and your tests are completely normal, you will receive your results only by: MyChart Message (if you have MyChart) OR A paper copy in the mail If you have any lab test that is abnormal or we need to change your treatment, we will call you to review the results.  Testing/Procedures: nojne  Follow-Up: At [facility], you and your health needs are our priority.  As part of our continuing mission to provide you with exceptional heart care, our providers are all part of one team.  This team includes your primary Cardiologist (physician) and Advanced Practice Providers or APPs (Physician Assistants and Nurse Practitioners) who all work together to provide you with the care you need, when you need it.  Your next appointment:   6 month(s)  Provider:   Lonni LITTIE Nanas, MD    We recommend signing up for the patient portal called MyChart.  Sign up information is provided on this After Visit Summary.  MyChart is used to connect with patients for Virtual Visits (Telemedicine).  Patients are able to view lab/test results, encounter notes, upcoming appointments, etc.  Non-urgent messages can be sent to your provider as well.   To learn more about what you can do with MyChart, go to [email or web address].   Other Instructions none

## 2023-08-26 DIAGNOSIS — Z6828 Body mass index (BMI) 28.0-28.9, adult: Secondary | ICD-10-CM | POA: Diagnosis not present

## 2023-08-26 DIAGNOSIS — R300 Dysuria: Secondary | ICD-10-CM | POA: Diagnosis not present

## 2023-08-26 DIAGNOSIS — R350 Frequency of micturition: Secondary | ICD-10-CM | POA: Diagnosis not present

## 2023-08-27 DIAGNOSIS — G809 Cerebral palsy, unspecified: Secondary | ICD-10-CM | POA: Diagnosis not present

## 2023-08-27 DIAGNOSIS — M546 Pain in thoracic spine: Secondary | ICD-10-CM | POA: Diagnosis not present

## 2023-08-27 DIAGNOSIS — M1711 Unilateral primary osteoarthritis, right knee: Secondary | ICD-10-CM | POA: Diagnosis not present

## 2023-08-27 DIAGNOSIS — M4140 Neuromuscular scoliosis, site unspecified: Secondary | ICD-10-CM | POA: Diagnosis not present

## 2023-08-27 DIAGNOSIS — M5459 Other low back pain: Secondary | ICD-10-CM | POA: Diagnosis not present

## 2023-08-31 DIAGNOSIS — M4140 Neuromuscular scoliosis, site unspecified: Secondary | ICD-10-CM | POA: Diagnosis not present

## 2023-08-31 DIAGNOSIS — M1711 Unilateral primary osteoarthritis, right knee: Secondary | ICD-10-CM | POA: Diagnosis not present

## 2023-08-31 DIAGNOSIS — M47897 Other spondylosis, lumbosacral region: Secondary | ICD-10-CM | POA: Diagnosis not present

## 2023-08-31 DIAGNOSIS — G809 Cerebral palsy, unspecified: Secondary | ICD-10-CM | POA: Diagnosis not present

## 2023-08-31 DIAGNOSIS — I4819 Other persistent atrial fibrillation: Secondary | ICD-10-CM | POA: Diagnosis not present

## 2023-08-31 DIAGNOSIS — M5459 Other low back pain: Secondary | ICD-10-CM | POA: Diagnosis not present

## 2023-08-31 DIAGNOSIS — E782 Mixed hyperlipidemia: Secondary | ICD-10-CM | POA: Diagnosis not present

## 2023-08-31 DIAGNOSIS — E039 Hypothyroidism, unspecified: Secondary | ICD-10-CM | POA: Diagnosis not present

## 2023-08-31 DIAGNOSIS — I4891 Unspecified atrial fibrillation: Secondary | ICD-10-CM | POA: Diagnosis not present

## 2023-08-31 DIAGNOSIS — M546 Pain in thoracic spine: Secondary | ICD-10-CM | POA: Diagnosis not present

## 2023-09-03 DIAGNOSIS — M4140 Neuromuscular scoliosis, site unspecified: Secondary | ICD-10-CM | POA: Diagnosis not present

## 2023-09-03 DIAGNOSIS — M546 Pain in thoracic spine: Secondary | ICD-10-CM | POA: Diagnosis not present

## 2023-09-03 DIAGNOSIS — M5459 Other low back pain: Secondary | ICD-10-CM | POA: Diagnosis not present

## 2023-09-03 DIAGNOSIS — G809 Cerebral palsy, unspecified: Secondary | ICD-10-CM | POA: Diagnosis not present

## 2023-09-03 DIAGNOSIS — M1711 Unilateral primary osteoarthritis, right knee: Secondary | ICD-10-CM | POA: Diagnosis not present

## 2023-09-08 DIAGNOSIS — G809 Cerebral palsy, unspecified: Secondary | ICD-10-CM | POA: Diagnosis not present

## 2023-09-08 DIAGNOSIS — M5459 Other low back pain: Secondary | ICD-10-CM | POA: Diagnosis not present

## 2023-09-08 DIAGNOSIS — M546 Pain in thoracic spine: Secondary | ICD-10-CM | POA: Diagnosis not present

## 2023-09-08 DIAGNOSIS — M4140 Neuromuscular scoliosis, site unspecified: Secondary | ICD-10-CM | POA: Diagnosis not present

## 2023-09-08 DIAGNOSIS — M1711 Unilateral primary osteoarthritis, right knee: Secondary | ICD-10-CM | POA: Diagnosis not present

## 2023-09-10 DIAGNOSIS — M546 Pain in thoracic spine: Secondary | ICD-10-CM | POA: Diagnosis not present

## 2023-09-10 DIAGNOSIS — G809 Cerebral palsy, unspecified: Secondary | ICD-10-CM | POA: Diagnosis not present

## 2023-09-10 DIAGNOSIS — M1711 Unilateral primary osteoarthritis, right knee: Secondary | ICD-10-CM | POA: Diagnosis not present

## 2023-09-10 DIAGNOSIS — M4140 Neuromuscular scoliosis, site unspecified: Secondary | ICD-10-CM | POA: Diagnosis not present

## 2023-09-10 DIAGNOSIS — M5459 Other low back pain: Secondary | ICD-10-CM | POA: Diagnosis not present

## 2023-09-14 DIAGNOSIS — M546 Pain in thoracic spine: Secondary | ICD-10-CM | POA: Diagnosis not present

## 2023-09-14 DIAGNOSIS — M1711 Unilateral primary osteoarthritis, right knee: Secondary | ICD-10-CM | POA: Diagnosis not present

## 2023-09-14 DIAGNOSIS — M5459 Other low back pain: Secondary | ICD-10-CM | POA: Diagnosis not present

## 2023-09-14 DIAGNOSIS — M4140 Neuromuscular scoliosis, site unspecified: Secondary | ICD-10-CM | POA: Diagnosis not present

## 2023-09-14 DIAGNOSIS — G809 Cerebral palsy, unspecified: Secondary | ICD-10-CM | POA: Diagnosis not present

## 2023-09-17 DIAGNOSIS — M1711 Unilateral primary osteoarthritis, right knee: Secondary | ICD-10-CM | POA: Diagnosis not present

## 2023-09-17 DIAGNOSIS — M546 Pain in thoracic spine: Secondary | ICD-10-CM | POA: Diagnosis not present

## 2023-09-17 DIAGNOSIS — M4140 Neuromuscular scoliosis, site unspecified: Secondary | ICD-10-CM | POA: Diagnosis not present

## 2023-09-17 DIAGNOSIS — G809 Cerebral palsy, unspecified: Secondary | ICD-10-CM | POA: Diagnosis not present

## 2023-09-17 DIAGNOSIS — M5459 Other low back pain: Secondary | ICD-10-CM | POA: Diagnosis not present

## 2023-09-21 DIAGNOSIS — M5459 Other low back pain: Secondary | ICD-10-CM | POA: Diagnosis not present

## 2023-09-21 DIAGNOSIS — G809 Cerebral palsy, unspecified: Secondary | ICD-10-CM | POA: Diagnosis not present

## 2023-09-21 DIAGNOSIS — M1711 Unilateral primary osteoarthritis, right knee: Secondary | ICD-10-CM | POA: Diagnosis not present

## 2023-09-21 DIAGNOSIS — M4140 Neuromuscular scoliosis, site unspecified: Secondary | ICD-10-CM | POA: Diagnosis not present

## 2023-09-21 DIAGNOSIS — M546 Pain in thoracic spine: Secondary | ICD-10-CM | POA: Diagnosis not present

## 2023-09-24 DIAGNOSIS — M4140 Neuromuscular scoliosis, site unspecified: Secondary | ICD-10-CM | POA: Diagnosis not present

## 2023-09-24 DIAGNOSIS — M1711 Unilateral primary osteoarthritis, right knee: Secondary | ICD-10-CM | POA: Diagnosis not present

## 2023-09-24 DIAGNOSIS — M5459 Other low back pain: Secondary | ICD-10-CM | POA: Diagnosis not present

## 2023-09-24 DIAGNOSIS — M546 Pain in thoracic spine: Secondary | ICD-10-CM | POA: Diagnosis not present

## 2023-09-24 DIAGNOSIS — G809 Cerebral palsy, unspecified: Secondary | ICD-10-CM | POA: Diagnosis not present

## 2023-09-28 DIAGNOSIS — M1711 Unilateral primary osteoarthritis, right knee: Secondary | ICD-10-CM | POA: Diagnosis not present

## 2023-09-28 DIAGNOSIS — M4140 Neuromuscular scoliosis, site unspecified: Secondary | ICD-10-CM | POA: Diagnosis not present

## 2023-09-28 DIAGNOSIS — G809 Cerebral palsy, unspecified: Secondary | ICD-10-CM | POA: Diagnosis not present

## 2023-09-28 DIAGNOSIS — M5459 Other low back pain: Secondary | ICD-10-CM | POA: Diagnosis not present

## 2023-09-28 DIAGNOSIS — M546 Pain in thoracic spine: Secondary | ICD-10-CM | POA: Diagnosis not present

## 2023-09-29 DIAGNOSIS — I4819 Other persistent atrial fibrillation: Secondary | ICD-10-CM | POA: Diagnosis not present

## 2023-09-29 DIAGNOSIS — I4891 Unspecified atrial fibrillation: Secondary | ICD-10-CM | POA: Diagnosis not present

## 2023-09-30 DIAGNOSIS — M1712 Unilateral primary osteoarthritis, left knee: Secondary | ICD-10-CM | POA: Diagnosis not present

## 2023-10-01 DIAGNOSIS — M546 Pain in thoracic spine: Secondary | ICD-10-CM | POA: Diagnosis not present

## 2023-10-01 DIAGNOSIS — G809 Cerebral palsy, unspecified: Secondary | ICD-10-CM | POA: Diagnosis not present

## 2023-10-01 DIAGNOSIS — I4891 Unspecified atrial fibrillation: Secondary | ICD-10-CM | POA: Diagnosis not present

## 2023-10-01 DIAGNOSIS — E782 Mixed hyperlipidemia: Secondary | ICD-10-CM | POA: Diagnosis not present

## 2023-10-01 DIAGNOSIS — M47897 Other spondylosis, lumbosacral region: Secondary | ICD-10-CM | POA: Diagnosis not present

## 2023-10-01 DIAGNOSIS — E039 Hypothyroidism, unspecified: Secondary | ICD-10-CM | POA: Diagnosis not present

## 2023-10-01 DIAGNOSIS — I4819 Other persistent atrial fibrillation: Secondary | ICD-10-CM | POA: Diagnosis not present

## 2023-10-01 DIAGNOSIS — M1711 Unilateral primary osteoarthritis, right knee: Secondary | ICD-10-CM | POA: Diagnosis not present

## 2023-10-01 DIAGNOSIS — M4140 Neuromuscular scoliosis, site unspecified: Secondary | ICD-10-CM | POA: Diagnosis not present

## 2023-10-01 DIAGNOSIS — M5459 Other low back pain: Secondary | ICD-10-CM | POA: Diagnosis not present

## 2023-10-05 DIAGNOSIS — M4140 Neuromuscular scoliosis, site unspecified: Secondary | ICD-10-CM | POA: Diagnosis not present

## 2023-10-05 DIAGNOSIS — M5459 Other low back pain: Secondary | ICD-10-CM | POA: Diagnosis not present

## 2023-10-05 DIAGNOSIS — M546 Pain in thoracic spine: Secondary | ICD-10-CM | POA: Diagnosis not present

## 2023-10-05 DIAGNOSIS — M1711 Unilateral primary osteoarthritis, right knee: Secondary | ICD-10-CM | POA: Diagnosis not present

## 2023-10-05 DIAGNOSIS — G809 Cerebral palsy, unspecified: Secondary | ICD-10-CM | POA: Diagnosis not present

## 2023-10-07 DIAGNOSIS — M5459 Other low back pain: Secondary | ICD-10-CM | POA: Diagnosis not present

## 2023-10-07 DIAGNOSIS — M546 Pain in thoracic spine: Secondary | ICD-10-CM | POA: Diagnosis not present

## 2023-10-07 DIAGNOSIS — M1711 Unilateral primary osteoarthritis, right knee: Secondary | ICD-10-CM | POA: Diagnosis not present

## 2023-10-07 DIAGNOSIS — G809 Cerebral palsy, unspecified: Secondary | ICD-10-CM | POA: Diagnosis not present

## 2023-10-07 DIAGNOSIS — M4140 Neuromuscular scoliosis, site unspecified: Secondary | ICD-10-CM | POA: Diagnosis not present

## 2023-10-12 DIAGNOSIS — G809 Cerebral palsy, unspecified: Secondary | ICD-10-CM | POA: Diagnosis not present

## 2023-10-12 DIAGNOSIS — M4140 Neuromuscular scoliosis, site unspecified: Secondary | ICD-10-CM | POA: Diagnosis not present

## 2023-10-12 DIAGNOSIS — M1711 Unilateral primary osteoarthritis, right knee: Secondary | ICD-10-CM | POA: Diagnosis not present

## 2023-10-12 DIAGNOSIS — M5459 Other low back pain: Secondary | ICD-10-CM | POA: Diagnosis not present

## 2023-10-12 DIAGNOSIS — M546 Pain in thoracic spine: Secondary | ICD-10-CM | POA: Diagnosis not present

## 2023-10-15 ENCOUNTER — Other Ambulatory Visit (HOSPITAL_BASED_OUTPATIENT_CLINIC_OR_DEPARTMENT_OTHER): Payer: Self-pay | Admitting: Family Medicine

## 2023-10-15 DIAGNOSIS — M546 Pain in thoracic spine: Secondary | ICD-10-CM | POA: Diagnosis not present

## 2023-10-15 DIAGNOSIS — M1711 Unilateral primary osteoarthritis, right knee: Secondary | ICD-10-CM | POA: Diagnosis not present

## 2023-10-15 DIAGNOSIS — G809 Cerebral palsy, unspecified: Secondary | ICD-10-CM | POA: Diagnosis not present

## 2023-10-15 DIAGNOSIS — M85852 Other specified disorders of bone density and structure, left thigh: Secondary | ICD-10-CM

## 2023-10-15 DIAGNOSIS — M4140 Neuromuscular scoliosis, site unspecified: Secondary | ICD-10-CM | POA: Diagnosis not present

## 2023-10-15 DIAGNOSIS — M5459 Other low back pain: Secondary | ICD-10-CM | POA: Diagnosis not present

## 2023-10-19 DIAGNOSIS — M4140 Neuromuscular scoliosis, site unspecified: Secondary | ICD-10-CM | POA: Diagnosis not present

## 2023-10-19 DIAGNOSIS — M5459 Other low back pain: Secondary | ICD-10-CM | POA: Diagnosis not present

## 2023-10-19 DIAGNOSIS — M1711 Unilateral primary osteoarthritis, right knee: Secondary | ICD-10-CM | POA: Diagnosis not present

## 2023-10-19 DIAGNOSIS — G809 Cerebral palsy, unspecified: Secondary | ICD-10-CM | POA: Diagnosis not present

## 2023-10-19 DIAGNOSIS — M546 Pain in thoracic spine: Secondary | ICD-10-CM | POA: Diagnosis not present

## 2023-10-26 DIAGNOSIS — M5459 Other low back pain: Secondary | ICD-10-CM | POA: Diagnosis not present

## 2023-10-26 DIAGNOSIS — M1711 Unilateral primary osteoarthritis, right knee: Secondary | ICD-10-CM | POA: Diagnosis not present

## 2023-10-26 DIAGNOSIS — M546 Pain in thoracic spine: Secondary | ICD-10-CM | POA: Diagnosis not present

## 2023-10-26 DIAGNOSIS — M4140 Neuromuscular scoliosis, site unspecified: Secondary | ICD-10-CM | POA: Diagnosis not present

## 2023-10-26 DIAGNOSIS — G809 Cerebral palsy, unspecified: Secondary | ICD-10-CM | POA: Diagnosis not present

## 2023-10-28 DIAGNOSIS — M1712 Unilateral primary osteoarthritis, left knee: Secondary | ICD-10-CM | POA: Diagnosis not present

## 2023-10-29 DIAGNOSIS — M4140 Neuromuscular scoliosis, site unspecified: Secondary | ICD-10-CM | POA: Diagnosis not present

## 2023-10-29 DIAGNOSIS — I4891 Unspecified atrial fibrillation: Secondary | ICD-10-CM | POA: Diagnosis not present

## 2023-10-29 DIAGNOSIS — M5459 Other low back pain: Secondary | ICD-10-CM | POA: Diagnosis not present

## 2023-10-29 DIAGNOSIS — G809 Cerebral palsy, unspecified: Secondary | ICD-10-CM | POA: Diagnosis not present

## 2023-10-29 DIAGNOSIS — M1711 Unilateral primary osteoarthritis, right knee: Secondary | ICD-10-CM | POA: Diagnosis not present

## 2023-10-29 DIAGNOSIS — I4819 Other persistent atrial fibrillation: Secondary | ICD-10-CM | POA: Diagnosis not present

## 2023-10-29 DIAGNOSIS — M546 Pain in thoracic spine: Secondary | ICD-10-CM | POA: Diagnosis not present

## 2023-11-01 DIAGNOSIS — I4819 Other persistent atrial fibrillation: Secondary | ICD-10-CM | POA: Diagnosis not present

## 2023-11-01 DIAGNOSIS — E782 Mixed hyperlipidemia: Secondary | ICD-10-CM | POA: Diagnosis not present

## 2023-11-01 DIAGNOSIS — I4891 Unspecified atrial fibrillation: Secondary | ICD-10-CM | POA: Diagnosis not present

## 2023-11-01 DIAGNOSIS — M1711 Unilateral primary osteoarthritis, right knee: Secondary | ICD-10-CM | POA: Diagnosis not present

## 2023-11-01 DIAGNOSIS — E039 Hypothyroidism, unspecified: Secondary | ICD-10-CM | POA: Diagnosis not present

## 2023-11-01 DIAGNOSIS — M47897 Other spondylosis, lumbosacral region: Secondary | ICD-10-CM | POA: Diagnosis not present

## 2023-11-03 DIAGNOSIS — M546 Pain in thoracic spine: Secondary | ICD-10-CM | POA: Diagnosis not present

## 2023-11-03 DIAGNOSIS — M5459 Other low back pain: Secondary | ICD-10-CM | POA: Diagnosis not present

## 2023-11-03 DIAGNOSIS — M4140 Neuromuscular scoliosis, site unspecified: Secondary | ICD-10-CM | POA: Diagnosis not present

## 2023-11-03 DIAGNOSIS — M1711 Unilateral primary osteoarthritis, right knee: Secondary | ICD-10-CM | POA: Diagnosis not present

## 2023-11-03 DIAGNOSIS — G809 Cerebral palsy, unspecified: Secondary | ICD-10-CM | POA: Diagnosis not present

## 2023-11-04 DIAGNOSIS — L578 Other skin changes due to chronic exposure to nonionizing radiation: Secondary | ICD-10-CM | POA: Diagnosis not present

## 2023-11-04 DIAGNOSIS — L814 Other melanin hyperpigmentation: Secondary | ICD-10-CM | POA: Diagnosis not present

## 2023-11-04 DIAGNOSIS — L821 Other seborrheic keratosis: Secondary | ICD-10-CM | POA: Diagnosis not present

## 2023-11-04 DIAGNOSIS — D229 Melanocytic nevi, unspecified: Secondary | ICD-10-CM | POA: Diagnosis not present

## 2023-11-04 DIAGNOSIS — L304 Erythema intertrigo: Secondary | ICD-10-CM | POA: Diagnosis not present

## 2023-11-04 DIAGNOSIS — D1801 Hemangioma of skin and subcutaneous tissue: Secondary | ICD-10-CM | POA: Diagnosis not present

## 2023-11-05 DIAGNOSIS — G809 Cerebral palsy, unspecified: Secondary | ICD-10-CM | POA: Diagnosis not present

## 2023-11-05 DIAGNOSIS — M546 Pain in thoracic spine: Secondary | ICD-10-CM | POA: Diagnosis not present

## 2023-11-05 DIAGNOSIS — M4140 Neuromuscular scoliosis, site unspecified: Secondary | ICD-10-CM | POA: Diagnosis not present

## 2023-11-05 DIAGNOSIS — M1711 Unilateral primary osteoarthritis, right knee: Secondary | ICD-10-CM | POA: Diagnosis not present

## 2023-11-05 DIAGNOSIS — M5459 Other low back pain: Secondary | ICD-10-CM | POA: Diagnosis not present

## 2023-11-09 DIAGNOSIS — I4891 Unspecified atrial fibrillation: Secondary | ICD-10-CM | POA: Diagnosis not present

## 2023-11-09 DIAGNOSIS — G809 Cerebral palsy, unspecified: Secondary | ICD-10-CM | POA: Diagnosis not present

## 2023-11-09 DIAGNOSIS — M5459 Other low back pain: Secondary | ICD-10-CM | POA: Diagnosis not present

## 2023-11-09 DIAGNOSIS — M1711 Unilateral primary osteoarthritis, right knee: Secondary | ICD-10-CM | POA: Diagnosis not present

## 2023-11-09 DIAGNOSIS — E890 Postprocedural hypothyroidism: Secondary | ICD-10-CM | POA: Diagnosis not present

## 2023-11-09 DIAGNOSIS — M4140 Neuromuscular scoliosis, site unspecified: Secondary | ICD-10-CM | POA: Diagnosis not present

## 2023-11-09 DIAGNOSIS — M546 Pain in thoracic spine: Secondary | ICD-10-CM | POA: Diagnosis not present

## 2023-11-12 DIAGNOSIS — M1711 Unilateral primary osteoarthritis, right knee: Secondary | ICD-10-CM | POA: Diagnosis not present

## 2023-11-12 DIAGNOSIS — M5459 Other low back pain: Secondary | ICD-10-CM | POA: Diagnosis not present

## 2023-11-12 DIAGNOSIS — M4140 Neuromuscular scoliosis, site unspecified: Secondary | ICD-10-CM | POA: Diagnosis not present

## 2023-11-12 DIAGNOSIS — G809 Cerebral palsy, unspecified: Secondary | ICD-10-CM | POA: Diagnosis not present

## 2023-11-12 DIAGNOSIS — M546 Pain in thoracic spine: Secondary | ICD-10-CM | POA: Diagnosis not present

## 2023-11-16 DIAGNOSIS — M546 Pain in thoracic spine: Secondary | ICD-10-CM | POA: Diagnosis not present

## 2023-11-16 DIAGNOSIS — M4140 Neuromuscular scoliosis, site unspecified: Secondary | ICD-10-CM | POA: Diagnosis not present

## 2023-11-16 DIAGNOSIS — M5459 Other low back pain: Secondary | ICD-10-CM | POA: Diagnosis not present

## 2023-11-16 DIAGNOSIS — G809 Cerebral palsy, unspecified: Secondary | ICD-10-CM | POA: Diagnosis not present

## 2023-11-16 DIAGNOSIS — M1711 Unilateral primary osteoarthritis, right knee: Secondary | ICD-10-CM | POA: Diagnosis not present

## 2023-11-19 DIAGNOSIS — M4140 Neuromuscular scoliosis, site unspecified: Secondary | ICD-10-CM | POA: Diagnosis not present

## 2023-11-19 DIAGNOSIS — M1711 Unilateral primary osteoarthritis, right knee: Secondary | ICD-10-CM | POA: Diagnosis not present

## 2023-11-19 DIAGNOSIS — G809 Cerebral palsy, unspecified: Secondary | ICD-10-CM | POA: Diagnosis not present

## 2023-11-19 DIAGNOSIS — M5459 Other low back pain: Secondary | ICD-10-CM | POA: Diagnosis not present

## 2023-11-19 DIAGNOSIS — M546 Pain in thoracic spine: Secondary | ICD-10-CM | POA: Diagnosis not present

## 2023-11-23 DIAGNOSIS — L84 Corns and callosities: Secondary | ICD-10-CM | POA: Diagnosis not present

## 2023-11-23 DIAGNOSIS — G809 Cerebral palsy, unspecified: Secondary | ICD-10-CM | POA: Diagnosis not present

## 2023-11-23 DIAGNOSIS — Z6829 Body mass index (BMI) 29.0-29.9, adult: Secondary | ICD-10-CM | POA: Diagnosis not present

## 2023-11-25 DIAGNOSIS — M722 Plantar fascial fibromatosis: Secondary | ICD-10-CM | POA: Diagnosis not present

## 2023-11-26 DIAGNOSIS — M546 Pain in thoracic spine: Secondary | ICD-10-CM | POA: Diagnosis not present

## 2023-11-26 DIAGNOSIS — M5459 Other low back pain: Secondary | ICD-10-CM | POA: Diagnosis not present

## 2023-11-26 DIAGNOSIS — G809 Cerebral palsy, unspecified: Secondary | ICD-10-CM | POA: Diagnosis not present

## 2023-11-26 DIAGNOSIS — M4140 Neuromuscular scoliosis, site unspecified: Secondary | ICD-10-CM | POA: Diagnosis not present

## 2023-11-26 DIAGNOSIS — M1711 Unilateral primary osteoarthritis, right knee: Secondary | ICD-10-CM | POA: Diagnosis not present

## 2023-11-28 DIAGNOSIS — I4891 Unspecified atrial fibrillation: Secondary | ICD-10-CM | POA: Diagnosis not present

## 2023-11-28 DIAGNOSIS — I4819 Other persistent atrial fibrillation: Secondary | ICD-10-CM | POA: Diagnosis not present

## 2023-11-30 DIAGNOSIS — G809 Cerebral palsy, unspecified: Secondary | ICD-10-CM | POA: Diagnosis not present

## 2023-11-30 DIAGNOSIS — M4140 Neuromuscular scoliosis, site unspecified: Secondary | ICD-10-CM | POA: Diagnosis not present

## 2023-11-30 DIAGNOSIS — M1711 Unilateral primary osteoarthritis, right knee: Secondary | ICD-10-CM | POA: Diagnosis not present

## 2023-11-30 DIAGNOSIS — M546 Pain in thoracic spine: Secondary | ICD-10-CM | POA: Diagnosis not present

## 2023-11-30 DIAGNOSIS — M5459 Other low back pain: Secondary | ICD-10-CM | POA: Diagnosis not present

## 2023-12-01 DIAGNOSIS — I4891 Unspecified atrial fibrillation: Secondary | ICD-10-CM | POA: Diagnosis not present

## 2023-12-01 DIAGNOSIS — E782 Mixed hyperlipidemia: Secondary | ICD-10-CM | POA: Diagnosis not present

## 2023-12-01 DIAGNOSIS — M47897 Other spondylosis, lumbosacral region: Secondary | ICD-10-CM | POA: Diagnosis not present

## 2023-12-01 DIAGNOSIS — I4819 Other persistent atrial fibrillation: Secondary | ICD-10-CM | POA: Diagnosis not present

## 2023-12-01 DIAGNOSIS — E039 Hypothyroidism, unspecified: Secondary | ICD-10-CM | POA: Diagnosis not present

## 2023-12-01 DIAGNOSIS — M1711 Unilateral primary osteoarthritis, right knee: Secondary | ICD-10-CM | POA: Diagnosis not present

## 2023-12-02 DIAGNOSIS — M1711 Unilateral primary osteoarthritis, right knee: Secondary | ICD-10-CM | POA: Diagnosis not present

## 2023-12-02 DIAGNOSIS — M546 Pain in thoracic spine: Secondary | ICD-10-CM | POA: Diagnosis not present

## 2023-12-02 DIAGNOSIS — M4140 Neuromuscular scoliosis, site unspecified: Secondary | ICD-10-CM | POA: Diagnosis not present

## 2023-12-02 DIAGNOSIS — M5459 Other low back pain: Secondary | ICD-10-CM | POA: Diagnosis not present

## 2023-12-02 DIAGNOSIS — G809 Cerebral palsy, unspecified: Secondary | ICD-10-CM | POA: Diagnosis not present

## 2023-12-07 DIAGNOSIS — M1711 Unilateral primary osteoarthritis, right knee: Secondary | ICD-10-CM | POA: Diagnosis not present

## 2023-12-07 DIAGNOSIS — G809 Cerebral palsy, unspecified: Secondary | ICD-10-CM | POA: Diagnosis not present

## 2023-12-07 DIAGNOSIS — M4140 Neuromuscular scoliosis, site unspecified: Secondary | ICD-10-CM | POA: Diagnosis not present

## 2023-12-07 DIAGNOSIS — M5459 Other low back pain: Secondary | ICD-10-CM | POA: Diagnosis not present

## 2023-12-07 DIAGNOSIS — M546 Pain in thoracic spine: Secondary | ICD-10-CM | POA: Diagnosis not present

## 2023-12-09 DIAGNOSIS — G809 Cerebral palsy, unspecified: Secondary | ICD-10-CM | POA: Diagnosis not present

## 2023-12-09 DIAGNOSIS — M1711 Unilateral primary osteoarthritis, right knee: Secondary | ICD-10-CM | POA: Diagnosis not present

## 2023-12-09 DIAGNOSIS — M546 Pain in thoracic spine: Secondary | ICD-10-CM | POA: Diagnosis not present

## 2023-12-09 DIAGNOSIS — M25562 Pain in left knee: Secondary | ICD-10-CM | POA: Diagnosis not present

## 2023-12-09 DIAGNOSIS — M5459 Other low back pain: Secondary | ICD-10-CM | POA: Diagnosis not present

## 2023-12-09 DIAGNOSIS — M4140 Neuromuscular scoliosis, site unspecified: Secondary | ICD-10-CM | POA: Diagnosis not present

## 2023-12-14 DIAGNOSIS — M4140 Neuromuscular scoliosis, site unspecified: Secondary | ICD-10-CM | POA: Diagnosis not present

## 2023-12-14 DIAGNOSIS — M546 Pain in thoracic spine: Secondary | ICD-10-CM | POA: Diagnosis not present

## 2023-12-14 DIAGNOSIS — M5459 Other low back pain: Secondary | ICD-10-CM | POA: Diagnosis not present

## 2023-12-14 DIAGNOSIS — M1711 Unilateral primary osteoarthritis, right knee: Secondary | ICD-10-CM | POA: Diagnosis not present

## 2023-12-14 DIAGNOSIS — G809 Cerebral palsy, unspecified: Secondary | ICD-10-CM | POA: Diagnosis not present

## 2023-12-15 ENCOUNTER — Telehealth: Payer: Self-pay

## 2023-12-15 NOTE — Telephone Encounter (Signed)
 Left message to call back.

## 2023-12-17 DIAGNOSIS — M546 Pain in thoracic spine: Secondary | ICD-10-CM | POA: Diagnosis not present

## 2023-12-17 DIAGNOSIS — G809 Cerebral palsy, unspecified: Secondary | ICD-10-CM | POA: Diagnosis not present

## 2023-12-17 DIAGNOSIS — M4140 Neuromuscular scoliosis, site unspecified: Secondary | ICD-10-CM | POA: Diagnosis not present

## 2023-12-17 DIAGNOSIS — M1711 Unilateral primary osteoarthritis, right knee: Secondary | ICD-10-CM | POA: Diagnosis not present

## 2023-12-17 DIAGNOSIS — M5459 Other low back pain: Secondary | ICD-10-CM | POA: Diagnosis not present

## 2023-12-21 DIAGNOSIS — M5459 Other low back pain: Secondary | ICD-10-CM | POA: Diagnosis not present

## 2023-12-21 DIAGNOSIS — G809 Cerebral palsy, unspecified: Secondary | ICD-10-CM | POA: Diagnosis not present

## 2023-12-21 DIAGNOSIS — M1711 Unilateral primary osteoarthritis, right knee: Secondary | ICD-10-CM | POA: Diagnosis not present

## 2023-12-21 DIAGNOSIS — M4140 Neuromuscular scoliosis, site unspecified: Secondary | ICD-10-CM | POA: Diagnosis not present

## 2023-12-21 DIAGNOSIS — M546 Pain in thoracic spine: Secondary | ICD-10-CM | POA: Diagnosis not present

## 2023-12-21 NOTE — Telephone Encounter (Signed)
 Called to check in with patient, who had LAAO on 10/30/2022. The patient reports doing well with no issues.  The patient understands to call with questions or concerns.  She was grateful for call and agreed with plan.

## 2023-12-23 ENCOUNTER — Other Ambulatory Visit: Payer: Self-pay | Admitting: Family Medicine

## 2023-12-23 DIAGNOSIS — Z1231 Encounter for screening mammogram for malignant neoplasm of breast: Secondary | ICD-10-CM

## 2023-12-24 DIAGNOSIS — M546 Pain in thoracic spine: Secondary | ICD-10-CM | POA: Diagnosis not present

## 2023-12-24 DIAGNOSIS — M5459 Other low back pain: Secondary | ICD-10-CM | POA: Diagnosis not present

## 2023-12-24 DIAGNOSIS — M1711 Unilateral primary osteoarthritis, right knee: Secondary | ICD-10-CM | POA: Diagnosis not present

## 2023-12-24 DIAGNOSIS — G809 Cerebral palsy, unspecified: Secondary | ICD-10-CM | POA: Diagnosis not present

## 2023-12-24 DIAGNOSIS — M4140 Neuromuscular scoliosis, site unspecified: Secondary | ICD-10-CM | POA: Diagnosis not present

## 2023-12-28 DIAGNOSIS — I4819 Other persistent atrial fibrillation: Secondary | ICD-10-CM | POA: Diagnosis not present

## 2023-12-28 DIAGNOSIS — M4140 Neuromuscular scoliosis, site unspecified: Secondary | ICD-10-CM | POA: Diagnosis not present

## 2023-12-28 DIAGNOSIS — I4891 Unspecified atrial fibrillation: Secondary | ICD-10-CM | POA: Diagnosis not present

## 2023-12-28 DIAGNOSIS — M1711 Unilateral primary osteoarthritis, right knee: Secondary | ICD-10-CM | POA: Diagnosis not present

## 2023-12-28 DIAGNOSIS — M5459 Other low back pain: Secondary | ICD-10-CM | POA: Diagnosis not present

## 2023-12-28 DIAGNOSIS — G809 Cerebral palsy, unspecified: Secondary | ICD-10-CM | POA: Diagnosis not present

## 2023-12-28 DIAGNOSIS — M546 Pain in thoracic spine: Secondary | ICD-10-CM | POA: Diagnosis not present

## 2023-12-30 DIAGNOSIS — H1849 Other corneal degeneration: Secondary | ICD-10-CM | POA: Diagnosis not present

## 2023-12-30 DIAGNOSIS — H04123 Dry eye syndrome of bilateral lacrimal glands: Secondary | ICD-10-CM | POA: Diagnosis not present

## 2023-12-30 DIAGNOSIS — Z961 Presence of intraocular lens: Secondary | ICD-10-CM | POA: Diagnosis not present

## 2023-12-30 DIAGNOSIS — H11042 Peripheral pterygium, stationary, left eye: Secondary | ICD-10-CM | POA: Diagnosis not present

## 2023-12-31 DIAGNOSIS — M1711 Unilateral primary osteoarthritis, right knee: Secondary | ICD-10-CM | POA: Diagnosis not present

## 2023-12-31 DIAGNOSIS — G809 Cerebral palsy, unspecified: Secondary | ICD-10-CM | POA: Diagnosis not present

## 2023-12-31 DIAGNOSIS — M4140 Neuromuscular scoliosis, site unspecified: Secondary | ICD-10-CM | POA: Diagnosis not present

## 2023-12-31 DIAGNOSIS — M5459 Other low back pain: Secondary | ICD-10-CM | POA: Diagnosis not present

## 2023-12-31 DIAGNOSIS — M546 Pain in thoracic spine: Secondary | ICD-10-CM | POA: Diagnosis not present

## 2024-01-01 DIAGNOSIS — I4891 Unspecified atrial fibrillation: Secondary | ICD-10-CM | POA: Diagnosis not present

## 2024-01-01 DIAGNOSIS — M1711 Unilateral primary osteoarthritis, right knee: Secondary | ICD-10-CM | POA: Diagnosis not present

## 2024-01-01 DIAGNOSIS — E782 Mixed hyperlipidemia: Secondary | ICD-10-CM | POA: Diagnosis not present

## 2024-01-01 DIAGNOSIS — M47897 Other spondylosis, lumbosacral region: Secondary | ICD-10-CM | POA: Diagnosis not present

## 2024-01-01 DIAGNOSIS — I4819 Other persistent atrial fibrillation: Secondary | ICD-10-CM | POA: Diagnosis not present

## 2024-01-01 DIAGNOSIS — E039 Hypothyroidism, unspecified: Secondary | ICD-10-CM | POA: Diagnosis not present

## 2024-01-04 DIAGNOSIS — M546 Pain in thoracic spine: Secondary | ICD-10-CM | POA: Diagnosis not present

## 2024-01-04 DIAGNOSIS — M4140 Neuromuscular scoliosis, site unspecified: Secondary | ICD-10-CM | POA: Diagnosis not present

## 2024-01-04 DIAGNOSIS — M5459 Other low back pain: Secondary | ICD-10-CM | POA: Diagnosis not present

## 2024-01-04 DIAGNOSIS — M1711 Unilateral primary osteoarthritis, right knee: Secondary | ICD-10-CM | POA: Diagnosis not present

## 2024-01-04 DIAGNOSIS — G809 Cerebral palsy, unspecified: Secondary | ICD-10-CM | POA: Diagnosis not present

## 2024-01-07 DIAGNOSIS — G809 Cerebral palsy, unspecified: Secondary | ICD-10-CM | POA: Diagnosis not present

## 2024-01-07 DIAGNOSIS — M4140 Neuromuscular scoliosis, site unspecified: Secondary | ICD-10-CM | POA: Diagnosis not present

## 2024-01-07 DIAGNOSIS — M546 Pain in thoracic spine: Secondary | ICD-10-CM | POA: Diagnosis not present

## 2024-01-07 DIAGNOSIS — M5459 Other low back pain: Secondary | ICD-10-CM | POA: Diagnosis not present

## 2024-01-07 DIAGNOSIS — M1711 Unilateral primary osteoarthritis, right knee: Secondary | ICD-10-CM | POA: Diagnosis not present

## 2024-01-08 ENCOUNTER — Encounter: Payer: Self-pay | Admitting: Cardiology

## 2024-01-11 DIAGNOSIS — M546 Pain in thoracic spine: Secondary | ICD-10-CM | POA: Diagnosis not present

## 2024-01-11 DIAGNOSIS — G809 Cerebral palsy, unspecified: Secondary | ICD-10-CM | POA: Diagnosis not present

## 2024-01-11 DIAGNOSIS — M5459 Other low back pain: Secondary | ICD-10-CM | POA: Diagnosis not present

## 2024-01-11 DIAGNOSIS — M1711 Unilateral primary osteoarthritis, right knee: Secondary | ICD-10-CM | POA: Diagnosis not present

## 2024-01-11 DIAGNOSIS — M4140 Neuromuscular scoliosis, site unspecified: Secondary | ICD-10-CM | POA: Diagnosis not present

## 2024-01-14 DIAGNOSIS — G809 Cerebral palsy, unspecified: Secondary | ICD-10-CM | POA: Diagnosis not present

## 2024-01-14 DIAGNOSIS — M5459 Other low back pain: Secondary | ICD-10-CM | POA: Diagnosis not present

## 2024-01-14 DIAGNOSIS — M1711 Unilateral primary osteoarthritis, right knee: Secondary | ICD-10-CM | POA: Diagnosis not present

## 2024-01-14 DIAGNOSIS — M4140 Neuromuscular scoliosis, site unspecified: Secondary | ICD-10-CM | POA: Diagnosis not present

## 2024-01-14 DIAGNOSIS — M546 Pain in thoracic spine: Secondary | ICD-10-CM | POA: Diagnosis not present

## 2024-01-18 DIAGNOSIS — M4140 Neuromuscular scoliosis, site unspecified: Secondary | ICD-10-CM | POA: Diagnosis not present

## 2024-01-18 DIAGNOSIS — G809 Cerebral palsy, unspecified: Secondary | ICD-10-CM | POA: Diagnosis not present

## 2024-01-18 DIAGNOSIS — M1711 Unilateral primary osteoarthritis, right knee: Secondary | ICD-10-CM | POA: Diagnosis not present

## 2024-01-18 DIAGNOSIS — M546 Pain in thoracic spine: Secondary | ICD-10-CM | POA: Diagnosis not present

## 2024-01-18 DIAGNOSIS — M5459 Other low back pain: Secondary | ICD-10-CM | POA: Diagnosis not present

## 2024-01-21 ENCOUNTER — Ambulatory Visit
Admission: RE | Admit: 2024-01-21 | Discharge: 2024-01-21 | Disposition: A | Discharge status: Home / Self Care | Source: Ambulatory Visit

## 2024-01-21 DIAGNOSIS — Z1231 Encounter for screening mammogram for malignant neoplasm of breast: Secondary | ICD-10-CM

## 2024-01-21 DIAGNOSIS — M5459 Other low back pain: Secondary | ICD-10-CM | POA: Diagnosis not present

## 2024-01-21 DIAGNOSIS — M546 Pain in thoracic spine: Secondary | ICD-10-CM | POA: Diagnosis not present

## 2024-01-21 DIAGNOSIS — M4140 Neuromuscular scoliosis, site unspecified: Secondary | ICD-10-CM | POA: Diagnosis not present

## 2024-01-21 DIAGNOSIS — G809 Cerebral palsy, unspecified: Secondary | ICD-10-CM | POA: Diagnosis not present

## 2024-01-21 DIAGNOSIS — M1711 Unilateral primary osteoarthritis, right knee: Secondary | ICD-10-CM | POA: Diagnosis not present

## 2024-01-25 DIAGNOSIS — M546 Pain in thoracic spine: Secondary | ICD-10-CM | POA: Diagnosis not present

## 2024-01-25 DIAGNOSIS — G809 Cerebral palsy, unspecified: Secondary | ICD-10-CM | POA: Diagnosis not present

## 2024-01-25 DIAGNOSIS — M4140 Neuromuscular scoliosis, site unspecified: Secondary | ICD-10-CM | POA: Diagnosis not present

## 2024-01-25 DIAGNOSIS — M1711 Unilateral primary osteoarthritis, right knee: Secondary | ICD-10-CM | POA: Diagnosis not present

## 2024-01-25 DIAGNOSIS — M5459 Other low back pain: Secondary | ICD-10-CM | POA: Diagnosis not present

## 2024-01-27 DIAGNOSIS — M546 Pain in thoracic spine: Secondary | ICD-10-CM | POA: Diagnosis not present

## 2024-01-27 DIAGNOSIS — M1711 Unilateral primary osteoarthritis, right knee: Secondary | ICD-10-CM | POA: Diagnosis not present

## 2024-01-27 DIAGNOSIS — G809 Cerebral palsy, unspecified: Secondary | ICD-10-CM | POA: Diagnosis not present

## 2024-01-27 DIAGNOSIS — I4891 Unspecified atrial fibrillation: Secondary | ICD-10-CM | POA: Diagnosis not present

## 2024-01-27 DIAGNOSIS — M5459 Other low back pain: Secondary | ICD-10-CM | POA: Diagnosis not present

## 2024-01-27 DIAGNOSIS — I4819 Other persistent atrial fibrillation: Secondary | ICD-10-CM | POA: Diagnosis not present

## 2024-01-27 DIAGNOSIS — M4140 Neuromuscular scoliosis, site unspecified: Secondary | ICD-10-CM | POA: Diagnosis not present

## 2024-01-31 DIAGNOSIS — I4819 Other persistent atrial fibrillation: Secondary | ICD-10-CM | POA: Diagnosis not present

## 2024-01-31 DIAGNOSIS — E039 Hypothyroidism, unspecified: Secondary | ICD-10-CM | POA: Diagnosis not present

## 2024-01-31 DIAGNOSIS — M47897 Other spondylosis, lumbosacral region: Secondary | ICD-10-CM | POA: Diagnosis not present

## 2024-01-31 DIAGNOSIS — E782 Mixed hyperlipidemia: Secondary | ICD-10-CM | POA: Diagnosis not present

## 2024-01-31 DIAGNOSIS — I4891 Unspecified atrial fibrillation: Secondary | ICD-10-CM | POA: Diagnosis not present

## 2024-01-31 DIAGNOSIS — M1711 Unilateral primary osteoarthritis, right knee: Secondary | ICD-10-CM | POA: Diagnosis not present

## 2024-02-01 DIAGNOSIS — G809 Cerebral palsy, unspecified: Secondary | ICD-10-CM | POA: Diagnosis not present

## 2024-02-01 DIAGNOSIS — M4140 Neuromuscular scoliosis, site unspecified: Secondary | ICD-10-CM | POA: Diagnosis not present

## 2024-02-01 DIAGNOSIS — M546 Pain in thoracic spine: Secondary | ICD-10-CM | POA: Diagnosis not present

## 2024-02-01 DIAGNOSIS — M5459 Other low back pain: Secondary | ICD-10-CM | POA: Diagnosis not present

## 2024-02-01 DIAGNOSIS — M1711 Unilateral primary osteoarthritis, right knee: Secondary | ICD-10-CM | POA: Diagnosis not present

## 2024-02-04 DIAGNOSIS — M1711 Unilateral primary osteoarthritis, right knee: Secondary | ICD-10-CM | POA: Diagnosis not present

## 2024-02-04 DIAGNOSIS — M5459 Other low back pain: Secondary | ICD-10-CM | POA: Diagnosis not present

## 2024-02-04 DIAGNOSIS — M4140 Neuromuscular scoliosis, site unspecified: Secondary | ICD-10-CM | POA: Diagnosis not present

## 2024-02-04 DIAGNOSIS — G809 Cerebral palsy, unspecified: Secondary | ICD-10-CM | POA: Diagnosis not present

## 2024-02-04 DIAGNOSIS — M546 Pain in thoracic spine: Secondary | ICD-10-CM | POA: Diagnosis not present

## 2024-02-23 ENCOUNTER — Encounter: Payer: Self-pay | Admitting: Cardiology

## 2024-02-26 NOTE — Telephone Encounter (Signed)
 Agree, thanks Reena

## 2024-02-28 NOTE — Progress Notes (Unsigned)
 " Cardiology Office Note:    Date:  03/02/2024   ID:  Holly  Kelbie Mcconnell, DOB 1945-08-24, MRN [id number]  PCP:  Aisha Harvey, MD  Cardiologist:  Lonni LITTIE Nanas, MD  Electrophysiologist:  OLE ONEIDA HOLTS, MD   Referring MD: Aisha Harvey, MD   Chief Complaint  Patient presents with   Atrial Flutter    History of Present Illness:    Holly  Meghen Mcconnell is a 78 y.o. female with a hx of atrial flutter, hypertension, cerebral palsy who presents for follow-up.  Wore 30-day monitor 03/2018, had episode of possible atach vs aflutter, lasted 10 minutes.  Suspecting likely atrial tachycardia and she was started on nadolol  and has had no further palpitations.  Switched in 08/2021 to metoprolol .    Zio patch x 14 days on 03/12/2022 showed 9% atrial flutter burden, average rate 134 bpm with longest episode lasting 1 hour with average rate 147 bpm, occasional PVCs (3% of beats).  Echocardiogram 03/20/2022 showed normal biventricular function, no significant valvular disease.  Referred to Dr. Holts in EP, underwent successful A-fib/flutter ablation 08/12/2022.  Underwent Watchman placement 10/30/2022.  Echocardiogram 09/24/2022 showed normal biventricular function, no significant valvular disease.  Since last clinic visit, she reports she is doing well. Denies any chest pain, dyspnea, lightheadedness, syncope,  or palpitations.  Does report some lower extremity edema.    BP Readings from Last 3 Encounters:  03/02/24 119/70  08/25/23 120/66  06/09/23 (!) 104/56   Wt Readings from Last 3 Encounters:  03/02/24 151 lb 1.6 oz (68.5 kg)  08/25/23 146 lb 12.8 oz (66.6 kg)  06/09/23 147 lb (66.7 kg)     Past Medical History:  Diagnosis Date   Arthritis    Atrial fibrillation (HCC)    Cerebral palsy (HCC)    Mild symptoms.   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 Holts    Past Surgical  History:  Procedure Laterality Date   ATRIAL FIBRILLATION ABLATION N/A 08/12/2022   Procedure: ATRIAL FIBRILLATION ABLATION;  Surgeon: Holts Ole ONEIDA, MD;  Location: [facility] INVASIVE CV LAB;  Service: Cardiovascular;  Laterality: N/A;   CHOLECYSTECTOMY  1989   LEFT ATRIAL APPENDAGE OCCLUSION N/A 10/30/2022   Procedure: LEFT ATRIAL APPENDAGE OCCLUSION;  Surgeon: Holts Ole ONEIDA, MD;  Location: [facility] INVASIVE CV LAB;  Service: Cardiovascular;  Laterality: N/A;   minute man     in back per pt, screw in spine   TEE WITHOUT CARDIOVERSION N/A 08/12/2022   Procedure: TRANSESOPHAGEAL ECHOCARDIOGRAM;  Surgeon: Holts Ole ONEIDA, MD;  Location: [facility] INVASIVE CV LAB;  Service: Cardiovascular;  Laterality: N/A;   TEE WITHOUT CARDIOVERSION N/A 10/30/2022   Procedure: TRANSESOPHAGEAL ECHOCARDIOGRAM;  Surgeon: Holts Ole ONEIDA, MD;  Location: [facility] INVASIVE CV LAB;  Service: Cardiovascular;  Laterality: N/A;   TEMPOROMANDIBULAR JOINT ARTHROPLASTY     Thyroid  nodule resection  2005   TOTAL ABDOMINAL HYSTERECTOMY W/ BILATERAL SALPINGOOPHORECTOMY  1982   TOTAL KNEE ARTHROPLASTY Right 06/09/2023   Procedure: ARTHROPLASTY, KNEE, TOTAL;  Surgeon: Sheril Coy, MD;  Location: [facility] ORS;  Service: Orthopedics;  Laterality: Right;  RIGHT TOTAL KNEE ARTHROPLASTY    Current Medications: Current Meds  Medication Sig   atorvastatin (LIPITOR) 20 MG tablet Take 20 mg by mouth at bedtime.   Calcium Carbonate-Vit D-Min (CALCIUM 1200 PO) Take 1,200 mg by mouth every evening.   ibandronate (BONIVA) 150 MG tablet Take 150 mg by mouth every 30 (  thirty) days.   levothyroxine  (SYNTHROID , LEVOTHROID) 75 MCG tablet Take 37.5-75 mcg by mouth See admin instructions. Take 0.5 tablet (37.5 mcg) by mouth in the morning on Sundays.  Take 1 tablet (75 mcg) by mouth once daily in the morning on all other days.   metoprolol  succinate (TOPROL  XL) 25 MG 24 hr tablet Take 1 tablet (25 mg total) by mouth daily.   Multiple Vitamin (MULTIVITAMIN  WITH MINERALS) TABS tablet Take 1 tablet by mouth every evening.   Polyethyl Glycol-Propyl Glycol (SYSTANE) 0.4-0.3 % GEL ophthalmic gel Place 1 Application into both eyes at bedtime.   Polyethyl Glycol-Propyl Glycol (SYSTANE) 0.4-0.3 % SOLN Place 1 drop into both eyes in the morning and at bedtime.   [DISCONTINUED] estradiol (ESTRACE) 0.1 MG/GM vaginal cream Place 1 Applicatorful vaginally 3 (three) times a week.   [DISCONTINUED] SUMAtriptan (IMITREX) 100 MG tablet Take 100 mg by mouth every 2 (two) hours as needed for migraine. May repeat in 2 hours if headache persists or recurs.   [DISCONTINUED] tiZANidine  (ZANAFLEX ) 2 MG tablet Take 1-2 tablets (2-4 mg total) by mouth every 6 (six) hours as needed for muscle spasms.   Current Facility-Administered Medications for the 03/02/24 encounter (Office Visit) with Kate Lonni CROME, MD  Medication   botulinum toxin Type A  (BOTOX) injection 100 Units   sodium chloride  (PF) 0.9 % injection 2 mL     Allergies:   Clavulanic acid, Nitrofurantoin, Amoxicillin, Nabumetone, Other, Sulfa antibiotics, and Codeine   Social History   Socioeconomic History   Marital status: Married    Spouse name: Not on file   Number of children: 1   Years of education: Not on file   Highest education level: Some college, no degree  Occupational History   Not on file  Tobacco Use   Smoking status: Never   Smokeless tobacco: Never  Vaping Use   Vaping status: Never Used  Substance and Sexual Activity   Alcohol use: Never   Drug use: Never   Sexual activity: Not on file  Other Topics Concern   Not on file  Social History Narrative   She is retired.  Formerly worked in community education officer.  She is married (husband's name is Gerlene -married in January 03, 1967).   They have 1 child and 8 biologic grandkids and one adopted grandkid.  She lives with her husband and is very active.     She does cardio-strength exercises with a trainer roughly 5 days a week for 30+ minutes  at a time.      She walks with a walker and has some mild speech deficits and musculoskeletal deficits from history of cerebral palsy syndrome. -- >  Does have a handicap placard      Social Drivers of Health   Tobacco Use: Low Risk (08/25/2023)   Patient History    Smoking Tobacco Use: Never    Smokeless Tobacco Use: Never    Passive Exposure: Not on file  Financial Resource Strain: Not on file  Food Insecurity: Not on file  Transportation Needs: Not on file  Physical Activity: Not on file  Stress: Not on file  Social Connections: Not on file  Depression (PHQ2-9): Low Risk (05/28/2023)   Depression (PHQ2-9)    PHQ-2 Score: 0  Alcohol Screen: Not on file  Housing: Not on file  Utilities: Not on file  Health Literacy: Not on file     Family History: The patient's family history includes Atrial fibrillation in her maternal grandfather and maternal  uncle; Atrial fibrillation (age of onset: 36) in her mother; Breast cancer (age of onset: 10) in her maternal grandmother; CAD in her mother; COPD in her mother; Colon cancer in her maternal grandmother; Hodgkin's lymphoma in her father; Hypercholesterolemia in her mother; Hyperlipidemia in her sister; Hypertension in her mother and sister; Hypothyroidism in her mother; Lung cancer in her father; Pancreatic cancer in her sister; Stroke in her mother.  ROS:   Please see the history of present illness.     All other systems reviewed and are negative.  EKGs/Labs/Other Studies Reviewed:    The following studies were reviewed today:   EKG:   02/17/2022: Normal sinus rhythm, rate 70, low voltage, no ST abnormalities 03/18/2022: Normal sinus rhythm, rate 75, no ST abnormality 08/25/2023: Normal sinus rhythm, rate 89, no ST abnormalities 03/02/2024: Normal sinus rhythm, rate 81, low voltage, no ST abnormalities  Recent Labs: 06/04/2023: BUN 15; Creatinine, Ser 0.65; Hemoglobin 12.6; Platelets 277; Potassium 4.3; Sodium 143  Recent Lipid  Panel No results found for: CHOL, TRIG, HDL, CHOLHDL, VLDL, LDLCALC, LDLDIRECT  Physical Exam:    VS:  BP 119/70 (BP Location: Right Arm, Patient Position: Sitting, Cuff Size: Normal)   Pulse 81   Ht 4' 11.02 (1.499 m)   Wt 151 lb 1.6 oz (68.5 kg)   SpO2 97%   BMI 30.50 kg/m     Wt Readings from Last 3 Encounters:  03/02/24 151 lb 1.6 oz (68.5 kg)  08/25/23 146 lb 12.8 oz (66.6 kg)  06/09/23 147 lb (66.7 kg)     GEN:   in no acute distress HEENT: Normal NECK: No JVD; No carotid bruits CARDIAC: RRR, no murmurs, rubs, gallops RESPIRATORY:  Clear to auscultation without rales, wheezing or rhonchi  ABDOMEN: Soft, non-tender, non-distended MUSCULOSKELETAL:  trace edema; No deformity  SKIN: Warm and dry NEUROLOGIC:  Alert and oriented x 3 PSYCHIATRIC:  Normal affect   ASSESSMENT:    1. Atrial flutter, unspecified type (HCC)   2. Essential hypertension   3. Hyperlipidemia, unspecified hyperlipidemia type        PLAN:    Atrial flutter: Zio patch x 14 days on 03/12/2022 showed 9% atrial flutter burden, average rate 134 bpm with longest episode lasting 1 hour with average rate 147 bpm, occasional PVCs (3% of beats).  Echocardiogram 03/20/2022 showed normal biventricular function, no significant valvular disease.   -Referred to Dr. Cindie in EP, underwent successful A-fib/flutter ablation 08/12/2022 -Continue toprol -XL 25 mg daily -CHA2DS2-VASc 4 (age x 2, hypertension, female).  She does have a history of falls with cerebral palsy and would prefer to avoid long-term anticoagulation.  Referred to Dr. Cindie and underwent Watchman placement 10/30/2022.  She was on Eliquis  through 12/14/2022 and then Plavix  through 05/01/2023, she is now off all anticoagulation/antiplatelets  Hypertension: On Toprol -XL 25 mg daily.  Appears controlled  Hyperlipidemia: On atorvastatin 20 mg daily.  LDL 80 04/2023. Calcium score 0 on preablation CT 08/05/22    RTC in 6  months   Medication Adjustments/Labs and Tests Ordered: Current medicines are reviewed at length with the patient today.  Concerns regarding medicines are outlined above.  Orders Placed This Encounter  Procedures   EKG 12-Lead   No orders of the defined types were placed in this encounter.   Patient Instructions  Medication Instructions:  Your physician recommends that you continue on your current medications as directed. Please refer to the Current Medication list given to you today.  *If you need a refill on  your cardiac medications before your next appointment, please call your pharmacy*  Lab Work: NONE If you have labs (blood work) drawn today and your tests are completely normal, you will receive your results only by: MyChart Message (if you have MyChart) OR A paper copy in the mail If you have any lab test that is abnormal or we need to change your treatment, we will call you to review the results.  Testing/Procedures: NONE  Follow-Up: At [facility], you and your health needs are our priority.  As part of our continuing mission to provide you with exceptional heart care, our providers are all part of one team.  This team includes your primary Cardiologist (physician) and Advanced Practice Providers or APPs (Physician Assistants and Nurse Practitioners) who all work together to provide you with the care you need, when you need it.  Your next appointment:   6 Months  Provider:   Dr. Kate  We recommend signing up for the patient portal called MyChart.  Sign up information is provided on this After Visit Summary.  MyChart is used to connect with patients for Virtual Visits (Telemedicine).  Patients are able to view lab/test results, encounter notes, upcoming appointments, etc.  Non-urgent messages can be sent to your provider as well.   To learn more about what you can do with MyChart, go to [email or web address].   Other Instructions none            Signed, Lonni LITTIE Kate, MD  03/02/2024 9:29 AM    [facility] "

## 2024-03-02 ENCOUNTER — Ambulatory Visit: Admitting: Cardiology

## 2024-03-02 VITALS — BP 119/70 | HR 81 | Ht 59.02 in | Wt 151.1 lb

## 2024-03-02 DIAGNOSIS — E785 Hyperlipidemia, unspecified: Secondary | ICD-10-CM

## 2024-03-02 DIAGNOSIS — I10 Essential (primary) hypertension: Secondary | ICD-10-CM | POA: Diagnosis not present

## 2024-03-02 DIAGNOSIS — I4892 Unspecified atrial flutter: Secondary | ICD-10-CM | POA: Diagnosis not present

## 2024-03-02 NOTE — Patient Instructions (Signed)
 Medication Instructions:  Your physician recommends that you continue on your current medications as directed. Please refer to the Current Medication list given to you today.  *If you need a refill on your cardiac medications before your next appointment, please call your pharmacy*  Lab Work: NONE If you have labs (blood work) drawn today and your tests are completely normal, you will receive your results only by: MyChart Message (if you have MyChart) OR A paper copy in the mail If you have any lab test that is abnormal or we need to change your treatment, we will call you to review the results.  Testing/Procedures: NONE  Follow-Up: At [facility], you and your health needs are our priority.  As part of our continuing mission to provide you with exceptional heart care, our providers are all part of one team.  This team includes your primary Cardiologist (physician) and Advanced Practice Providers or APPs (Physician Assistants and Nurse Practitioners) who all work together to provide you with the care you need, when you need it.  Your next appointment:   6 Months  Provider:   Dr. Kate  We recommend signing up for the patient portal called MyChart.  Sign up information is provided on this After Visit Summary.  MyChart is used to connect with patients for Virtual Visits (Telemedicine).  Patients are able to view lab/test results, encounter notes, upcoming appointments, etc.  Non-urgent messages can be sent to your provider as well.   To learn more about what you can do with MyChart, go to [email or web address].   Other Instructions none

## 2024-06-14 ENCOUNTER — Ambulatory Visit (HOSPITAL_BASED_OUTPATIENT_CLINIC_OR_DEPARTMENT_OTHER)

## 2024-08-26 ENCOUNTER — Ambulatory Visit: Admitting: Cardiology
